# Patient Record
Sex: Female | Born: 1965
Health system: Southern US, Community
[De-identification: ages and names within clinical notes are randomized; demographics above are authoritative.]

## PROBLEM LIST (undated history)

## (undated) DIAGNOSIS — E785 Hyperlipidemia, unspecified: Secondary | ICD-10-CM

## (undated) DIAGNOSIS — T7840XA Allergy, unspecified, initial encounter: Secondary | ICD-10-CM

## (undated) DIAGNOSIS — M1990 Unspecified osteoarthritis, unspecified site: Secondary | ICD-10-CM

## (undated) HISTORY — DX: Unspecified osteoarthritis, unspecified site: M19.90

## (undated) HISTORY — DX: Allergy, unspecified, initial encounter: T78.40XA

## (undated) HISTORY — DX: Hyperlipidemia, unspecified: E78.5

## (undated) HISTORY — PX: TOTAL ABDOMINAL HYSTERECTOMY W/ BILATERAL SALPINGOOPHORECTOMY: SHX83

---

## 2015-03-16 ENCOUNTER — Ambulatory Visit (INDEPENDENT_AMBULATORY_CARE_PROVIDER_SITE_OTHER): Payer: BLUE CROSS/BLUE SHIELD | Admitting: Family Medicine

## 2015-03-16 ENCOUNTER — Encounter: Payer: Self-pay | Admitting: Family Medicine

## 2015-03-16 VITALS — BP 125/90 | HR 80 | Temp 98.4°F | Ht 67.0 in | Wt 189.0 lb

## 2015-03-16 DIAGNOSIS — H6090 Unspecified otitis externa, unspecified ear: Secondary | ICD-10-CM | POA: Insufficient documentation

## 2015-03-16 DIAGNOSIS — H6091 Unspecified otitis externa, right ear: Secondary | ICD-10-CM

## 2015-03-16 DIAGNOSIS — Z23 Encounter for immunization: Secondary | ICD-10-CM | POA: Diagnosis not present

## 2015-03-16 DIAGNOSIS — Z720 Tobacco use: Secondary | ICD-10-CM

## 2015-03-16 DIAGNOSIS — F17200 Nicotine dependence, unspecified, uncomplicated: Secondary | ICD-10-CM | POA: Insufficient documentation

## 2015-03-16 MED ORDER — CIPROFLOXACIN-DEXAMETHASONE 0.3-0.1 % OT SUSP: 4.0000 [drp] | Freq: Two times a day (BID) | OTIC | Status: AC

## 2015-03-16 NOTE — Progress Notes (Signed)
Erica Walters is a 49 y.o. female who presents to [facility]  today for right ear drainage. Symptoms have been present for a few days. She has mild pain. Symptoms are consistent with previous episodes of draining ear. She notes that she has a ruptured tympanic membrane chronically in the right ear and occasionally gets infected and causes drainage. She has not had any treatment. No fevers chills nausea vomiting or diarrhea.  She is due for a Tdap vaccine today and a flu vaccine today. She declines flu vaccine but excepts Tdap. She works in a pharmacy.   History reviewed. No pertinent past medical history. History reviewed. No pertinent past surgical history. Social History  Substance Use Topics  . Smoking status: Current Every Day Smoker  . Smokeless tobacco: Not on file  . Alcohol Use: 0.0 oz/week    0 Standard drinks or equivalent per week   family history is not on file.  ROS as above Medications: Current Outpatient Prescriptions  Medication Sig Dispense Refill  . ciprofloxacin-dexamethasone (CIPRODEX) otic suspension Place 4 drops into the right ear 2 (two) times daily. 1 mL 1  . Ibuprofen (ADVIL) 200 MG CAPS Take by mouth.     No current facility-administered medications for this visit.   No Known Allergies   Exam:  BP 125/90 mmHg  Pulse 80  Temp(Src) 98.4 F (36.9 C) (Oral)  Ht 5\' 7"  (1.702 m)  Wt 189 lb (85.73 kg)  BMI 29.59 kg/m2 Gen: Well NAD HEENT: EOMI,  MMM right ear yellowish drainage. Tympanic membrane partially visible. Left ear tympanic membranes and ear canals normal. Nontender mastoids bilaterally. Lungs: Normal work of breathing. CTABL Heart: RRR no MRG Abd: NABS, Soft. Nondistended, Nontender Exts: Brisk capillary refill, warm and well perfused.   No results found for this or any previous visit (from the past 24 hour(s)). No results found.   Please see individual assessment and plan sections.  Tdap vaccine  given prior to discharge

## 2015-03-16 NOTE — Patient Instructions (Signed)
Thank you for coming in today.' Use the ear drops as directed.  Call if not better.  Return in the near future for a wellness visit where we will also do a Pap smear.  Work on smoking cessation.  Welcome to the practice.    Otitis Externa Otitis externa is a bacterial or fungal infection of the outer ear canal. This is the area from the eardrum to the outside of the ear. Otitis externa is sometimes called "swimmer's ear." CAUSES  Possible causes of infection include:  Swimming in dirty water.  Moisture remaining in the ear after swimming or bathing.  Mild injury (trauma) to the ear.  Objects stuck in the ear (foreign body).  Cuts or scrapes (abrasions) on the outside of the ear. SIGNS AND SYMPTOMS  The first symptom of infection is often itching in the ear canal. Later signs and symptoms may include swelling and redness of the ear canal, ear pain, and yellowish-white fluid (pus) coming from the ear. The ear pain may be worse when pulling on the earlobe. DIAGNOSIS  Your health care provider will perform a physical exam. A sample of fluid may be taken from the ear and examined for bacteria or fungi. TREATMENT  Antibiotic ear drops are often given for 10 to 14 days. Treatment may also include pain medicine or corticosteroids to reduce itching and swelling. HOME CARE INSTRUCTIONS   Apply antibiotic ear drops to the ear canal as prescribed by your health care provider.  Take medicines only as directed by your health care provider.  If you have diabetes, follow any additional treatment instructions from your health care provider.  Keep all follow-up visits as directed by your health care provider. PREVENTION   Keep your ear dry. Use the corner of a towel to absorb water out of the ear canal after swimming or bathing.  Avoid scratching or putting objects inside your ear. This can damage the ear canal or remove the protective wax that lines the canal. This makes it easier for  bacteria and fungi to grow.  Avoid swimming in lakes, polluted water, or poorly chlorinated pools.  You may use ear drops made of rubbing alcohol and vinegar after swimming. Combine equal parts of white vinegar and alcohol in a bottle. Put 3 or 4 drops into each ear after swimming. SEEK MEDICAL CARE IF:   You have a fever.  Your ear is still red, swollen, painful, or draining pus after 3 days.  Your redness, swelling, or pain gets worse.  You have a severe headache.  You have redness, swelling, pain, or tenderness in the area behind your ear. MAKE SURE YOU:   Understand these instructions.  Will watch your condition.  Will get help right away if you are not doing well or get worse. Document Released: 06/11/2005 Document Revised: 10/26/2013 Document Reviewed: 06/28/2011 [facility] Patient Information 2015 [place], [place]. This information is not intended to replace advice given to you by your health care provider. Make sure you discuss any questions you have with your health care provider.

## 2015-03-16 NOTE — Assessment & Plan Note (Signed)
Treat with Ciprodex. Return if not better.

## 2015-03-16 NOTE — Assessment & Plan Note (Addendum)
Recommend smoking cessation.

## 2015-07-19 ENCOUNTER — Other Ambulatory Visit: Payer: Self-pay | Admitting: Family Medicine

## 2015-07-19 ENCOUNTER — Ambulatory Visit: Payer: BLUE CROSS/BLUE SHIELD | Admitting: Family Medicine

## 2015-07-19 ENCOUNTER — Ambulatory Visit (INDEPENDENT_AMBULATORY_CARE_PROVIDER_SITE_OTHER): Payer: BLUE CROSS/BLUE SHIELD | Admitting: Family Medicine

## 2015-07-19 VITALS — BP 127/80 | HR 92 | Wt 195.0 lb

## 2015-07-19 DIAGNOSIS — N949 Unspecified condition associated with female genital organs and menstrual cycle: Secondary | ICD-10-CM | POA: Diagnosis not present

## 2015-07-19 DIAGNOSIS — N9489 Other specified conditions associated with female genital organs and menstrual cycle: Secondary | ICD-10-CM

## 2015-07-19 DIAGNOSIS — R102 Pelvic and perineal pain: Secondary | ICD-10-CM

## 2015-07-19 DIAGNOSIS — R300 Dysuria: Secondary | ICD-10-CM

## 2015-07-19 LAB — POCT URINALYSIS DIPSTICK
BILIRUBIN UA: NEGATIVE
GLUCOSE UA: NEGATIVE
KETONES UA: NEGATIVE
LEUKOCYTES UA: NEGATIVE
NITRITE UA: NEGATIVE
Protein, UA: NEGATIVE
RBC UA: NEGATIVE
SPEC GRAV UA: 1.025
Urobilinogen, UA: 0.2
pH, UA: 6.5

## 2015-07-19 LAB — WET PREP FOR TRICH, YEAST, CLUE
Clue Cells Wet Prep HPF POC: NONE SEEN
Trich, Wet Prep: NONE SEEN
YEAST WET PREP: NONE SEEN

## 2015-07-19 MED ORDER — POLYETHYLENE GLYCOL 3350 17 GM/SCOOP PO POWD: 17.0000 g | Freq: Every day | ORAL | Status: DC

## 2015-07-19 NOTE — Patient Instructions (Addendum)
Thank you for coming in today. If your belly pain worsens, or you have high fever, bad vomiting, blood in your stool or black tarry stool go to the Emergency Room.  Take miralax for constipation.  Get labs today.  You should be contacted by the radiology department about the ultrasound.   Return if not better.    Abdominal Pain, Adult Many things can cause abdominal pain. Usually, abdominal pain is not caused by a disease and will improve without treatment. It can often be observed and treated at home. Your health care provider will do a physical exam and possibly order blood tests and X-rays to help determine the seriousness of your pain. However, in many cases, more time must pass before a clear cause of the pain can be found. Before that point, your health care provider may not know if you need more testing or further treatment. HOME CARE INSTRUCTIONS Monitor your abdominal pain for any changes. The following actions may help to alleviate any discomfort you are experiencing:  Only take over-the-counter or prescription medicines as directed by your health care provider.  Do not take laxatives unless directed to do so by your health care provider.  Try a clear liquid diet (broth, tea, or water) as directed by your health care provider. Slowly move to a bland diet as tolerated. SEEK MEDICAL CARE IF:  You have unexplained abdominal pain.  You have abdominal pain associated with nausea or diarrhea.  You have pain when you urinate or have a bowel movement.  You experience abdominal pain that wakes you in the night.  You have abdominal pain that is worsened or improved by eating food.  You have abdominal pain that is worsened with eating fatty foods.  You have a fever. SEEK IMMEDIATE MEDICAL CARE IF:  Your pain does not go away within 2 hours.  You keep throwing up (vomiting).  Your pain is felt only in portions of the abdomen, such as the right side or the left lower portion of  the abdomen.  You pass bloody or black tarry stools. MAKE SURE YOU:  Understand these instructions.  Will watch your condition.  Will get help right away if you are not doing well or get worse.   This information is not intended to replace advice given to you by your health care provider. Make sure you discuss any questions you have with your health care provider.   Document Released: 03/21/2005 Document Revised: 03/02/2015 Document Reviewed: 02/18/2013 Elsevier Interactive Patient Education [employer].

## 2015-07-19 NOTE — Assessment & Plan Note (Signed)
Pelvis discomfort and pain. Etiology is unclear. The uterus is large on bimanual exam. Plan for wet prep, GC chlamydia testing. Additionally obtain CBC, CMP, HIV and lipase and RPR. Additionally will obtain a pelvic ultrasound. Additionally we'll treat empirically for constipation with MiraLAX. Return in a week or so if not improved.

## 2015-07-19 NOTE — Progress Notes (Signed)
       Erica Walters is a 50 y.o. female who presents to [facility] today for abdominal pain. Patient notes a 2 day history of lower left pelvic pain and pressure. Symptoms occur worse after urinating. Patient denies urinary frequency urgency or pain with urination. She denies any vomiting or diarrhea. She's tried taking Gas-X which has not helped. She feels well otherwise. No fevers chills chest pain palpitations or shortness of breath. Symptoms are not consistent with previous episodes of urinary tract infections.  Patient has not had a bowel movement in 2 days which is not typical.    No past medical history on file. No past surgical history on file. Social History  Substance Use Topics  . Smoking status: Current Every Day Smoker  . Smokeless tobacco: Not on file  . Alcohol Use: 0.0 oz/week    0 Standard drinks or equivalent per week   family history is not on file.  ROS as above Medications: Current Outpatient Prescriptions  Medication Sig Dispense Refill  . polyethylene glycol powder (GLYCOLAX/MIRALAX) powder Take 17 g by mouth daily. 850 g 1   No current facility-administered medications for this visit.   No Known Allergies   Exam:  BP 127/80 mmHg  Pulse 92  Wt 195 lb (88.451 kg) Gen: Well NAD nontoxic appearing HEENT: EOMI,  MMM Lungs: Normal work of breathing. CTABL Heart: RRR no MRG Abd: NABS, Soft. Mildly distended, tender to palpation left lower quadrant with no rebound or guarding. No masses palpated. Exts: Brisk capillary refill, warm and well perfused.  GYN: Normal external genitalia. Vaginal canal with thin white discharge. Normal appearing cervix.  Bimanual exam with  large  uterus. Uterus size is estimated to be similar to a 10 or 12 week pregnancy. Uterus is not tender with palpation.  There is no adnexal masses palpated on exam however the exam is limited by  body habitus. The patient however is tender with palpation in the adnexal region bilaterally.   Results for orders placed or performed in visit on 07/19/15 (from the past 24 hour(s))  POCT Urinalysis Dipstick     Status: None   Collection Time: 07/19/15  2:13 PM  Result Value Ref Range   Color, UA yrllow    Clarity, UA clear    Glucose, UA neg    Bilirubin, UA neg    Ketones, UA neg    Spec Grav, UA 1.025    Blood, UA neg    pH, UA 6.5    Protein, UA neg    Urobilinogen, UA 0.2    Nitrite, UA neg    Leukocytes, UA Negative Negative   No results found.   Please see individual assessment and plan sections.

## 2015-07-20 ENCOUNTER — Ambulatory Visit (INDEPENDENT_AMBULATORY_CARE_PROVIDER_SITE_OTHER): Payer: BLUE CROSS/BLUE SHIELD

## 2015-07-20 ENCOUNTER — Ambulatory Visit (HOSPITAL_BASED_OUTPATIENT_CLINIC_OR_DEPARTMENT_OTHER)
Admission: RE | Admit: 2015-07-20 | Discharge: 2015-07-20 | Disposition: A | Discharge status: Home / Self Care | Payer: BLUE CROSS/BLUE SHIELD | Source: Ambulatory Visit | Attending: Family Medicine | Admitting: Family Medicine

## 2015-07-20 ENCOUNTER — Ambulatory Visit: Payer: BLUE CROSS/BLUE SHIELD

## 2015-07-20 ENCOUNTER — Encounter (HOSPITAL_BASED_OUTPATIENT_CLINIC_OR_DEPARTMENT_OTHER): Payer: Self-pay

## 2015-07-20 DIAGNOSIS — N9489 Other specified conditions associated with female genital organs and menstrual cycle: Secondary | ICD-10-CM | POA: Diagnosis not present

## 2015-07-20 DIAGNOSIS — R1032 Left lower quadrant pain: Secondary | ICD-10-CM | POA: Diagnosis not present

## 2015-07-20 DIAGNOSIS — R102 Pelvic and perineal pain: Secondary | ICD-10-CM

## 2015-07-20 DIAGNOSIS — N949 Unspecified condition associated with female genital organs and menstrual cycle: Secondary | ICD-10-CM | POA: Diagnosis not present

## 2015-07-20 DIAGNOSIS — R1900 Intra-abdominal and pelvic swelling, mass and lump, unspecified site: Secondary | ICD-10-CM | POA: Diagnosis not present

## 2015-07-20 LAB — COMPREHENSIVE METABOLIC PANEL
ALK PHOS: 83 U/L (ref 33–115)
ALT: 26 U/L (ref 6–29)
AST: 19 U/L (ref 10–35)
Albumin: 4.1 g/dL (ref 3.6–5.1)
BILIRUBIN TOTAL: 0.4 mg/dL (ref 0.2–1.2)
BUN: 10 mg/dL (ref 7–25)
CO2: 29 mmol/L (ref 20–31)
CREATININE: 0.83 mg/dL (ref 0.50–1.10)
Calcium: 8.9 mg/dL (ref 8.6–10.2)
Chloride: 107 mmol/L (ref 98–110)
Glucose, Bld: 99 mg/dL (ref 65–99)
POTASSIUM: 4.3 mmol/L (ref 3.5–5.3)
Sodium: 145 mmol/L (ref 135–146)
TOTAL PROTEIN: 6.1 g/dL (ref 6.1–8.1)

## 2015-07-20 LAB — CBC
HEMATOCRIT: 39.6 % (ref 36.0–46.0)
Hemoglobin: 13.1 g/dL (ref 12.0–15.0)
MCH: 32 pg (ref 26.0–34.0)
MCHC: 33.1 g/dL (ref 30.0–36.0)
MCV: 96.6 fL (ref 78.0–100.0)
MPV: 9.3 fL (ref 8.6–12.4)
PLATELETS: 285 10*3/uL (ref 150–400)
RBC: 4.1 MIL/uL (ref 3.87–5.11)
RDW: 13.5 % (ref 11.5–15.5)
WBC: 9.2 10*3/uL (ref 4.0–10.5)

## 2015-07-20 LAB — HIV ANTIBODY (ROUTINE TESTING W REFLEX): HIV: NONREACTIVE

## 2015-07-20 LAB — LIPASE: LIPASE: 30 U/L (ref 7–60)

## 2015-07-20 LAB — GC/CHLAMYDIA PROBE AMP
CT PROBE, AMP APTIMA: NOT DETECTED
GC Probe RNA: NOT DETECTED

## 2015-07-20 LAB — RPR

## 2015-07-20 MED ORDER — IOHEXOL 300 MG/ML  SOLN
100.0000 mL | Freq: Once | INTRAMUSCULAR | Status: AC | PRN
  Administered 2015-07-20: 100 mL via ORAL

## 2015-07-20 NOTE — Progress Notes (Signed)
Pelvis US done on 07/20/15 showing right adnexal mass concerning for ovarian cancer. CT scan of the abdomen and pelvis ordered and should be done today. We will call pt with results when back.    No results found for this or any previous visit (from the past 24 hour(s)). US Transvaginal Non-ob  07/20/2015  CLINICAL DATA:  Left lower quadrant pain EXAM: TRANSABDOMINAL AND TRANSVAGINAL ULTRASOUND OF PELVIS TECHNIQUE: Both transabdominal and transvaginal ultrasound examinations of the pelvis were performed. Transabdominal technique was performed for global imaging of the pelvis including uterus, ovaries, adnexal regions, and pelvic cul-de-sac. It was necessary to proceed with endovaginal exam following the transabdominal exam to visualize the adnexa. COMPARISON:  None FINDINGS: Uterus Measurements: 7.0 x 3.3 x 4.7 cm. No fibroids or other mass visualized. Endometrium Thickness: 3 mm.  No focal abnormality visualized. Right ovary Measurements: . Not visualized. Left ovary Measurements: . Not visualized. Other findings There is a cystic lesion in the right adnexa measuring 11.8 x 13.5 x 9.7 cm. It is predominantly anechoic but there are some solid elements along the wall IMPRESSION: Complex solid and cystic lesion in the right adnexa measures up to 13.5 cm. Ovarian carcinoma is not excluded. CT abdomen and pelvis with contrast is recommended to further characterize. Surgical consultation is recommended. Electronically Signed   By: Marybelle Killings M.D.   On: 07/20/2015 15:29   US Pelvis Complete  07/20/2015  CLINICAL DATA:  Left lower quadrant pain EXAM: TRANSABDOMINAL AND TRANSVAGINAL ULTRASOUND OF PELVIS TECHNIQUE: Both transabdominal and transvaginal ultrasound examinations of the pelvis were performed. Transabdominal technique was performed for global imaging of the pelvis including uterus, ovaries, adnexal regions, and pelvic cul-de-sac. It was necessary to proceed with endovaginal exam following the  transabdominal exam to visualize the adnexa. COMPARISON:  None FINDINGS: Uterus Measurements: 7.0 x 3.3 x 4.7 cm. No fibroids or other mass visualized. Endometrium Thickness: 3 mm.  No focal abnormality visualized. Right ovary Measurements: . Not visualized. Left ovary Measurements: . Not visualized. Other findings There is a cystic lesion in the right adnexa measuring 11.8 x 13.5 x 9.7 cm. It is predominantly anechoic but there are some solid elements along the wall IMPRESSION: Complex solid and cystic lesion in the right adnexa measures up to 13.5 cm. Ovarian carcinoma is not excluded. CT abdomen and pelvis with contrast is recommended to further characterize. Surgical consultation is recommended. Electronically Signed   By: Marybelle Killings M.D.   On: 07/20/2015 15:29

## 2015-07-20 NOTE — Addendum Note (Signed)
Addended by: Gregor Hams on: 07/20/2015 03:47 PM   Modules accepted: Orders, SmartSet

## 2015-07-20 NOTE — Addendum Note (Signed)
Addended by: Gregor Hams on: 07/20/2015 04:11 PM   Modules accepted: Orders, SmartSet

## 2015-07-20 NOTE — Progress Notes (Signed)
Quick Note:  All labs were negative. ______

## 2015-07-21 ENCOUNTER — Telehealth: Payer: Self-pay | Admitting: Family Medicine

## 2015-07-21 LAB — URINE CULTURE

## 2015-07-21 NOTE — Telephone Encounter (Signed)
I called the mobile and home numbers to give results regarding recent CT scan results. Awaiting call back.

## 2015-07-22 ENCOUNTER — Telehealth: Payer: Self-pay

## 2015-07-22 NOTE — Telephone Encounter (Signed)
Patient called and wants to know if there are things she should not do because of the ovarian cyst, such as lifting pulling etc. Please advise.

## 2015-07-22 NOTE — Telephone Encounter (Signed)
I would advise against heavy pushing or pulling or lifting. Most normal activities OK.

## 2015-07-22 NOTE — Telephone Encounter (Signed)
Patient advised of recommendations.  

## 2015-08-11 DIAGNOSIS — D271 Benign neoplasm of left ovary: Secondary | ICD-10-CM | POA: Insufficient documentation

## 2017-09-26 ENCOUNTER — Ambulatory Visit (INDEPENDENT_AMBULATORY_CARE_PROVIDER_SITE_OTHER): Payer: 59 | Admitting: Physician Assistant

## 2017-09-26 ENCOUNTER — Other Ambulatory Visit: Payer: Self-pay

## 2017-09-26 ENCOUNTER — Encounter: Payer: Self-pay | Admitting: Physician Assistant

## 2017-09-26 VITALS — BP 118/78 | HR 93 | Temp 98.1°F | Resp 16 | Ht 66.5 in | Wt 188.6 lb

## 2017-09-26 DIAGNOSIS — Z0000 Encounter for general adult medical examination without abnormal findings: Secondary | ICD-10-CM | POA: Diagnosis not present

## 2017-09-26 DIAGNOSIS — F17200 Nicotine dependence, unspecified, uncomplicated: Secondary | ICD-10-CM | POA: Diagnosis not present

## 2017-09-26 NOTE — Progress Notes (Signed)
Patient ID: Erica Walters MRN: [id number], DOB: 08/18/1965, 52 y.o. Date of Encounter: 09/26/2017,   Chief Complaint: New Patient, Establish Care / Physical (CPE)  HPI: 52 y.o. y/o female here to [facility] / Physical (CPE)  She reports that she and her husband moved from [place], [place] to [place] 9 years ago---moved to this area 3 years ago.  Never established PCP here -- husband finally made her come to get established.   She has no specific concerns to address today. She is agreeable to do CPE/ Update Preventive Care.   She reports her only known PMH is:   ---She smokes. 1/2 ppd--since ~ age 81. Is not ready to quit--"will start thinking about it"  --Had surgery 2 years ago--complete hysterectomy--"secondary to large cyst"  --One time went to [facility] secondary to neck pain--they did xray--told her she has osteoarthritis in neck  No other known medicla history.  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  Says her kids "are grown". Has 4 grandkids. Son in [place]. Dtr in [place].     Review of Systems: Consitutional: No fever, chills, fatigue, night sweats, lymphadenopathy. No significant/unexplained weight changes. Eyes: No visual changes, eye redness, or discharge. ENT/Mouth: No ear pain, sore throat, nasal drainage, or sinus pain. Cardiovascular: No chest pressure,heaviness, tightness or squeezing, even with exertion. No increased shortness of breath or dyspnea on exertion.No palpitations, edema, orthopnea, PND. Respiratory: No cough, hemoptysis, SOB, or wheezing. Gastrointestinal: No anorexia, dysphagia, reflux, pain, nausea, vomiting, hematemesis, diarrhea, constipation, BRBPR, or melena. Breast: No mass, nodules, bulging, or retraction. No skin changes or inflammation. No nipple discharge. No lymphadenopathy. Genitourinary: No dysuria, hematuria, incontinence, vaginal discharge, pruritis, burning, abnormal bleeding, or  pain. Musculoskeletal: No decreased ROM, No joint pain or swelling. No significant pain in neck, back, or extremities. Skin: No rash, pruritis, or concerning lesions. Neurological: No headache, dizziness, syncope, seizures, tremors, memory loss, coordination problems, or paresthesias. Psychological: No anxiety, depression, hallucinations, SI/HI. Endocrine: No polydipsia, polyphagia, polyuria, or known diabetes.No increased fatigue. No palpitations/rapid heart rate. No significant/unexplained weight change. All other systems were reviewed and are otherwise negative.  No past medical history on file.   Past Surgical History:  Procedure Laterality Date  . TOTAL ABDOMINAL HYSTERECTOMY W/ BILATERAL SALPINGOOPHORECTOMY      Home Meds:  Outpatient Medications Prior to Visit  Medication Sig Dispense Refill  . polyethylene glycol powder (GLYCOLAX/MIRALAX) powder Take 17 g by mouth daily. 850 g 1   No facility-administered medications prior to visit.     Allergies: No Known Allergies  Social History   Socioeconomic History  . Marital status: Married    Spouse name: Not on file  . Number of children: Not on file  . Years of education: Not on file  . Highest education level: Not on file  Occupational History  . Not on file  Social Needs  . Financial resource strain: Not on file  . Food insecurity:    Worry: Not on file    Inability: Not on file  . Transportation needs:    Medical: Not on file    Non-medical: Not on file  Tobacco Use  . Smoking status: Current Every Day Smoker  . Smokeless tobacco: Never Used  Substance and Sexual Activity  . Alcohol use: Yes    Alcohol/week: 0.0 oz  . Drug use: Yes  . Sexual activity: Not Currently  Lifestyle  . Physical activity:  Days per week: Not on file    Minutes per session: Not on file  . Stress: Not on file  Relationships  . Social connections:    Talks on phone: Not on file    Gets together: Not on file    Attends religious  service: Not on file    Active member of club or organization: Not on file    Attends meetings of clubs or organizations: Not on file    Relationship status: Not on file  . Intimate partner violence:    Fear of current or ex partner: Not on file    Emotionally abused: Not on file    Physically abused: Not on file    Forced sexual activity: Not on file  Other Topics Concern  . Not on file  Social History Narrative  . Not on file    History reviewed. No pertinent family history.  Physical Exam: Blood pressure 118/78, pulse 93, temperature 98.1 F (36.7 C), temperature source Oral, resp. rate 16, height 5' 6.5" (1.689 m), weight 85.5 kg (188 lb 9.6 oz), SpO2 97 %., Body mass index is 29.98 kg/m. General: Well developed, well nourished WF. Appears in no acute distress. HEENT: Normocephalic, atraumatic. Conjunctiva pink, sclera non-icteric. Pupils 2 mm constricting to 1 mm, round, regular, and equally reactive to light and accomodation. EOMI. Internal auditory canal clear. TMs with good cone of light and without pathology. Nasal mucosa pink. Nares are without discharge. No sinus tenderness. Oral mucosa pink. Neck: Supple. Trachea midline. No thyromegaly. Full ROM. No lymphadenopathy.No Carotid Bruits. Lungs: Clear to auscultation bilaterally without wheezes, rales, or rhonchi. Breathing is of normal effort and unlabored. Cardiovascular: RRR with S1 S2. No murmurs, rubs, or gallops. Distal pulses 2+ symmetrically. No carotid or abdominal bruits. Abdomen: Soft, non-tender, non-distended with normoactive bowel sounds. No hepatosplenomegaly or masses. No rebound/guarding. No CVA tenderness. No hernias.  Musculoskeletal: Full range of motion and 5/5 strength throughout.  Skin: Warm and moist without erythema, ecchymosis, wounds, or rash. Neuro: A+Ox3. CN II-XII grossly intact. Moves all extremities spontaneously. Full sensation throughout. Normal gait.  Psych:  Responds to questions appropriately  with a normal affect.   Assessment/Plan:  52 y.o. y/o female here for CPE   1. Encounter for medical examination to establish care  2. Encounter for preventive health examination A. Screening Labs: She is not fasting today but can/will return fasting for labs tomorrow morning. - CBC with Differential/Platelet; Future - COMPLETE METABOLIC PANEL WITH GFR; Future - Lipid panel; Future - TSH; Future  B. Pap: -- No  Pap indicated---she has had complete hysterectomy--not secondary to cancer.  C. Screening Mammogram: She has had no mammogram since moving here. Agreeable for me to schedule - MM DIGITAL SCREENING BILATERAL; Future  D. DEXA/BMD:  --will start these closer to age 14---her hysterectomy wasn't until ~ age 44   E. Colorectal Cancer Screening: She has never had colonoscopy or any type of screening--agreeable to go for colonoscopy - Ambulatory referral to Gastroenterology    F. Immunizations:  Influenza:-----------------N/A Tetanus:-----------------She received Tdap 03/16/2015 Pneumococcal:---------She is a smoker. Needs Pneumovax 23--she defers/refuses at this time Shingrix:-------------------She defers/ refuses at this time   3. Smoker Discussed need for cessation. She is not mentally ready to quit--she will f/u when she is ready to quit.     [address] [place], [place], [facility] 09/26/2017 3:31 PM

## 2017-09-27 ENCOUNTER — Other Ambulatory Visit: Payer: 59

## 2017-09-27 ENCOUNTER — Encounter: Payer: Self-pay | Admitting: Gastroenterology

## 2017-09-27 DIAGNOSIS — Z0000 Encounter for general adult medical examination without abnormal findings: Secondary | ICD-10-CM

## 2017-09-27 LAB — CBC WITH DIFFERENTIAL/PLATELET
BASOS PCT: 0.7 %
Basophils Absolute: 60 cells/uL (ref 0–200)
Eosinophils Absolute: 138 cells/uL (ref 15–500)
Eosinophils Relative: 1.6 %
HCT: 43.4 % (ref 35.0–45.0)
Hemoglobin: 14.6 g/dL (ref 11.7–15.5)
Lymphs Abs: 2821 cells/uL (ref 850–3900)
MCH: 33 pg (ref 27.0–33.0)
MCHC: 33.6 g/dL (ref 32.0–36.0)
MCV: 98 fL (ref 80.0–100.0)
MONOS PCT: 8.6 %
MPV: 9.2 fL (ref 7.5–12.5)
Neutro Abs: 4842 cells/uL (ref 1500–7800)
Neutrophils Relative %: 56.3 %
PLATELETS: 306 10*3/uL (ref 140–400)
RBC: 4.43 10*6/uL (ref 3.80–5.10)
RDW: 12.6 % (ref 11.0–15.0)
TOTAL LYMPHOCYTE: 32.8 %
WBC mixed population: 740 cells/uL (ref 200–950)
WBC: 8.6 10*3/uL (ref 3.8–10.8)

## 2017-09-27 LAB — COMPLETE METABOLIC PANEL WITH GFR
AG RATIO: 1.6 (calc) (ref 1.0–2.5)
ALBUMIN MSPROF: 4.2 g/dL (ref 3.6–5.1)
ALKALINE PHOSPHATASE (APISO): 99 U/L (ref 33–130)
ALT: 28 U/L (ref 6–29)
AST: 20 U/L (ref 10–35)
BUN: 13 mg/dL (ref 7–25)
CO2: 29 mmol/L (ref 20–32)
CREATININE: 0.79 mg/dL (ref 0.50–1.05)
Calcium: 9.7 mg/dL (ref 8.6–10.4)
Chloride: 108 mmol/L (ref 98–110)
GFR, EST NON AFRICAN AMERICAN: 87 mL/min/{1.73_m2} (ref >=60)
GFR, Est African American: 100 mL/min/{1.73_m2} (ref >=60)
GLOBULIN: 2.7 g/dL (ref 1.9–3.7)
Glucose, Bld: 96 mg/dL (ref 65–99)
POTASSIUM: 5.6 mmol/L — AB (ref 3.5–5.3)
SODIUM: 145 mmol/L (ref 135–146)
Total Bilirubin: 0.4 mg/dL (ref 0.2–1.2)
Total Protein: 6.9 g/dL (ref 6.1–8.1)

## 2017-09-27 LAB — LIPID PANEL
CHOL/HDL RATIO: 5.3 (calc) — AB (ref <5.0)
CHOLESTEROL: 271 mg/dL — AB (ref <200)
HDL: 51 mg/dL (ref >50)
LDL CHOLESTEROL (CALC): 186 mg/dL — AB
Non-HDL Cholesterol (Calc): 220 mg/dL (calc) — ABNORMAL HIGH (ref <130)
Triglycerides: 174 mg/dL — ABNORMAL HIGH (ref <150)

## 2017-09-27 LAB — TSH: TSH: 1.67 m[IU]/L

## 2017-10-08 ENCOUNTER — Ambulatory Visit
Admission: RE | Admit: 2017-10-08 | Discharge: 2017-10-08 | Disposition: A | Discharge status: Home / Self Care | Payer: 59 | Source: Ambulatory Visit | Attending: Physician Assistant | Admitting: Physician Assistant

## 2017-10-08 ENCOUNTER — Other Ambulatory Visit: Payer: Self-pay

## 2017-10-08 DIAGNOSIS — E782 Mixed hyperlipidemia: Secondary | ICD-10-CM

## 2017-10-08 DIAGNOSIS — Z0000 Encounter for general adult medical examination without abnormal findings: Secondary | ICD-10-CM

## 2017-10-08 DIAGNOSIS — Z1231 Encounter for screening mammogram for malignant neoplasm of breast: Secondary | ICD-10-CM | POA: Diagnosis not present

## 2017-11-20 ENCOUNTER — Ambulatory Visit (AMBULATORY_SURGERY_CENTER): Payer: Self-pay

## 2017-11-20 ENCOUNTER — Other Ambulatory Visit: Payer: Self-pay

## 2017-11-20 VITALS — Ht 66.5 in | Wt 198.8 lb

## 2017-11-20 DIAGNOSIS — Z1211 Encounter for screening for malignant neoplasm of colon: Secondary | ICD-10-CM

## 2017-11-20 MED ORDER — PEG-KCL-NACL-NASULF-NA ASC-C 140 G PO SOLR: 1.0000 | Freq: Once | ORAL | 0 refills | Status: AC

## 2017-11-20 NOTE — Progress Notes (Signed)
Denies allergies to eggs or soy products. Denies complication of anesthesia or sedation. Denies use of weight loss medication. Denies use of O2.   Emmi instructions declined.  

## 2017-11-29 ENCOUNTER — Encounter: Payer: Self-pay | Admitting: Gastroenterology

## 2017-12-04 ENCOUNTER — Other Ambulatory Visit: Payer: Self-pay

## 2017-12-04 ENCOUNTER — Encounter: Payer: Self-pay | Admitting: Gastroenterology

## 2017-12-04 ENCOUNTER — Ambulatory Visit (AMBULATORY_SURGERY_CENTER): Payer: 59 | Admitting: Gastroenterology

## 2017-12-04 VITALS — BP 147/90 | HR 93 | Temp 98.2°F | Resp 18 | Ht 66.5 in | Wt 188.0 lb

## 2017-12-04 DIAGNOSIS — D125 Benign neoplasm of sigmoid colon: Secondary | ICD-10-CM

## 2017-12-04 DIAGNOSIS — D123 Benign neoplasm of transverse colon: Secondary | ICD-10-CM

## 2017-12-04 DIAGNOSIS — D128 Benign neoplasm of rectum: Secondary | ICD-10-CM | POA: Diagnosis not present

## 2017-12-04 DIAGNOSIS — Z1211 Encounter for screening for malignant neoplasm of colon: Secondary | ICD-10-CM | POA: Diagnosis present

## 2017-12-04 DIAGNOSIS — D122 Benign neoplasm of ascending colon: Secondary | ICD-10-CM | POA: Diagnosis not present

## 2017-12-04 DIAGNOSIS — D124 Benign neoplasm of descending colon: Secondary | ICD-10-CM | POA: Diagnosis not present

## 2017-12-04 DIAGNOSIS — D129 Benign neoplasm of anus and anal canal: Secondary | ICD-10-CM

## 2017-12-04 HISTORY — PX: COLONOSCOPY: SHX174

## 2017-12-04 MED ORDER — SODIUM CHLORIDE 0.9 % IV SOLN: 500.0000 mL | Freq: Once | INTRAVENOUS | Status: DC

## 2017-12-04 NOTE — Op Note (Addendum)
[facility] Patient Name: Erica Walters Procedure Date: 12/04/2017 8:45 AM MRN: [id number] Endoscopist: Mallie Mussel L. Loletha Carrow , MD Age: 52 Referring MD:  Date of Birth: 1965/08/03 Gender: Female Account #: [id number] Procedure:                Colonoscopy Indications:              Screening for colorectal malignant neoplasm, This                            is the patient's first colonoscopy Medicines:                Monitored Anesthesia Care Procedure:                Pre-Anesthesia Assessment:                           - Prior to the procedure, a History and Physical                            was performed, and patient medications and                            allergies were reviewed. The patient's tolerance of                            previous anesthesia was also reviewed. The risks                            and benefits of the procedure and the sedation                            options and risks were discussed with the patient.                            All questions were answered, and informed consent                            was obtained. Prior Anticoagulants: The patient has                            taken no previous anticoagulant or antiplatelet                            agents. ASA Grade Assessment: II - A patient with                            mild systemic disease. After reviewing the risks                            and benefits, the patient was deemed in                            satisfactory condition to undergo the procedure.  After obtaining informed consent, the colonoscope                            was passed under direct vision. Throughout the                            procedure, the patient's blood pressure, pulse, and                            oxygen saturations were monitored continuously. The                            Colonoscope was introduced through the anus and                            advanced to the the cecum,  identified by                            appendiceal orifice and ileocecal valve. The                            colonoscopy was performed with difficulty due to                            the patient's agitation and coughing. The patient                            tolerated the procedure. The quality of the bowel                            preparation was good. The quality of the bowel                            preparation was evaluated using the BBPS [facility]                            Bowel Preparation Scale) with scores of: Right                            Colon = 2, Transverse Colon = 2 and Left Colon = 2.                            The total BBPS score equals 6. The bowel                            preparation used was Plenvu. Scope In: 8:56:40 AM Scope Out: 9:26:17 AM Scope Withdrawal Time: 0 hours 26 minutes 55 seconds  Total Procedure Duration: 0 hours 29 minutes 37 seconds  Findings:                 The perianal and digital rectal examinations were                            normal.  Three sessile polyps were found in the transverse                            colon and ascending colon. The polyps were 6 to 10                            mm in size. These polyps were removed with a cold                            snare. Resection and retrieval were complete.                           Two sessile polyps were found in the sigmoid colon                            and descending colon. The polyps were 4 to 6 mm in                            size.                           Four sessile polyps were found in the rectum. The                            polyps were 4 to 12 mm in size. These polyps were                            removed with a hot snare (2) and cold snare (1).                            Resection and retrieval were complete.                           The exam was otherwise without abnormality on                            direct and retroflexion  views. Complications:            No immediate complications. Estimated Blood Loss:     Estimated blood loss was minimal. Impression:               - Three 6 to 10 mm polyps in the transverse colon                            and in the ascending colon, removed with a cold                            snare. Resected and retrieved.                           - Two 4 to 6 mm polyps in the sigmoid colon and in  the descending colon.                           - Four 4 to 12 mm polyps in the rectum, removed                            with a hot snare. Resected and retrieved.                           - The examination was otherwise normal on direct                            and retroflexion views. Recommendation:           - Patient has a contact number available for                            emergencies. The signs and symptoms of potential                            delayed complications were discussed with the                            patient. Return to normal activities tomorrow.                            Written discharge instructions were provided to the                            patient.                           - Resume previous diet.                           - Continue present medications.                           - Await pathology results.                           - Repeat colonoscopy is recommended for                            surveillance. The colonoscopy date will be                            determined after pathology results from today's                            exam become available for review. Elyon Zoll L. Loletha Carrow, MD 12/04/2017 9:36:01 AM This report has been signed electronically.

## 2017-12-04 NOTE — Progress Notes (Signed)
Report given to PACU, vss 

## 2017-12-04 NOTE — Progress Notes (Signed)
Called to room to assist during endoscopic procedure.  Patient ID and intended procedure confirmed with present staff. Received instructions for my participation in the procedure from the performing physician.  

## 2017-12-04 NOTE — Patient Instructions (Signed)
YOU HAD AN ENDOSCOPIC PROCEDURE TODAY AT THE [facility]:   Refer to the procedure report that was given to you for any specific questions about what was found during the examination.  If the procedure report does not answer your questions, please call your gastroenterologist to clarify.  If you requested that your care partner not be given the details of your procedure findings, then the procedure report has been included in a sealed envelope for you to review at your convenience later.  YOU SHOULD EXPECT: Some feelings of bloating in the abdomen. Passage of more gas than usual.  Walking can help get rid of the air that was put into your GI tract during the procedure and reduce the bloating. If you had a lower endoscopy (such as a colonoscopy or flexible sigmoidoscopy) you may notice spotting of blood in your stool or on the toilet paper. If you underwent a bowel prep for your procedure, you may not have a normal bowel movement for a few days.  Please Note:  You might notice some irritation and congestion in your nose or some drainage.  This is from the oxygen used during your procedure.  There is no need for concern and it should clear up in a day or so.  SYMPTOMS TO REPORT IMMEDIATELY:   Following lower endoscopy (colonoscopy or flexible sigmoidoscopy):  Excessive amounts of blood in the stool  Significant tenderness or worsening of abdominal pains  Swelling of the abdomen that is new, acute  Fever of 100F or higher  Please see handouts given to you on Polyps.  For urgent or emergent issues, a gastroenterologist can be reached at any hour by calling [phone].   DIET:  We do recommend a small meal at first, but then you may proceed to your regular diet.  Drink plenty of fluids but you should avoid alcoholic beverages for 24 hours.  ACTIVITY:  You should plan to take it easy for the rest of today and you should NOT DRIVE or use heavy machinery until tomorrow (because of the  sedation medicines used during the test).    FOLLOW UP: Our staff will call the number listed on your records the next business day following your procedure to check on you and address any questions or concerns that you may have regarding the information given to you following your procedure. If we do not reach you, we will leave a message.  However, if you are feeling well and you are not experiencing any problems, there is no need to return our call.  We will assume that you have returned to your regular daily activities without incident.  If any biopsies were taken you will be contacted by phone or by letter within the next 1-3 weeks.  Please call us at [phone] if you have not heard about the biopsies in 3 weeks.    SIGNATURES/CONFIDENTIALITY: You and/or your care partner have signed paperwork which will be entered into your electronic medical record.  These signatures attest to the fact that that the information above on your After Visit Summary has been reviewed and is understood.  Full responsibility of the confidentiality of this discharge information lies with you and/or your care-partner.  Thank you for letting us take care of your healthcare needs today. 

## 2017-12-04 NOTE — Progress Notes (Signed)
Pt's states no medical or surgical changes since previsit or office visit. 

## 2017-12-05 ENCOUNTER — Telehealth: Payer: Self-pay | Admitting: *Deleted

## 2017-12-05 NOTE — Telephone Encounter (Signed)
  Follow up Call-  Call back number 12/04/2017  Post procedure Call Back phone  # [phone]  Permission to leave phone message Yes     Patient questions:  Do you have a fever, pain , or abdominal swelling? No. Pain Score  0 *  Have you tolerated food without any problems? Yes.    Have you been able to return to your normal activities? Yes.    Do you have any questions about your discharge instructions: Diet   No. Medications  No. Follow up visit  No.  Do you have questions or concerns about your Care? No.  Actions: * If pain score is 4 or above: No action needed, pain <4.

## 2017-12-11 ENCOUNTER — Encounter: Payer: Self-pay | Admitting: Gastroenterology

## 2018-04-09 ENCOUNTER — Other Ambulatory Visit: Payer: 59

## 2018-04-09 DIAGNOSIS — E782 Mixed hyperlipidemia: Secondary | ICD-10-CM | POA: Diagnosis not present

## 2018-04-09 LAB — LIPID PANEL
CHOLESTEROL: 240 mg/dL — AB (ref <200)
HDL: 42 mg/dL — AB (ref >50)
LDL Cholesterol (Calc): 157 mg/dL (calc) — ABNORMAL HIGH
NON-HDL CHOLESTEROL (CALC): 198 mg/dL — AB (ref <130)
TRIGLYCERIDES: 251 mg/dL — AB (ref <150)
Total CHOL/HDL Ratio: 5.7 (calc) — ABNORMAL HIGH (ref <5.0)

## 2018-04-09 LAB — HEPATIC FUNCTION PANEL
AG RATIO: 1.7 (calc) (ref 1.0–2.5)
ALKALINE PHOSPHATASE (APISO): 84 U/L (ref 33–130)
ALT: 33 U/L — AB (ref 6–29)
AST: 24 U/L (ref 10–35)
Albumin: 4.1 g/dL (ref 3.6–5.1)
Bilirubin, Direct: 0.1 mg/dL (ref 0.0–0.2)
Globulin: 2.4 g/dL (calc) (ref 1.9–3.7)
Indirect Bilirubin: 0.2 mg/dL (calc) (ref 0.2–1.2)
TOTAL PROTEIN: 6.5 g/dL (ref 6.1–8.1)
Total Bilirubin: 0.3 mg/dL (ref 0.2–1.2)

## 2018-04-17 ENCOUNTER — Other Ambulatory Visit: Payer: Self-pay

## 2018-04-17 DIAGNOSIS — E782 Mixed hyperlipidemia: Secondary | ICD-10-CM

## 2018-04-17 MED ORDER — ATORVASTATIN CALCIUM 20 MG PO TABS: 20.0000 mg | ORAL_TABLET | Freq: Every day | ORAL | 1 refills | Status: DC

## 2018-06-25 HISTORY — PX: COLONOSCOPY: SHX174

## 2018-07-18 ENCOUNTER — Other Ambulatory Visit: Payer: 59

## 2018-07-18 DIAGNOSIS — E782 Mixed hyperlipidemia: Secondary | ICD-10-CM

## 2018-07-19 LAB — LIPID PANEL
Cholesterol: 173 mg/dL (ref <200)
HDL: 28 mg/dL — ABNORMAL LOW (ref >50)
LDL Cholesterol (Calc): 115 mg/dL (calc) — ABNORMAL HIGH
NON-HDL CHOLESTEROL (CALC): 145 mg/dL — AB (ref <130)
Total CHOL/HDL Ratio: 6.2 (calc) — ABNORMAL HIGH (ref <5.0)
Triglycerides: 186 mg/dL — ABNORMAL HIGH (ref <150)

## 2018-07-19 LAB — HEPATIC FUNCTION PANEL
AG RATIO: 1.7 (calc) (ref 1.0–2.5)
ALKALINE PHOSPHATASE (APISO): 80 U/L (ref 33–130)
ALT: 27 U/L (ref 6–29)
AST: 23 U/L (ref 10–35)
Albumin: 4 g/dL (ref 3.6–5.1)
BILIRUBIN DIRECT: 0.1 mg/dL (ref 0.0–0.2)
BILIRUBIN INDIRECT: 0.2 mg/dL (ref 0.2–1.2)
BILIRUBIN TOTAL: 0.3 mg/dL (ref 0.2–1.2)
Globulin: 2.4 g/dL (calc) (ref 1.9–3.7)
Total Protein: 6.4 g/dL (ref 6.1–8.1)

## 2018-07-25 ENCOUNTER — Encounter: Payer: Self-pay | Admitting: Family Medicine

## 2018-07-25 ENCOUNTER — Ambulatory Visit
Admission: RE | Admit: 2018-07-25 | Discharge: 2018-07-25 | Disposition: A | Discharge status: Home / Self Care | Payer: 59 | Source: Ambulatory Visit | Attending: Family Medicine | Admitting: Family Medicine

## 2018-07-25 ENCOUNTER — Ambulatory Visit: Payer: 59 | Admitting: Family Medicine

## 2018-07-25 VITALS — BP 110/70 | HR 88 | Temp 98.1°F | Resp 14 | Ht 66.5 in | Wt 185.1 lb

## 2018-07-25 DIAGNOSIS — M5442 Lumbago with sciatica, left side: Secondary | ICD-10-CM | POA: Diagnosis not present

## 2018-07-25 DIAGNOSIS — M5412 Radiculopathy, cervical region: Secondary | ICD-10-CM | POA: Diagnosis not present

## 2018-07-25 DIAGNOSIS — M549 Dorsalgia, unspecified: Secondary | ICD-10-CM

## 2018-07-25 DIAGNOSIS — M5030 Other cervical disc degeneration, unspecified cervical region: Secondary | ICD-10-CM | POA: Diagnosis not present

## 2018-07-25 DIAGNOSIS — M542 Cervicalgia: Secondary | ICD-10-CM | POA: Diagnosis not present

## 2018-07-25 MED ORDER — PREDNISONE 20 MG PO TABS: 40.0000 mg | ORAL_TABLET | Freq: Every day | ORAL | 0 refills | Status: DC

## 2018-07-25 MED ORDER — PREGABALIN 75 MG PO CAPS: 75.0000 mg | ORAL_CAPSULE | Freq: Two times a day (BID) | ORAL | 0 refills | Status: DC
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
## 2018-07-25 NOTE — Patient Instructions (Addendum)
Go to get Xrays  Start lyrica and follow up with me in 2-4 weeks  Hold ibuprofen for the next couple days while taking prednisone for sciatica   Cervical Radiculopathy  Cervical radiculopathy means that a nerve in the neck is pinched or bruised. This can cause pain or loss of feeling (numbness) that runs from your neck to your arm and fingers. Follow these instructions at home: Managing pain  Take over-the-counter and prescription medicines only as told by your doctor.  If directed, put ice on the injured or painful area. ? Put ice in a plastic bag. ? Place a towel between your skin and the bag. ? Leave the ice on for 20 minutes, 2-3 times per day.  If ice does not help, you can try using heat. Take a warm shower or warm bath, or use a heat pack as told by your doctor.  You may try a gentle neck and shoulder massage. Activity  Rest as needed. Follow instructions from your doctor about any activities to avoid.  Do exercises as told by your doctor or physical therapist. General instructions  If you were given a soft collar, wear it as told by your doctor.  Use a flat pillow when you sleep.  Keep all follow-up visits as told by your doctor. This is important. Contact a doctor if:  Your condition does not improve with treatment. Get help right away if:  Your pain gets worse and is not controlled with medicine.  You lose feeling or feel weak in your hand, arm, face, or leg.  You have a fever.  You have a stiff neck.  You cannot control when you poop or pee (have incontinence).  You have trouble with walking, balance, or talking. This information is not intended to replace advice given to you by your health care provider. Make sure you discuss any questions you have with your health care provider. Document Released: 05/31/2011 Document Revised: 11/17/2015 Document Reviewed: 08/05/2014 Elsevier Interactive Patient Education  2019 Elsevier  Inc.    Sciatica  Sciatica is pain, numbness, weakness, or tingling along your sciatic nerve. The sciatic nerve starts in the lower back and goes down the back of each leg. Sciatica happens when this nerve is pinched or has pressure put on it. Sciatica usually goes away on its own or with treatment. Sometimes, sciatica may keep coming back (recur). Follow these instructions at home: Medicines  Take over-the-counter and prescription medicines only as told by your doctor.  Do not drive or use heavy machinery while taking prescription pain medicine. Managing pain  If directed, put ice on the affected area. ? Put ice in a plastic bag. ? Place a towel between your skin and the bag. ? Leave the ice on for 20 minutes, 2-3 times a day.  After icing, apply heat to the affected area before you exercise or as often as told by your doctor. Use the heat source that your doctor tells you to use, such as a moist heat pack or a heating pad. ? Place a towel between your skin and the heat source. ? Leave the heat on for 20-30 minutes. ? Remove the heat if your skin turns bright red. This is especially important if you are unable to feel pain, heat, or cold. You may have a greater risk of getting burned. Activity  Return to your normal activities as told by your doctor. Ask your doctor what activities are safe for you. ? Avoid activities that make your sciatica  worse.  Take short rests during the day. Rest in a lying or standing position. This is usually better than sitting to rest. ? When you rest for a long time, do some physical activity or stretching between periods of rest. ? Avoid sitting for a long time without moving. Get up and move around at least one time each hour.  Exercise and stretch regularly, as told by your doctor.  Do not lift anything that is heavier than 10 lb (4.5 kg) while you have symptoms of sciatica. ? Avoid lifting heavy things even when you do not have symptoms. ? Avoid  lifting heavy things over and over.  When you lift objects, always lift in a way that is safe for your body. To do this, you should: ? Bend your knees. ? Keep the object close to your body. ? Avoid twisting. General instructions  Use good posture. ? Avoid leaning forward when you are sitting. ? Avoid hunching over when you are standing.  Stay at a healthy weight.  Wear comfortable shoes that support your feet. Avoid wearing high heels.  Avoid sleeping on a mattress that is too soft or too hard. You might have less pain if you sleep on a mattress that is firm enough to support your back.  Keep all follow-up visits as told by your doctor. This is important. Contact a doctor if:  You have pain that: ? Wakes you up when you are sleeping. ? Gets worse when you lie down. ? Is worse than the pain you have had in the past. ? Lasts longer than 4 weeks.  You lose weight for without trying. Get help right away if:  You cannot control when you pee (urinate) or poop (have a bowel movement).  You have weakness in any of these areas and it gets worse. ? Lower back. ? Lower belly (pelvis). ? Butt (buttocks). ? Legs.  You have redness or swelling of your back.  You have a burning feeling when you pee. This information is not intended to replace advice given to you by your health care provider. Make sure you discuss any questions you have with your health care provider. Document Released: 03/20/2008 Document Revised: 11/17/2015 Document Reviewed: 02/18/2015 Elsevier Interactive Patient Education  2019 [employer].

## 2018-07-28 ENCOUNTER — Telehealth: Payer: Self-pay | Admitting: *Deleted

## 2018-07-28 NOTE — Telephone Encounter (Signed)
Received call from pharmacy.   Reports that [facility], but patient requested it sent to [facility].   [facility]. Patient has not picked up prescription at this time. Advised to cancel and put prescription back.   Lyrica called to [facility] per protocol.

## 2018-07-29 ENCOUNTER — Telehealth: Payer: Self-pay | Admitting: Family Medicine

## 2018-07-29 DIAGNOSIS — M5030 Other cervical disc degeneration, unspecified cervical region: Secondary | ICD-10-CM | POA: Insufficient documentation

## 2018-07-29 DIAGNOSIS — M5412 Radiculopathy, cervical region: Secondary | ICD-10-CM | POA: Insufficient documentation

## 2018-07-29 MED ORDER — PREDNISONE 20 MG PO TABS: 40.0000 mg | ORAL_TABLET | Freq: Every day | ORAL | 0 refills | Status: AC

## 2018-07-29 NOTE — Telephone Encounter (Signed)
Can you please resend in patient's Lyrica to [facility] on [address]. [facility] [place] [facility] [address] on Friday.

## 2018-08-01 ENCOUNTER — Telehealth: Payer: Self-pay | Admitting: Family Medicine

## 2018-08-01 NOTE — Telephone Encounter (Signed)
PATIENT CALLING TO SAY THAT THE [facility] THAT WE REFERRED HER TOO HAS NOT HEARD FROM [name]  WOULD LIKE A CALL BACK AT [phone]

## 2018-08-01 NOTE — Telephone Encounter (Signed)
Erica Walters states she will call patient as Spine and Scoliosis specialist has referral but needs the referral from [facility].

## 2018-08-07 DIAGNOSIS — S46091A Other injury of muscle(s) and tendon(s) of the rotator cuff of right shoulder, initial encounter: Secondary | ICD-10-CM | POA: Diagnosis not present

## 2018-08-07 DIAGNOSIS — M47812 Spondylosis without myelopathy or radiculopathy, cervical region: Secondary | ICD-10-CM | POA: Diagnosis not present

## 2018-08-07 DIAGNOSIS — Z6832 Body mass index (BMI) 32.0-32.9, adult: Secondary | ICD-10-CM | POA: Diagnosis not present

## 2018-08-08 ENCOUNTER — Encounter: Payer: Self-pay | Admitting: Physical Therapy

## 2018-08-08 ENCOUNTER — Ambulatory Visit: Payer: 59 | Attending: Family Medicine | Admitting: Physical Therapy

## 2018-08-08 DIAGNOSIS — M542 Cervicalgia: Secondary | ICD-10-CM | POA: Diagnosis not present

## 2018-08-08 DIAGNOSIS — M5442 Lumbago with sciatica, left side: Secondary | ICD-10-CM | POA: Diagnosis not present

## 2018-08-08 DIAGNOSIS — M25512 Pain in left shoulder: Secondary | ICD-10-CM | POA: Insufficient documentation

## 2018-08-08 DIAGNOSIS — G8929 Other chronic pain: Secondary | ICD-10-CM

## 2018-08-08 DIAGNOSIS — M6281 Muscle weakness (generalized): Secondary | ICD-10-CM

## 2018-08-08 NOTE — Therapy (Signed)
[place], [place], [address] Phone: [phone]   Fax:  [phone]  Physical Therapy Evaluation  Patient Details  Name: Erica Walters MRN: [id number] Date of Birth: 11-02-1965 Referring Provider (PT): Delsa Grana, PA-C   Encounter Date: 08/08/2018  PT End of Session - 08/08/18 1119    Visit Number  1    Number of Visits  12    Authorization Type  UHC    PT Start Time  1055    PT Stop Time  1145    PT Time Calculation (min)  50 min    Activity Tolerance  Patient tolerated treatment well    Behavior During Therapy  [facility] for tasks assessed/performed       Past Medical History:  Diagnosis Date  . Allergy   . Arthritis     Past Surgical History:  Procedure Laterality Date  . TOTAL ABDOMINAL HYSTERECTOMY W/ BILATERAL SALPINGOOPHORECTOMY      There were no vitals filed for this visit.   Subjective Assessment - 08/08/18 1055    Subjective  Pt arriving to therapy for PT evaluation of her low back pain which has been worsening. Pt reporting pain radiating down left leg and hip and also reporting pain in upper thoracic and cervical spine radiating down L shoulder.      Pertinent History  hysterectomy    Limitations  Standing;Walking;House hold activities    How long can you sit comfortably?  30 minutes     How long can you stand comfortably?  1 hour    How long can you walk comfortably?  a couple of hours    Currently in Pain?  Yes    Pain Score  4     Pain Location  Back    Pain Orientation  Lower    Pain Descriptors / Indicators  Aching;Sore;Discomfort    Pain Type  Chronic pain    Pain Radiating Towards  L hip    Pain Onset  More than a month ago    Pain Frequency  Intermittent    Aggravating Factors   standing, working on her feet all day    Pain Relieving Factors  resting, changing positions    Effect of Pain on Daily Activities  difficulty working on her feet all day         [facility] PT  Assessment - 08/08/18 0001      Assessment   Medical Diagnosis  LBP with L sciatica, cevialgia with radiation into L shoulder    Referring Provider (PT)  Delsa Grana, PA-C    Hand Dominance  Right    Prior Therapy  no      Precautions   Precautions  None      Restrictions   Weight Bearing Restrictions  No      Balance Screen   Has the patient fallen in the past 6 months  No    Is the patient reluctant to leave their home because of a fear of falling?   No      Home Film/video editor residence    Home Equipment  None      Prior Function   Level of Independence  Independent    Vocation  Full time employment    Vocation Requirements  Pt works at [employer] and is on her feet for 7-8 hours each day      Cognition   Overall Cognitive Status  Within Functional Limits  for tasks assessed      Observation/Other Assessments   Focus on Therapeutic Outcomes (FOTO)   53% limitation      Posture/Postural Control   Posture/Postural Control  Postural limitations    Postural Limitations  Rounded Shoulders;Forward head;Increased thoracic kyphosis      ROM / Strength   AROM / PROM / Strength  AROM;Strength      AROM   AROM Assessment Site  Lumbar    Lumbar Flexion  50   pain in lumbar paraspinals   Lumbar Extension  10    Lumbar - Right Side Bend  20     Lumbar - Left Side Bend  20    Lumbar - Right Rotation  WFL, but pain noted L lumbar region    Lumbar - Left Rotation  [facility]      Strength   Overall Strength  Deficits    Overall Strength Comments  bilateral hips grossly 4/5, QL: 3/5      Palpation   Palpation comment  tenderness in lumbar paraspinals L > R, tightness noted in L levator and medial scapular border      Special Tests    Special Tests  Lumbar    Lumbar Tests  Straight Leg Raise      Straight Leg Raise   Findings  Negative    Side   Left    Comment  also negative on R      Ambulation/Gait   Gait Comments  WNL                 Objective measurements completed on examination: See above findings.      [facility] PT Treatment/Exercise - 08/08/18 0001      Exercises   Exercises  Lumbar      Lumbar Exercises: Stretches   Active Hamstring Stretch  Right;Left;2 reps;20 seconds    Single Knee to Chest Stretch  Right;Left;2 reps;20 seconds             PT Education - 08/08/18 1118    Education Details  HEP: single knee to chest, Hamstring stretches    Person(s) Educated  Patient    Methods  Explanation;Demonstration;Handout    Comprehension  Verbalized understanding;Returned demonstration       PT Short Term Goals - 08/08/18 1334      PT SHORT TERM GOAL #1   Title  Pt will be independent in her HEP.     Time  3    Period  Weeks    Status  New    Target Date  08/29/18      PT SHORT TERM GOAL #2   Title  Pt will be able to demonstrate correct lifting posture and mechanics for greater >/= 10 pounds.     Time  3    Period  Weeks    Status  New    Target Date  08/29/18        PT Long Term Goals - 08/08/18 1330      PT LONG TERM GOAL #1   Title  Pt will be able to improve her FOTO from 53% limitation to </= 41 % limitiation.     Baseline  53% on 08/08/2018    Time  6    Period  Weeks    Status  New    Target Date  09/19/18      PT LONG TERM GOAL #2   Title  Pt will improve her hamstring flexibility to >/= 90  degrees to improve functional mobility,    Time  6    Period  Weeks    Status  New    Target Date  09/19/18      PT LONG TERM GOAL #3   Title  Pt will able to report pain </= 2/10 following a days work.     Baseline  currently pain can vary from 4-8/10.      PT LONG TERM GOAL #4   Title  Pt will be able to improve lumbar flexion to >/= 70 degrees with no pain reported    Time  6    Period  Weeks    Status  New    Target Date  09/19/18             Plan - 08/08/18 1346    Clinical Impression Statement  Pt arriving to therpay reoprting 4/10 low  back pain and cervical pain which extends into her left shoulder at rest. Pt with limitation in her lumbar AROM and tenderness noted with palpation. Pt was instructed in hamstring stretches, and SLTC exercises to begin her HEP. Postural edu was also performed. Pt would benefit from skilled PT to further assess her cervical spine pain as well as LBP with sciatica.     Clinical Presentation  Evolving    Clinical Presentation due to:  L sciatica reported, L cervical pain extending into L shoulder    Clinical Decision Making  Low    Rehab Potential  Good    PT Frequency  2x / week    PT Duration  6 weeks    PT Treatment/Interventions  ADLs/Self Care Home Management;Moist Heat;Cryotherapy;Traction;Therapeutic activities;Therapeutic exercise;Stair training;Functional mobility training;Neuromuscular re-education;Patient/family education;Manual techniques;Passive range of motion;Dry needling;Taping    PT Next Visit Plan  Lumbar and cervical stretching, core strengthening, HS stretching, further assess cervical spine, Issue pt a printed HEP (I was unable to print out at evaluation)    PT Home Exercise Plan  SKTC, Hamstring stretches    Consulted and Agree with Plan of Care  Patient       Patient will benefit from skilled therapeutic intervention in order to improve the following deficits and impairments:  Pain, Postural dysfunction, Impaired flexibility, Decreased activity tolerance, Decreased strength, Decreased range of motion  Visit Diagnosis: Acute bilateral low back pain with left-sided sciatica  Muscle weakness (generalized)  Cervicalgia  Chronic left shoulder pain     Problem List Patient Active Problem List   Diagnosis Date Noted  . Cervical radiculopathy 07/29/2018  . DDD (degenerative disc disease), cervical 07/29/2018  . Adnexal mass 07/20/2015  . Pelvic pain in female 07/19/2015  . Otitis externa 03/16/2015  . Smoker 03/16/2015    Oretha Caprice, PT 08/08/2018, 1:52  PM  [facility] [address] [place], [place], [address] Phone: [phone]   Fax:  [phone]  Name: Erica Walters MRN: [id number] Date of Birth: 04/11/1966

## 2018-08-15 ENCOUNTER — Ambulatory Visit: Payer: 59 | Admitting: Family Medicine

## 2018-08-19 ENCOUNTER — Ambulatory Visit: Payer: 59 | Admitting: Physical Therapy

## 2018-08-19 ENCOUNTER — Ambulatory Visit: Payer: 59 | Admitting: Family Medicine

## 2018-08-19 ENCOUNTER — Encounter: Payer: Self-pay | Admitting: Physical Therapy

## 2018-08-19 ENCOUNTER — Ambulatory Visit
Admission: RE | Admit: 2018-08-19 | Discharge: 2018-08-19 | Disposition: A | Discharge status: Home / Self Care | Payer: 59 | Source: Ambulatory Visit | Attending: Family Medicine | Admitting: Family Medicine

## 2018-08-19 ENCOUNTER — Encounter: Payer: Self-pay | Admitting: Family Medicine

## 2018-08-19 VITALS — BP 122/84 | HR 96 | Temp 97.8°F | Resp 16 | Ht 66.5 in | Wt 190.5 lb

## 2018-08-19 DIAGNOSIS — M722 Plantar fascial fibromatosis: Secondary | ICD-10-CM | POA: Diagnosis not present

## 2018-08-19 DIAGNOSIS — G8929 Other chronic pain: Secondary | ICD-10-CM | POA: Diagnosis not present

## 2018-08-19 DIAGNOSIS — M6281 Muscle weakness (generalized): Secondary | ICD-10-CM

## 2018-08-19 DIAGNOSIS — M19011 Primary osteoarthritis, right shoulder: Secondary | ICD-10-CM | POA: Diagnosis not present

## 2018-08-19 DIAGNOSIS — M5442 Lumbago with sciatica, left side: Secondary | ICD-10-CM

## 2018-08-19 DIAGNOSIS — M25511 Pain in right shoulder: Secondary | ICD-10-CM | POA: Diagnosis not present

## 2018-08-19 NOTE — Therapy (Signed)
[facility] [place], [place], [address] Phone: [phone]   Fax:  [phone]  Physical Therapy Treatment  Patient Details  Name: Erica Walters MRN: [id number] Date of Birth: July 18, 1965 Referring Provider (PT): Delsa Grana, PA-C   Encounter Date: 08/19/2018  PT End of Session - 08/19/18 1511    Visit Number  2    Number of Visits  12    Authorization Type  UHC    PT Start Time  0300    PT Stop Time  0355    PT Time Calculation (min)  55 min       Past Medical History:  Diagnosis Date  . Allergy   . Arthritis     Past Surgical History:  Procedure Laterality Date  . TOTAL ABDOMINAL HYSTERECTOMY W/ BILATERAL SALPINGOOPHORECTOMY      There were no vitals filed for this visit.  Subjective Assessment - 08/19/18 1506    Subjective  Pt reports feet are more painful 8/10 than low back depsite being off feet more this week. Her husband just had back surgery so she has not had as much time to do her HEP.     Currently in Pain?  Yes    Pain Score  3     Pain Location  Back    Pain Orientation  Lower;Mid    Pain Descriptors / Indicators  Sharp   pinch   Pain Radiating Towards  not radiating     Aggravating Factors   standing, working     Pain Relieving Factors  not working                       [employer] [facility] Adult PT Treatment/Exercise - 08/19/18 0001      Lumbar Exercises: Stretches   Active Hamstring Stretch  Right;Left;2 reps;20 seconds    Single Knee to Chest Stretch  Right;Left;2 reps;20 seconds    Piriformis Stretch  2 reps;30 seconds    Figure 4 Stretch  2 reps;30 seconds      Lumbar Exercises: Supine   Pelvic Tilt  15 reps    Clam  20 reps    Clam Limitations  green band     Bridge  10 reps    Bridge with clamshell  10 reps   green     Modalities   Modalities  Moist Heat      Moist Heat Therapy   Number Minutes Moist Heat  15 Minutes    Moist Heat Location  Lumbar Spine                PT Short Term Goals - 08/08/18 1334      PT SHORT TERM GOAL #1   Title  Pt will be independent in her HEP.     Time  3    Period  Weeks    Status  New    Target Date  08/29/18      PT SHORT TERM GOAL #2   Title  Pt will be able to demonstrate correct lifting posture and mechanics for greater >/= 10 pounds.     Time  3    Period  Weeks    Status  New    Target Date  08/29/18        PT Long Term Goals - 08/08/18 1330      PT LONG TERM GOAL #1   Title  Pt will be able to improve her FOTO from 53% limitation  to </= 41 % limitiation.     Baseline  53% on 08/08/2018    Time  6    Period  Weeks    Status  New    Target Date  09/19/18      PT LONG TERM GOAL #2   Title  Pt will improve her hamstring flexibility to >/= 90 degrees to improve functional mobility,    Time  6    Period  Weeks    Status  New    Target Date  09/19/18      PT LONG TERM GOAL #3   Title  Pt will able to report pain </= 2/10 following a days work.     Baseline  currently pain can vary from 4-8/10.      PT LONG TERM GOAL #4   Title  Pt will be able to improve lumbar flexion to >/= 70 degrees with no pain reported    Time  6    Period  Weeks    Status  New    Target Date  09/19/18            Plan - 08/19/18 1552    Clinical Impression Statement  Pt reports she was able to do stretches a couple of times however her husband recently had lumbar surgery and requires increased time to care for. We progressed with Hip stretches and stabilization and issued paper HEP today. Will plan to add in right shoulder and cervical spine whe she sees PT next week.     PT Next Visit Plan  Lumbar stretching, core strengthening, HS stretching, add neck and right shoulder at [facility] next week     PT Home Exercise Plan  SKTC, Hamstring stretches, piriformis, figure 4, bridge with clam (green)     Consulted and Agree with Plan of Care  Patient       Patient will benefit from skilled therapeutic  intervention in order to improve the following deficits and impairments:  Pain, Postural dysfunction, Impaired flexibility, Decreased activity tolerance, Decreased strength, Decreased range of motion  Visit Diagnosis: Acute bilateral low back pain with left-sided sciatica  Muscle weakness (generalized)     Problem List Patient Active Problem List   Diagnosis Date Noted  . Cervical radiculopathy 07/29/2018  . DDD (degenerative disc disease), cervical 07/29/2018  . Adnexal mass 07/20/2015  . Pelvic pain in female 07/19/2015  . Otitis externa 03/16/2015  . Smoker 03/16/2015    Dorene Ar, PTA 08/19/2018, 3:56 PM  [facility] [address] [place], [place], [address] Phone: [phone]   Fax:  [phone]  Name: Erica Walters MRN: [id number] Date of Birth: [birth year]

## 2018-08-19 NOTE — Progress Notes (Signed)
Patient ID: Erica Walters, female    DOB: 10/22/1965, 53 y.o.   MRN: [id number]  PCP: Delsa Grana, PA-C  Chief Complaint  Patient presents with  . Back Pain    Patient in today for a 3 week follow up on back pain. Spine specialist removed her off of Lyrica. Has placed her on muscle relaxer, and ibuprofen.   . Foot Pain    Patient in with c/o bilateral foot pain.     Subjective:   Erica Walters is a 53 y.o. female, presents to clinic with CC of feet hurting more, she is seeing the specialist for her neck and back.  She stopped lyrica, prednisone didn't help On ibuprpfen high dose Right foot very painful, worse in the AM and when on her feet for long periods of time, pain for 3 months, rated 7/10 Some limping  Right shoulder hurting for 15 years - no recent imaging, no recent injury, hurts mostly when she uses to do strenuous work over head.    Patient Active Problem List   Diagnosis Date Noted  . Cervical radiculopathy 07/29/2018  . DDD (degenerative disc disease), cervical 07/29/2018  . Adnexal mass 07/20/2015  . Pelvic pain in female 07/19/2015  . Otitis externa 03/16/2015  . Smoker 03/16/2015     Prior to Admission medications   Medication Sig Start Date End Date Taking? Authorizing Provider  cyclobenzaprine (FLEXERIL) 5 MG tablet TK 1 TO 2 TS PO Q 8 TO 12 H PRN 08/07/18  Yes [provider]  naproxen sodium (ALEVE) 220 MG tablet Take 220 mg by mouth.   Yes [provider]  OVER THE COUNTER MEDICATION [place]. Two capsules daily.   Yes [provider]  atorvastatin (LIPITOR) 20 MG tablet Take 1 tablet (20 mg total) by mouth daily. Patient not taking: Reported on 07/25/2018 04/17/18   Susy Frizzle, MD  pregabalin (LYRICA) 75 MG capsule Take 1 capsule (75 mg total) by mouth 2 (two) times daily. Patient not taking: Reported on 08/19/2018 07/25/18   Delsa Grana, PA-C     No Known Allergies   Family History  Problem Relation Age  of Onset  . Colon cancer Paternal Grandfather   . Esophageal cancer Neg Hx   . Liver cancer Neg Hx   . Pancreatic cancer Neg Hx   . Rectal cancer Neg Hx   . Stomach cancer Neg Hx      Social History   Socioeconomic History  . Marital status: Married    Spouse name: Not on file  . Number of children: Not on file  . Years of education: Not on file  . Highest education level: Not on file  Occupational History  . Not on file  Social Needs  . Financial resource strain: Not on file  . Food insecurity:    Worry: Not on file    Inability: Not on file  . Transportation needs:    Medical: Not on file    Non-medical: Not on file  Tobacco Use  . Smoking status: Current Every Day Smoker  . Smokeless tobacco: Never Used  Substance and Sexual Activity  . Alcohol use: Yes    Alcohol/week: 0.0 standard drinks    Comment: daily wine   . Drug use: Yes  . Sexual activity: Not Currently  Lifestyle  . Physical activity:    Days per week: Not on file    Minutes per session: Not on file  . Stress: Not on file  Relationships  .  Social connections:    Talks on phone: Not on file    Gets together: Not on file    Attends religious service: Not on file    Active member of club or organization: Not on file    Attends meetings of clubs or organizations: Not on file    Relationship status: Not on file  . Intimate partner violence:    Fear of current or ex partner: Not on file    Emotionally abused: Not on file    Physically abused: Not on file    Forced sexual activity: Not on file  Other Topics Concern  . Not on file  Social History Narrative  . Not on file     Review of Systems  Constitutional: Negative.   HENT: Negative.   Eyes: Negative.   Respiratory: Negative.   Cardiovascular: Negative.   Gastrointestinal: Negative.   Endocrine: Negative.   Genitourinary: Negative.   Musculoskeletal: Negative.   Skin: Negative.   Allergic/Immunologic: Negative.   Neurological:  Negative.   Hematological: Negative.   Psychiatric/Behavioral: Negative.   All other systems reviewed and are negative.      Objective:    Vitals:   08/19/18 0857  BP: 122/84  Pulse: 96  Resp: 16  Temp: 97.8 F (36.6 C)  TempSrc: Oral  SpO2: 98%  Weight: 190 lb 8 oz (86.4 kg)  Height: 5' 6.5" (1.689 m)      Physical Exam Vitals signs and nursing note reviewed.  Constitutional:      Appearance: She is well-developed.  HENT:     Head: Normocephalic and atraumatic.     Nose: Nose normal.  Eyes:     General:        Right eye: No discharge.        Left eye: No discharge.     Conjunctiva/sclera: Conjunctivae normal.  Neck:     Trachea: No tracheal deviation.  Cardiovascular:     Rate and Rhythm: Normal rate and regular rhythm.     Pulses:          Dorsalis pedis pulses are 2+ on the right side.       Posterior tibial pulses are 2+ on the right side.  Pulmonary:     Effort: Pulmonary effort is normal. No respiratory distress.     Breath sounds: No stridor.  Musculoskeletal: Normal range of motion.     Right shoulder: She exhibits normal range of motion, no tenderness, no bony tenderness, no swelling, no effusion, normal pulse and normal strength.     Right foot: Normal range of motion. No deformity, bunion or foot drop.  Feet:     Right foot:     Skin integrity: Skin integrity normal. No ulcer, blister, skin breakdown, erythema, warmth, callus, dry skin or fissure.     Comments: Right foot ttp to proximal plantar arch, no ttp to heel or pad of foot, no deformity, nodules, edema, erythema, warmth Skin:    General: Skin is warm and dry.     Findings: No rash.  Neurological:     Mental Status: She is alert.     Motor: No abnormal muscle tone.     Coordination: Coordination normal.  Psychiatric:        Behavior: Behavior normal.           Assessment & Plan:      ICD-10-CM   1. Chronic right shoulder pain M25.511 DG Shoulder Right   G89.29 Ambulatory  referral to Physical Therapy  pain with flexion > 90 degrees and pain with internal rotation, no ttp, no deformity, start PT, f/up 1 month (nsaids as per spine specialist)  2. Plantar fasciitis, right M72.2 Ambulatory referral to Podiatry   referral to podiatry, weight loss can help, specialist can determine need for orthotics/boot at night, etc.    Right shoulder pain, chronic, some may be related to neck stiffness, pt unsure about imaging, ordered plain films, PT will likely improve ROM, decrease pain and help with both the neck and shoulder, f/up in 4-6 weeks, if not improving can send to ortho.  Right foot pain likely unrelated to low back pain, suspect plantar fasciitis, referral to podiatry for eval.   Delsa Grana, PA-C 08/19/18 9:11 AM

## 2018-08-19 NOTE — Patient Instructions (Signed)
Let me know about specialist for your shoulder, if its better or you would like a specialist to evaluate  I would suggest going to podiatry for your right foot pain.

## 2018-08-20 ENCOUNTER — Encounter: Payer: Self-pay | Admitting: Physical Therapy

## 2018-08-20 ENCOUNTER — Ambulatory Visit: Payer: 59 | Admitting: Physical Therapy

## 2018-08-20 DIAGNOSIS — M6281 Muscle weakness (generalized): Secondary | ICD-10-CM

## 2018-08-20 DIAGNOSIS — M5442 Lumbago with sciatica, left side: Secondary | ICD-10-CM | POA: Diagnosis not present

## 2018-08-20 NOTE — Therapy (Signed)
[place], [place], [address] Phone: [phone]   Fax:  [phone]  Physical Therapy Treatment  Patient Details  Name: Erica Walters MRN: [id number] Date of Birth: [birth year] Referring Provider (PT): Delsa Grana, PA-C   Encounter Date: 08/20/2018  PT End of Session - 08/20/18 1114    Visit Number  3    Number of Visits  12    Authorization Type  [employer]    PT Start Time  1100    PT Stop Time  1200    PT Time Calculation (min)  60 min       Past Medical History:  Diagnosis Date  . Allergy   . Arthritis     Past Surgical History:  Procedure Laterality Date  . TOTAL ABDOMINAL HYSTERECTOMY W/ BILATERAL SALPINGOOPHORECTOMY      There were no vitals filed for this visit.  Subjective Assessment - 08/20/18 1039    Subjective  Pt reports stretching at home after yesterdays treatment. She felt the muscles were tightening up rather than stretching.                        [facility] Adult PT Treatment/Exercise - 08/20/18 0001      Self-Care   Self-Care  Other Self-Care Comments    Other Self-Care Comments   use of tennis ball for self TPR       Lumbar Exercises: Stretches   Passive Hamstring Stretch  1 rep;30 seconds    Quadruped Mid Back Stretch Limitations  childs pose with laterals       Lumbar Exercises: Machines for Strengthening   Leg Press  2 plates bilateral       Lumbar Exercises: Supine   Bridge  10 reps      Lumbar Exercises: Prone   Straight Leg Raise  10 reps    Straight Leg Raises Limitations  R/L x 10 then with bent knee x 10 R/L all with cues for abdominal draw in      Lumbar Exercises: Quadruped   Madcat/Old Horse  10 reps      Moist Heat Therapy   Number Minutes Moist Heat  15 Minutes    Moist Heat Location  Lumbar Spine   prone     Manual Therapy   Manual therapy comments  trigger pooint release with tennis ball left glute medius x 3               PT Education - 08/20/18 1056    Education Details  HEP    Person(s) Educated  Patient    Methods  Explanation;Handout    Comprehension  Verbalized understanding       PT Short Term Goals - 08/08/18 1334      PT SHORT TERM GOAL #1   Title  Pt will be independent in her HEP.     Time  3    Period  Weeks    Status  New    Target Date  08/29/18      PT SHORT TERM GOAL #2   Title  Pt will be able to demonstrate correct lifting posture and mechanics for greater >/= 10 pounds.     Time  3    Period  Weeks    Status  New    Target Date  08/29/18        PT Long Term Goals - 08/08/18 1330      PT LONG TERM GOAL #  1   Title  Pt will be able to improve her FOTO from 53% limitation to </= 41 % limitiation.     Baseline  53% on 08/08/2018    Time  6    Period  Weeks    Status  New    Target Date  09/19/18      PT LONG TERM GOAL #2   Title  Pt will improve her hamstring flexibility to >/= 90 degrees to improve functional mobility,    Time  6    Period  Weeks    Status  New    Target Date  09/19/18      PT LONG TERM GOAL #3   Title  Pt will able to report pain </= 2/10 following a days work.     Baseline  currently pain can vary from 4-8/10.      PT LONG TERM GOAL #4   Title  Pt will be able to improve lumbar flexion to >/= 70 degrees with no pain reported    Time  6    Period  Weeks    Status  New    Target Date  09/19/18            Plan - 08/20/18 1057    Clinical Impression Statement  Pt arrives reporting no pain. Her right shoulder is more painful after having an xray yesterday. She reports results of xray showed shoulder arthritis. Pt demonstrates WNL left hip ER/IR, flexion however has spasm let glut medius. Used tennis ball  for trigger point release and instructed pt for home. Less tenderness after TPR. Progressed with prone HEP for hip extension strength/core strength. Pt given handout for HEP. Ended with HMP.     PT Next Visit Plan  add neck  and right shoulder at ERO next week; needs modaliites added to POC;  Lumbar stretching, core strengthening, HS stretching    PT Home Exercise Plan  SKTC, Hamstring stretches, piriformis, figure 4, bridge with clam (green) , prone hip extension with knee straight, knee bent, cat/camel     Consulted and Agree with Plan of Care  Patient       Patient will benefit from skilled therapeutic intervention in order to improve the following deficits and impairments:  Pain, Postural dysfunction, Impaired flexibility, Decreased activity tolerance, Decreased strength, Decreased range of motion  Visit Diagnosis: Acute bilateral low back pain with left-sided sciatica  Muscle weakness (generalized)     Problem List Patient Active Problem List   Diagnosis Date Noted  . Cervical radiculopathy 07/29/2018  . DDD (degenerative disc disease), cervical 07/29/2018  . Adnexal mass 07/20/2015  . Pelvic pain in female 07/19/2015  . Otitis externa 03/16/2015  . Smoker 03/16/2015    Erica Walters, PTA 08/20/2018, 11:35 AM  [facility] [address] [place], [place], [address] Phone: [phone]   Fax:  [phone]  Name: Erica Walters MRN: [id number] Date of Birth: Dec 05, 1965

## 2018-08-22 ENCOUNTER — Ambulatory Visit (INDEPENDENT_AMBULATORY_CARE_PROVIDER_SITE_OTHER): Payer: 59

## 2018-08-22 ENCOUNTER — Encounter: Payer: Self-pay | Admitting: Podiatry

## 2018-08-22 ENCOUNTER — Ambulatory Visit: Payer: 59

## 2018-08-22 ENCOUNTER — Ambulatory Visit (INDEPENDENT_AMBULATORY_CARE_PROVIDER_SITE_OTHER): Payer: 59 | Admitting: Podiatry

## 2018-08-22 VITALS — BP 118/85 | HR 86 | Resp 16

## 2018-08-22 DIAGNOSIS — M722 Plantar fascial fibromatosis: Secondary | ICD-10-CM

## 2018-08-22 DIAGNOSIS — M79671 Pain in right foot: Secondary | ICD-10-CM

## 2018-08-22 MED ORDER — DICLOFENAC SODIUM 75 MG PO TBEC: 75.0000 mg | DELAYED_RELEASE_TABLET | Freq: Two times a day (BID) | ORAL | 2 refills | Status: DC

## 2018-08-22 MED ORDER — TRIAMCINOLONE ACETONIDE 10 MG/ML IJ SUSP
10.0000 mg | Freq: Once | INTRAMUSCULAR | Status: AC
  Administered 2018-08-22: 10 mg

## 2018-08-22 NOTE — Patient Instructions (Signed)

## 2018-08-22 NOTE — Progress Notes (Signed)
   Subjective:    Patient ID: Erica Walters, female    DOB: 08-04-1965, 53 y.o.   MRN: [id number]  HPI    Review of Systems  All other systems reviewed and are negative.      Objective:   Physical Exam        Assessment & Plan:

## 2018-08-23 NOTE — Progress Notes (Signed)
Subjective:   Patient ID: Erica Walters, female   DOB: 53 y.o.   MRN: [id number]   HPI Patient presents with significant pain in the heel region right over left stating its been present for around 3 months and that she works on hard floors in retail environment.  States is worse when getting up in the morning after sitting and she does not smoke and likes to be active   Review of Systems  All other systems reviewed and are negative.       Objective:  Physical Exam Vitals signs and nursing note reviewed.  Constitutional:      Appearance: She is well-developed.  Pulmonary:     Effort: Pulmonary effort is normal.  Musculoskeletal: Normal range of motion.  Skin:    General: Skin is warm.  Neurological:     Mental Status: She is alert.     Neurovascular status intact muscle strength was noted to be adequate range of motion within normal limits with exquisite discomfort of the plantar heel right over left at the insertional point of the tendon into the calcaneus.  Patient is found to have good digital perfusion and is well oriented x3     Assessment:  Acute plantar fasciitis right over left with inflammation fluid in the medial band     Plan:  H&P both conditions reviewed sterile preps applied and injected each fascia at the insertion 3 mg Kenalog 5 mg Xylocaine and applied fascial brace right gave instructions for stretching exercises physical therapy supportive shoes and placed on diclofenac 75 mg twice daily and reappoint to recheck  X-rays indicate there is spur formation with no indications of stress fracture arthritis

## 2018-08-25 ENCOUNTER — Encounter: Payer: Self-pay | Admitting: Family Medicine

## 2018-08-26 ENCOUNTER — Encounter: Payer: Self-pay | Admitting: Physical Therapy

## 2018-08-26 ENCOUNTER — Ambulatory Visit: Payer: 59 | Attending: Family Medicine | Admitting: Physical Therapy

## 2018-08-26 DIAGNOSIS — M25512 Pain in left shoulder: Secondary | ICD-10-CM | POA: Insufficient documentation

## 2018-08-26 DIAGNOSIS — M25511 Pain in right shoulder: Secondary | ICD-10-CM | POA: Insufficient documentation

## 2018-08-26 DIAGNOSIS — M542 Cervicalgia: Secondary | ICD-10-CM | POA: Insufficient documentation

## 2018-08-26 DIAGNOSIS — M5442 Lumbago with sciatica, left side: Secondary | ICD-10-CM | POA: Diagnosis not present

## 2018-08-26 DIAGNOSIS — G8929 Other chronic pain: Secondary | ICD-10-CM | POA: Insufficient documentation

## 2018-08-26 DIAGNOSIS — M6281 Muscle weakness (generalized): Secondary | ICD-10-CM | POA: Diagnosis not present

## 2018-08-26 NOTE — Therapy (Signed)
[place], [place], [address] Phone: [phone]   Fax:  [phone]  Physical Therapy Treatment  Patient Details  Name: Erica Walters MRN: [id number] Date of Birth: [birth year] Referring Provider (PT): Delsa Grana, PA-C   Encounter Date: 08/26/2018  PT End of Session - 08/26/18 1558    Visit Number  4    Number of Visits  12    Authorization Type  [employer]    PT Start Time  0348    PT Stop Time  0433    PT Time Calculation (min)  45 min       Past Medical History:  Diagnosis Date  . Allergy   . Arthritis     Past Surgical History:  Procedure Laterality Date  . TOTAL ABDOMINAL HYSTERECTOMY W/ BILATERAL SALPINGOOPHORECTOMY      There were no vitals filed for this visit.  Subjective Assessment - 08/26/18 1550    Subjective  I'm fine right now. Having a good day today.     Currently in Pain?  No/denies         [facility] PT Assessment - 08/26/18 0001      Strength   Overall Strength Comments  Left Hip IR 4-/5                   [facility] Adult PT Treatment/Exercise - 08/26/18 0001      Lumbar Exercises: Stretches   Passive Hamstring Stretch  1 rep;30 seconds    Piriformis Stretch  2 reps;30 seconds    Figure 4 Stretch  2 reps;30 seconds      Lumbar Exercises: Supine   Pelvic Tilt  --    Clam  20 reps    Clam Limitations  green band     Bridge  20 reps    Bridge with clamshell  10 reps   green   Single Leg Bridge  10 reps      Lumbar Exercises: Sidelying   Clam  20 reps    Clam Limitations  and reverse clam       Lumbar Exercises: Prone   Straight Leg Raise  10 reps   2 sets each   Straight Leg Raises Limitations  R/L x 10 then with bent knee x 10 R/L all with cues for abdominal draw in      Lumbar Exercises: Quadruped   Madcat/Old Horse  10 reps      Manual Therapy   Manual therapy comments  tennis ball for STW to left upper glut lower lumbar              PT  Education - 08/26/18 1637    Education Details  HEP    Person(s) Educated  Patient    Methods  Explanation;Handout    Comprehension  Verbalized understanding       PT Short Term Goals - 08/08/18 1334      PT SHORT TERM GOAL #1   Title  Pt will be independent in her HEP.     Time  3    Period  Weeks    Status  New    Target Date  08/29/18      PT SHORT TERM GOAL #2   Title  Pt will be able to demonstrate correct lifting posture and mechanics for greater >/= 10 pounds.     Time  3    Period  Weeks    Status  New    Target Date  08/29/18        PT Long Term Goals - 08/08/18 1330      PT LONG TERM GOAL #1   Title  Pt will be able to improve her FOTO from 53% limitation to </= 41 % limitiation.     Baseline  53% on 08/08/2018    Time  6    Period  Weeks    Status  New    Target Date  09/19/18      PT LONG TERM GOAL #2   Title  Pt will improve her hamstring flexibility to >/= 90 degrees to improve functional mobility,    Time  6    Period  Weeks    Status  New    Target Date  09/19/18      PT LONG TERM GOAL #3   Title  Pt will able to report pain </= 2/10 following a days work.     Baseline  currently pain can vary from 4-8/10.      PT LONG TERM GOAL #4   Title  Pt will be able to improve lumbar flexion to >/= 70 degrees with no pain reported    Time  6    Period  Weeks    Status  New    Target Date  09/19/18            Plan - 08/26/18 1611    Clinical Impression Statement  Pt arrives reporting some pain after Prone HEP. Added pillow under hips and reinforced technique, not to over extend spine. She reports exercise better with pillow under hips. Left SI area tender to palpation as well as lower lumbar. Tennis ball used to soften tissue. Updated HEP with clam and reverse clam to target hip rotator strength. Will have ERO next visit to add neck and shoulder.     PT Next Visit Plan  add neck and right shoulder at ERO next week; needs modaliites added to POC;   Lumbar stretching, core strengthening, HS stretching    PT Home Exercise Plan  SKTC, Hamstring stretches, piriformis, figure 4, bridge with clam (green) , prone hip extension with knee straight, knee bent, cat/camel , clam , reverse clam    Consulted and Agree with Plan of Care  Patient       Patient will benefit from skilled therapeutic intervention in order to improve the following deficits and impairments:  Pain, Postural dysfunction, Impaired flexibility, Decreased activity tolerance, Decreased strength, Decreased range of motion  Visit Diagnosis: Acute bilateral low back pain with left-sided sciatica  Muscle weakness (generalized)     Problem List Patient Active Problem List   Diagnosis Date Noted  . Cervical radiculopathy 07/29/2018  . DDD (degenerative disc disease), cervical 07/29/2018  . Adnexal mass 07/20/2015  . Pelvic pain in female 07/19/2015  . Otitis externa 03/16/2015  . Smoker 03/16/2015    Dorene Ar , PTA 08/26/2018, 4:45 PM  [facility] [address] [place], [place], [address] Phone: [phone]   Fax:  [phone]  Name: Erica Walters MRN: [id number] Date of Birth: 1966-06-19

## 2018-08-28 ENCOUNTER — Ambulatory Visit: Payer: Self-pay | Admitting: Physical Therapy

## 2018-09-02 ENCOUNTER — Ambulatory Visit: Payer: 59 | Admitting: Physical Therapy

## 2018-09-02 ENCOUNTER — Encounter: Payer: Self-pay | Admitting: Physical Therapy

## 2018-09-02 DIAGNOSIS — M5442 Lumbago with sciatica, left side: Secondary | ICD-10-CM

## 2018-09-02 DIAGNOSIS — G8929 Other chronic pain: Secondary | ICD-10-CM

## 2018-09-02 DIAGNOSIS — M542 Cervicalgia: Secondary | ICD-10-CM

## 2018-09-02 DIAGNOSIS — M25511 Pain in right shoulder: Secondary | ICD-10-CM

## 2018-09-02 DIAGNOSIS — M6281 Muscle weakness (generalized): Secondary | ICD-10-CM

## 2018-09-03 ENCOUNTER — Encounter: Payer: Self-pay | Admitting: Physical Therapy

## 2018-09-03 ENCOUNTER — Ambulatory Visit: Payer: 59 | Admitting: Physical Therapy

## 2018-09-03 ENCOUNTER — Other Ambulatory Visit: Payer: Self-pay

## 2018-09-03 DIAGNOSIS — M5442 Lumbago with sciatica, left side: Secondary | ICD-10-CM | POA: Diagnosis not present

## 2018-09-03 DIAGNOSIS — M6281 Muscle weakness (generalized): Secondary | ICD-10-CM

## 2018-09-03 DIAGNOSIS — M542 Cervicalgia: Secondary | ICD-10-CM

## 2018-09-03 DIAGNOSIS — M25511 Pain in right shoulder: Secondary | ICD-10-CM

## 2018-09-03 DIAGNOSIS — M25512 Pain in left shoulder: Secondary | ICD-10-CM

## 2018-09-03 DIAGNOSIS — G8929 Other chronic pain: Secondary | ICD-10-CM

## 2018-09-03 NOTE — Therapy (Signed)
[place], [place], [address] Phone: [phone]   Fax:  [phone]  Physical Therapy Treatment/Re-eval  Patient Details  Name: Erica Walters MRN: [id number] Date of Birth: 1965-08-30 Referring Provider (PT): Delsa Grana, PA-C   Encounter Date: 09/02/2018  PT End of Session - 09/02/18 9470    Visit Number  5    Date for PT Re-Evaluation  09/30/18    Authorization Type  [employer]     PT Start Time  9628    PT Stop Time  1629    PT Time Calculation (min)  44 min    Activity Tolerance  Patient tolerated treatment well       Past Medical History:  Diagnosis Date  . Allergy   . Arthritis     Past Surgical History:  Procedure Laterality Date  . TOTAL ABDOMINAL HYSTERECTOMY W/ BILATERAL SALPINGOOPHORECTOMY      There were no vitals filed for this visit.  Subjective Assessment - 09/02/18 1553    Subjective  Patient has been having neck and shoulder pain for years. She went to a spine doctor who reported arthritis in her neck. Since she has been on voltaren for her foot she has had much less neck and shoulder pain. She has shoulder pain with certain positions. Her neck and shoulder hurt as she does more work throughout the day.     Pertinent History  hysterectomy    Limitations  Standing;Walking;House hold activities    How long can you sit comfortably?  30 minutes     How long can you stand comfortably?  1 hour    How long can you walk comfortably?  a couple of hours    Currently in Pain?  Yes    Pain Score  3     Pain Location  Shoulder    Pain Orientation  Lower    Pain Descriptors / Indicators  Sharp    Pain Type  Chronic pain    Pain Onset  More than a month ago    Pain Frequency  Intermittent    Aggravating Factors   reaching up     Pain Relieving Factors  not working     Effect of Pain on Daily Activities  difficulty          [facility] PT Assessment - 09/03/18 0001      Sensation   Light  Touch  Appears Intact    Additional Comments  No shooting pain down the arm       Coordination   Gross Motor Movements are Fluid and Coordinated  Yes    Fine Motor Movements are Fluid and Coordinated  Yes      AROM   AROM Assessment Site  Cervical    Right Shoulder Flexion  150 Degrees   anterior pain    Right Shoulder External Rotation  --   behind the head with anterior pain    Cervical Flexion  40    Cervical Extension  20    Cervical - Right Rotation  70    Cervical - Left Rotation  70      PROM   Overall PROM Comments  pain with end range right shoulder ER and flexion       Strength   Strength Assessment Site  Shoulder    Right/Left Shoulder  Right    Right Shoulder Flexion  4/5    Right Shoulder Internal Rotation  4+/5    Right Shoulder External  Rotation  4/5      Palpation   Palpation comment  spasming of bilatteral upper traps and into cervical spine; tendenress to palpation of the bicpes groove      Special Tests   Other special tests  Hawkins (+); Speeds (+); empty can (+)                    [facility] Adult PT Treatment/Exercise - 09/03/18 0001      Shoulder Exercises: Standing   External Rotation Limitations  yellow x10 with cuing     Internal Rotation Limitations  x10 yellow with cuing     Extension Limitations  x10 yellow     Row Limitations  x10 yellow              PT Education - 09/02/18 1559    Education Details  updated HEP for neck and back     Person(s) Educated  Patient    Methods  Explanation;Tactile cues;Verbal cues;Demonstration    Comprehension  Verbalized understanding;Returned demonstration;Verbal cues required;Tactile cues required       PT Short Term Goals - 08/08/18 1334      PT SHORT TERM GOAL #1   Title  Pt will be independent in her HEP.     Time  3    Period  Weeks    Status  New    Target Date  08/29/18      PT SHORT TERM GOAL #2   Title  Pt will be able to demonstrate correct lifting posture and mechanics  for greater >/= 10 pounds.     Time  3    Period  Weeks    Status  New    Target Date  08/29/18        PT Long Term Goals - 08/08/18 1330      PT LONG TERM GOAL #1   Title  Pt will be able to improve her FOTO from 53% limitation to </= 41 % limitiation.     Baseline  53% on 08/08/2018    Time  6    Period  Weeks    Status  New    Target Date  09/19/18      PT LONG TERM GOAL #2   Title  Pt will improve her hamstring flexibility to >/= 90 degrees to improve functional mobility,    Time  6    Period  Weeks    Status  New    Target Date  09/19/18      PT LONG TERM GOAL #3   Title  Pt will able to report pain </= 2/10 following a days work.     Baseline  currently pain can vary from 4-8/10.      PT LONG TERM GOAL #4   Title  Pt will be able to improve lumbar flexion to >/= 70 degrees with no pain reported    Time  6    Period  Weeks    Status  New    Target Date  09/19/18            Plan - 09/02/18 1642    Clinical Impression Statement  Patient presents with right shoulder pain with active movement and cervical pain. Her pain has improved with voltaren pills given for her foot. She is having very little cervical symptoms today. She is having foot pain. Her shoulder range is limited at end range and painfu;l. Sings and sympotoms are consitent with shoulder biceps and  RTC tedndinitis. She was given light scpaular strengthening and RTC strengthening     Stability/Clinical Decision Making  Evolving/Moderate complexity    Clinical Decision Making  Low    Rehab Potential  Good    PT Frequency  2x / week    PT Duration  4 weeks    PT Treatment/Interventions  ADLs/Self Care Home Management;Moist Heat;Cryotherapy;Traction;Therapeutic activities;Therapeutic exercise;Stair training;Functional mobility training;Neuromuscular re-education;Patient/family education;Manual techniques;Passive range of motion;Dry needling;Taping    PT Next Visit Plan  add neck and right shoulder at [facility]  next week; needs modaliites added to POC;  Lumbar stretching, core strengthening, HS stretching    PT Home Exercise Plan  SKTC, Hamstring stretches, piriformis, figure 4, bridge with clam (green) , prone hip extension with knee straight, knee bent, cat/camel , clam , reverse clam; scap retraction; shoulder extension shoulder IR and ER    Consulted and Agree with Plan of Care  Patient       Patient will benefit from skilled therapeutic intervention in order to improve the following deficits and impairments:  Pain, Postural dysfunction, Impaired flexibility, Decreased activity tolerance, Decreased strength, Decreased range of motion  Visit Diagnosis: Acute bilateral low back pain with left-sided sciatica  Muscle weakness (generalized)  Cervicalgia  Chronic right shoulder pain     Problem List Patient Active Problem List   Diagnosis Date Noted  . Cervical radiculopathy 07/29/2018  . DDD (degenerative disc disease), cervical 07/29/2018  . Adnexal mass 07/20/2015  . Pelvic pain in female 07/19/2015  . Otitis externa 03/16/2015  . Smoker 03/16/2015    Carney Living 09/03/2018, 8:23 AM  [facility] [address] [place], [place], [address] Phone: [phone]   Fax:  [phone]  Name: Erica Walters MRN: [id number] Date of Birth: Jan 07, 1966

## 2018-09-04 ENCOUNTER — Ambulatory Visit (INDEPENDENT_AMBULATORY_CARE_PROVIDER_SITE_OTHER): Payer: 59 | Admitting: Podiatry

## 2018-09-04 ENCOUNTER — Encounter: Payer: Self-pay | Admitting: Podiatry

## 2018-09-04 ENCOUNTER — Encounter: Payer: Self-pay | Admitting: Physical Therapy

## 2018-09-04 DIAGNOSIS — M722 Plantar fascial fibromatosis: Secondary | ICD-10-CM | POA: Diagnosis not present

## 2018-09-04 MED ORDER — TRIAMCINOLONE ACETONIDE 10 MG/ML IJ SUSP
10.0000 mg | Freq: Once | INTRAMUSCULAR | Status: AC
  Administered 2018-09-04: 10 mg

## 2018-09-04 NOTE — Therapy (Signed)
[place], [place], [address] Phone: [phone]   Fax:  [phone]  Physical Therapy Treatment  Patient Details  Name: Erica Walters MRN: [id number] Date of Birth: 1965-11-01 Referring Provider (PT): Delsa Grana, PA-C   Encounter Date: 09/03/2018  PT End of Session - 09/04/18 1200    Visit Number  6    Number of Visits  12    Date for PT Re-Evaluation  09/30/18    Authorization Type  UHC     PT Start Time  3154    PT Stop Time  1626    PT Time Calculation (min)  41 min    Activity Tolerance  Patient tolerated treatment well    Behavior During Therapy  [facility] for tasks assessed/performed       Past Medical History:  Diagnosis Date  . Allergy   . Arthritis     Past Surgical History:  Procedure Laterality Date  . TOTAL ABDOMINAL HYSTERECTOMY W/ BILATERAL SALPINGOOPHORECTOMY      There were no vitals filed for this visit.  Subjective Assessment - 09/04/18 1157    Subjective  Patient reports her shoulder is doing well. She had no increased pain after yesterday. She has not had time to do her exercises. Her biggest problem continues to be her feet.     Limitations  Standing;Walking;House hold activities    How long can you sit comfortably?  30 minutes     How long can you stand comfortably?  1 hour    How long can you walk comfortably?  a couple of hours    Currently in Pain?  Yes    Pain Score  6     Pain Location  Foot    Pain Orientation  Right    Pain Descriptors / Indicators  Aching    Pain Type  Chronic pain    Pain Onset  More than a month ago    Pain Frequency  Constant    Aggravating Factors   standing, walking     Pain Relieving Factors  not working     Effect of Pain on Daily Activities  difficulty                        [facility] Adult PT Treatment/Exercise - 09/04/18 0001      Exercises   Exercises  Shoulder      Lumbar Exercises: Seated   Other Seated Lumbar  Exercises  ball squeeze with abdominal brace2x10       Shoulder Exercises: Sidelying   External Rotation Limitations  x10       Shoulder Exercises: Standing   External Rotation Limitations  yellow 2x10 with cuing     Internal Rotation Limitations  2x10 yellow with cuing     Extension Limitations  2x10 yellow     Row Limitations  2x10 yellow     Other Standing Exercises  shoulder flexion with UE ranger       Manual Therapy   Manual Therapy  Passive ROM;Joint mobilization;Soft tissue mobilization    Joint Mobilization  gentle PA and AP mobilization     Soft tissue mobilization  to upper trap and posterior shoulder     Passive ROM  into flexion and external rotation              PT Education - 09/04/18 1159    Education Details  importance of supportive feet     Person(s)  Educated  Patient    Methods  Explanation;Demonstration;Tactile cues;Verbal cues    Comprehension  Verbalized understanding;Returned demonstration;Verbal cues required;Tactile cues required;Need further instruction       PT Short Term Goals - 09/04/18 1209      PT SHORT TERM GOAL #1   Title  Pt will be independent in her HEP.     Time  3    Period  Weeks    Status  On-going      PT SHORT TERM GOAL #2   Title  Pt will be able to demonstrate correct lifting posture and mechanics for greater >/= 10 pounds.     Time  3    Period  Weeks    Status  On-going        PT Long Term Goals - 09/03/18 3785      PT LONG TERM GOAL #1   Title  Pt will be able to improve her FOTO from 53% limitation to </= 41 % limitiation.     Baseline  53% on 08/08/2018    Time  6    Period  Weeks    Status  On-going      PT LONG TERM GOAL #2   Title  Pt will improve her hamstring flexibility to >/= 90 degrees to improve functional mobility,    Time  6    Period  Weeks    Status  On-going      PT LONG TERM GOAL #3   Title  Pt will able to report pain </= 2/10 following a days work.     Baseline  currently pain can  vary from 4-8/10.    Time  6    Period  Weeks    Status  New      PT LONG TERM GOAL #4   Title  Pt will be able to improve lumbar flexion to >/= 70 degrees with no pain reported    Time  6    Period  Weeks    Status  New      PT LONG TERM GOAL #5   Title  Patient will reach behind her head and back without pain in order to perfrom ADL's     Time  4    Period  Weeks    Status  New    Target Date  10/01/18      Additional Long Term Goals   Additional Long Term Goals  Yes      PT LONG TERM GOAL #6   Title  Patient will put 2lb item on shelf overhead without pain in order to perfrom ADL's     Time  4    Period  Weeks    Status  New    Target Date  10/01/18            Plan - 09/04/18 1202    Clinical Impression Statement  Patient tolerated treatment well. Therapy added UE ranger and sidelying ER. She had no increase in pain. She was given wand flexion for home to maintain pain free motion. She was encouraged to start to get herself into a consitent exercise program.     Stability/Clinical Decision Making  Evolving/Moderate complexity    Rehab Potential  Good    PT Frequency  2x / week    PT Duration  4 weeks    PT Treatment/Interventions  ADLs/Self Care Home Management;Moist Heat;Cryotherapy;Traction;Therapeutic activities;Therapeutic exercise;Stair training;Functional mobility training;Neuromuscular re-education;Patient/family education;Manual techniques;Passive range of motion;Dry needling;Taping  PT Next Visit Plan  add neck and right shoulder at [facility] next week; needs modaliites added to POC;  Lumbar stretching, core strengthening, HS stretching    PT Home Exercise Plan  SKTC, Hamstring stretches, piriformis, figure 4, bridge with clam (green) , prone hip extension with knee straight, knee bent, cat/camel , clam , reverse clam; scap retraction; shoulder extension shoulder IR and ER    Consulted and Agree with Plan of Care  Patient       Patient will benefit from skilled  therapeutic intervention in order to improve the following deficits and impairments:  Pain, Postural dysfunction, Impaired flexibility, Decreased activity tolerance, Decreased strength, Decreased range of motion  Visit Diagnosis: Acute bilateral low back pain with left-sided sciatica  Muscle weakness (generalized)  Cervicalgia  Chronic right shoulder pain  Chronic left shoulder pain     Problem List Patient Active Problem List   Diagnosis Date Noted  . Cervical radiculopathy 07/29/2018  . DDD (degenerative disc disease), cervical 07/29/2018  . Adnexal mass 07/20/2015  . Pelvic pain in female 07/19/2015  . Otitis externa 03/16/2015  . Smoker 03/16/2015    Carney Living PT DPT  09/04/2018, 12:10 PM  [facility] [address] [place], [place], [address] Phone: [phone]   Fax:  [phone]  Name: Erica Walters MRN: [id number] Date of Birth: January 24, 1966

## 2018-09-08 ENCOUNTER — Encounter: Payer: Self-pay | Admitting: Physical Therapy

## 2018-09-08 ENCOUNTER — Ambulatory Visit: Payer: 59 | Admitting: Physical Therapy

## 2018-09-08 ENCOUNTER — Other Ambulatory Visit: Payer: Self-pay

## 2018-09-08 DIAGNOSIS — M25512 Pain in left shoulder: Secondary | ICD-10-CM

## 2018-09-08 DIAGNOSIS — M5442 Lumbago with sciatica, left side: Secondary | ICD-10-CM | POA: Diagnosis not present

## 2018-09-08 DIAGNOSIS — M25511 Pain in right shoulder: Secondary | ICD-10-CM

## 2018-09-08 DIAGNOSIS — G8929 Other chronic pain: Secondary | ICD-10-CM

## 2018-09-08 DIAGNOSIS — M542 Cervicalgia: Secondary | ICD-10-CM

## 2018-09-08 DIAGNOSIS — M6281 Muscle weakness (generalized): Secondary | ICD-10-CM

## 2018-09-08 NOTE — Progress Notes (Signed)
Subjective:   Patient ID: Erica Walters, female   DOB: 53 y.o.   MRN: [id number]   HPI Patient continues to experience intense discomfort plantar aspect right heel at the insertional point of the tendon into the calcaneus with inflammation and fluid around the medial band   ROS      Objective:  Physical Exam  Acute painful plantar fasciitis right with inflammation fluid of the medial band     Assessment:  Exquisite discomfort when I pressed into the plantar fascial medial band     Plan:  H&P reviewed condition and at this time I did a sterile prep and injected the fascia 3 mg Kenalog 5 mg Xylocaine medial side and then due to the tenseness of discomfort I did apply air fracture walker to completely immobilize with instructions on usage.  Reappoint 3 weeks to recheck

## 2018-09-09 ENCOUNTER — Encounter: Payer: Self-pay | Admitting: Physical Therapy

## 2018-09-09 NOTE — Therapy (Signed)
[place], [place], [address] Phone: [phone]   Fax:  [phone]  Physical Therapy Treatment  Patient Details  Name: Erica Walters MRN: [id number] Date of Birth: 1966-02-15 Referring Provider (PT): Delsa Grana, PA-C   Encounter Date: 09/08/2018  PT End of Session - 09/09/18 0928    Visit Number  7    Number of Visits  12    Date for PT Re-Evaluation  09/30/18    Authorization Type  UHC     PT Start Time  1031    PT Stop Time  1625    PT Time Calculation (min)  40 min    Activity Tolerance  Patient tolerated treatment well    Behavior During Therapy  [facility] for tasks assessed/performed       Past Medical History:  Diagnosis Date  . Allergy   . Arthritis     Past Surgical History:  Procedure Laterality Date  . TOTAL ABDOMINAL HYSTERECTOMY W/ BILATERAL SALPINGOOPHORECTOMY      There were no vitals filed for this visit.  Subjective Assessment - 09/08/18 1610    Subjective  Patient reports her neck, shoulder and back are OK today. She had some pain after her last treatment. She felt a "catching"" in her shoulder.     Pertinent History  hysterectomy    Limitations  Standing;Walking;House hold activities    How long can you sit comfortably?  30 minutes     How long can you stand comfortably?  1 hour    How long can you walk comfortably?  a couple of hours    Currently in Pain?  No/denies    Pain Score  --                       [facility] Adult PT Treatment/Exercise - 09/09/18 0001      Exercises   Exercises  Shoulder      Lumbar Exercises: Seated   Other Seated Lumbar Exercises  ball squeeze with abdominal brace2x10       Shoulder Exercises: Sidelying   External Rotation Limitations  2x10       Shoulder Exercises: Standing   External Rotation Limitations  red 2x10 with cuing     Internal Rotation Limitations  2x10 red with cuing     Extension Limitations  2x10 red    Row  Limitations  2x10 red     Other Standing Exercises  shoulder flexion with UE ranger x20      Manual Therapy   Manual Therapy  Passive ROM;Joint mobilization;Soft tissue mobilization    Joint Mobilization  gentle PA and AP mobilization     Soft tissue mobilization  to upper trap and posterior shoulder     Passive ROM  into flexion and external rotation              PT Education - 09/09/18 0927    Education Details  improtance of ther-ex. cuing for technique     Person(s) Educated  Patient    Methods  Explanation;Demonstration;Tactile cues;Verbal cues    Comprehension  Verbalized understanding;Returned demonstration;Verbal cues required;Tactile cues required       PT Short Term Goals - 09/09/18 0931      PT SHORT TERM GOAL #1   Title  Pt will be independent in her HEP.     Time  3    Period  Weeks    Status  On-going  PT SHORT TERM GOAL #2   Title  Pt will be able to demonstrate correct lifting posture and mechanics for greater >/= 10 pounds.     Time  3    Period  Weeks    Status  On-going        PT Long Term Goals - 09/03/18 1829      PT LONG TERM GOAL #1   Title  Pt will be able to improve her FOTO from 53% limitation to </= 41 % limitiation.     Baseline  53% on 08/08/2018    Time  6    Period  Weeks    Status  On-going      PT LONG TERM GOAL #2   Title  Pt will improve her hamstring flexibility to >/= 90 degrees to improve functional mobility,    Time  6    Period  Weeks    Status  On-going      PT LONG TERM GOAL #3   Title  Pt will able to report pain </= 2/10 following a days work.     Baseline  currently pain can vary from 4-8/10.    Time  6    Period  Weeks    Status  New      PT LONG TERM GOAL #4   Title  Pt will be able to improve lumbar flexion to >/= 70 degrees with no pain reported    Time  6    Period  Weeks    Status  New      PT LONG TERM GOAL #5   Title  Patient will reach behind her head and back without pain in order to  perfrom ADL's     Time  4    Period  Weeks    Status  New    Target Date  10/01/18      Additional Long Term Goals   Additional Long Term Goals  Yes      PT LONG TERM GOAL #6   Title  Patient will put 2lb item on shelf overhead without pain in order to perfrom ADL's     Time  4    Period  Weeks    Status  New    Target Date  10/01/18            Plan - 09/09/18 0929    Clinical Impression Statement  Patients range was limited to 110 degrees at first wbut with mobilization she had full shoulder range. she was ecnoruaged to maintain her range at home. Therapy continues to progress her exercises. She requires cuing for technique.     Stability/Clinical Decision Making  Evolving/Moderate complexity    Clinical Decision Making  Low    Rehab Potential  Good    PT Frequency  2x / week    PT Duration  4 weeks    PT Treatment/Interventions  ADLs/Self Care Home Management;Moist Heat;Cryotherapy;Traction;Therapeutic activities;Therapeutic exercise;Stair training;Functional mobility training;Neuromuscular re-education;Patient/family education;Manual techniques;Passive range of motion;Dry needling;Taping    PT Next Visit Plan  add neck and right shoulder at ERO next week; needs modaliites added to POC;  Lumbar stretching, core strengthening, HS stretching    PT Home Exercise Plan  SKTC, Hamstring stretches, piriformis, figure 4, bridge with clam (green) , prone hip extension with knee straight, knee bent, cat/camel , clam , reverse clam; scap retraction; shoulder extension shoulder IR and ER    Consulted and Agree with Plan of Care  Patient  Patient will benefit from skilled therapeutic intervention in order to improve the following deficits and impairments:  Pain, Postural dysfunction, Impaired flexibility, Decreased activity tolerance, Decreased strength, Decreased range of motion  Visit Diagnosis: Acute bilateral low back pain with left-sided sciatica  Cervicalgia  Muscle  weakness (generalized)  Chronic right shoulder pain  Chronic left shoulder pain     Problem List Patient Active Problem List   Diagnosis Date Noted  . Cervical radiculopathy 07/29/2018  . DDD (degenerative disc disease), cervical 07/29/2018  . Adnexal mass 07/20/2015  . Pelvic pain in female 07/19/2015  . Otitis externa 03/16/2015  . Smoker 03/16/2015    Carney Living PT DPT  09/09/2018, 9:43 AM  [facility] [address] [place], [place], [address] Phone: [phone]   Fax:  [phone]  Name: Erica Walters MRN: [id number] Date of Birth: 1965/08/21

## 2018-09-10 ENCOUNTER — Ambulatory Visit: Payer: 59 | Admitting: Physical Therapy

## 2018-09-10 ENCOUNTER — Encounter: Payer: Self-pay | Admitting: Physical Therapy

## 2018-09-10 ENCOUNTER — Other Ambulatory Visit: Payer: Self-pay

## 2018-09-10 DIAGNOSIS — M6281 Muscle weakness (generalized): Secondary | ICD-10-CM

## 2018-09-10 DIAGNOSIS — M5442 Lumbago with sciatica, left side: Secondary | ICD-10-CM

## 2018-09-10 DIAGNOSIS — M25512 Pain in left shoulder: Secondary | ICD-10-CM

## 2018-09-10 DIAGNOSIS — G8929 Other chronic pain: Secondary | ICD-10-CM

## 2018-09-10 DIAGNOSIS — M542 Cervicalgia: Secondary | ICD-10-CM

## 2018-09-10 DIAGNOSIS — M25511 Pain in right shoulder: Secondary | ICD-10-CM

## 2018-09-11 NOTE — Therapy (Addendum)
[place], [place], [address] Phone: [phone]   Fax:  [phone]  Physical Therapy Treatment/Discharge   Patient Details  Name: Erica Walters MRN: [id number] Date of Birth: 17-Dec-1965 Referring Provider (PT): Delsa Grana, PA-C   Encounter Date: 09/10/2018  PT End of Session - 09/10/18 1549    Visit Number  8    Number of Visits  12    Date for PT Re-Evaluation  09/30/18    Authorization Type  [employer]     PT Start Time  3403    PT Stop Time  1630    PT Time Calculation (min)  41 min    Activity Tolerance  Patient tolerated treatment well    Behavior During Therapy  [facility] for tasks assessed/performed       Past Medical History:  Diagnosis Date  . Allergy   . Arthritis     Past Surgical History:  Procedure Laterality Date  . TOTAL ABDOMINAL HYSTERECTOMY W/ BILATERAL SALPINGOOPHORECTOMY      There were no vitals filed for this visit.  Subjective Assessment - 09/10/18 1550    Subjective  Pt denies pain today, but reports increased pain in R foot after working yesterday.     Pertinent History  hysterectomy    Limitations  Standing;Walking;House hold activities    How long can you sit comfortably?  30 minutes     How long can you stand comfortably?  1 hour    How long can you walk comfortably?  a couple of hours    Currently in Pain?  No/denies    Pain Score  0-No pain    Multiple Pain Sites  No                       [facility] Adult PT Treatment/Exercise - 09/11/18 0001      Shoulder Exercises: Supine   Horizontal ABduction  20 reps;Right;Strengthening   mid range horizontal ABD.    Horizontal ABduction Weight (lbs)  1    Flexion  20 reps;Right;Strengthening   mid range flexion    Shoulder Flexion Weight (lbs)  1      Shoulder Exercises: Standing   Protraction Limitations  on wall press into ball  x 10   HEP   External Rotation Limitations  2 x 10 with cuing     Internal  Rotation Limitations  2 x 10 green with cuing for technique.     Extension Limitations  2 x 10 green     Row Limitations  2 x 10 green     Other Standing Exercises  wall wash with towel x 10 small circles each direction   HEP     Shoulder Exercises: ROM/Strengthening   UBE (Upper Arm Bike)  4 min forward, level 2.0       Manual Therapy   Manual Therapy  Passive ROM;Joint mobilization   R shoulder   Manual therapy comments  R shoulder inferior posterior mobilization    Passive ROM  R shoulder PROM into flexion and external rotation              PT Education - 09/11/18 1352    Education Details  reviewed HEp and symptom management     Person(s) Educated  Patient    Methods  Explanation;Demonstration;Tactile cues;Verbal cues    Comprehension  Verbalized understanding;Returned demonstration;Verbal cues required;Need further instruction       PT Short Term Goals - 09/09/18 7096  PT SHORT TERM GOAL #1   Title  Pt will be independent in her HEP.     Time  3    Period  Weeks    Status  On-going      PT SHORT TERM GOAL #2   Title  Pt will be able to demonstrate correct lifting posture and mechanics for greater >/= 10 pounds.     Time  3    Period  Weeks    Status  On-going        PT Long Term Goals - 09/03/18 1478      PT LONG TERM GOAL #1   Title  Pt will be able to improve her FOTO from 53% limitation to </= 41 % limitiation.     Baseline  53% on 08/08/2018    Time  6    Period  Weeks    Status  On-going      PT LONG TERM GOAL #2   Title  Pt will improve her hamstring flexibility to >/= 90 degrees to improve functional mobility,    Time  6    Period  Weeks    Status  On-going      PT LONG TERM GOAL #3   Title  Pt will able to report pain </= 2/10 following a days work.     Baseline  currently pain can vary from 4-8/10.    Time  6    Period  Weeks    Status  New      PT LONG TERM GOAL #4   Title  Pt will be able to improve lumbar flexion to >/= 70  degrees with no pain reported    Time  6    Period  Weeks    Status  New      PT LONG TERM GOAL #5   Title  Patient will reach behind her head and back without pain in order to perfrom ADL's     Time  4    Period  Weeks    Status  New    Target Date  10/01/18      Additional Long Term Goals   Additional Long Term Goals  Yes      PT LONG TERM GOAL #6   Title  Patient will put 2lb item on shelf overhead without pain in order to perfrom ADL's     Time  4    Period  Weeks    Status  New    Target Date  10/01/18            Plan - 09/10/18 1551    Clinical Impression Statement  Following Manual therapy, R shoulder PROM flexion improved from 100 degress with pain at end range to full range of motion. Progressed theraband shoulder ther ex  from red to green indicating improved tolerance to ther ex.     Stability/Clinical Decision Making  Evolving/Moderate complexity    Clinical Decision Making  Low    Rehab Potential  Good    PT Frequency  2x / week    PT Duration  4 weeks    PT Treatment/Interventions  ADLs/Self Care Home Management;Moist Heat;Cryotherapy;Traction;Therapeutic activities;Therapeutic exercise;Stair training;Functional mobility training;Neuromuscular re-education;Patient/family education;Manual techniques;Passive range of motion;Dry needling;Taping    PT Next Visit Plan   Lumbar stretching, core strengthening, HS stretching    PT Home Exercise Plan  SKTC, Hamstring stretches, piriformis, figure 4, bridge with clam (green) , prone hip extension with knee straight, knee bent, cat/camel ,  clam , reverse clam; scap retraction; shoulder extension shoulder IR and ER, L shoulder protraction and small circles on wall.     Consulted and Agree with Plan of Care  Patient      PHYSICAL THERAPY DISCHARGE SUMMARY  Visits from Start of Care: 9  Current functional level related to goals / functional outcomes: Improved symptoms but had to stop 2nd to covid-19   Remaining  deficits: Continued pain but [employer] / Equipment: HEP  Plan: Patient agrees to discharge.  Patient goals were partially met. Patient is being discharged due to meeting the stated rehab goals.  ?????       Patient will benefit from skilled therapeutic intervention in order to improve the following deficits and impairments:  Pain, Postural dysfunction, Impaired flexibility, Decreased activity tolerance, Decreased strength, Decreased range of motion  Visit Diagnosis: Acute bilateral low back pain with left-sided sciatica  Cervicalgia  Muscle weakness (generalized)  Chronic left shoulder pain  Chronic right shoulder pain     Problem List Patient Active Problem List   Diagnosis Date Noted  . Cervical radiculopathy 07/29/2018  . DDD (degenerative disc disease), cervical 07/29/2018  . Adnexal mass 07/20/2015  . Pelvic pain in female 07/19/2015  . Otitis externa 03/16/2015  . Smoker 03/16/2015    Carney Living  PT DPT  09/11/2018, 1:58 PM  [facility] [address] [place], [place], [address] Phone: [phone]   Fax:  [phone]  Name: Jahnavi Muratore MRN: [id number] Date of Birth: 08-01-1965

## 2018-09-15 ENCOUNTER — Ambulatory Visit: Payer: 59 | Admitting: Physical Therapy

## 2018-09-17 ENCOUNTER — Ambulatory Visit: Payer: 59 | Admitting: Physical Therapy

## 2018-09-18 DIAGNOSIS — S46091D Other injury of muscle(s) and tendon(s) of the rotator cuff of right shoulder, subsequent encounter: Secondary | ICD-10-CM | POA: Diagnosis not present

## 2018-09-18 DIAGNOSIS — M47812 Spondylosis without myelopathy or radiculopathy, cervical region: Secondary | ICD-10-CM | POA: Diagnosis not present

## 2018-09-18 DIAGNOSIS — M722 Plantar fascial fibromatosis: Secondary | ICD-10-CM | POA: Diagnosis not present

## 2018-09-22 ENCOUNTER — Ambulatory Visit: Payer: 59 | Admitting: Physical Therapy

## 2018-09-24 ENCOUNTER — Ambulatory Visit: Payer: 59 | Admitting: Physical Therapy

## 2018-09-25 ENCOUNTER — Ambulatory Visit: Payer: 59 | Admitting: Podiatry

## 2018-09-29 ENCOUNTER — Encounter: Payer: Self-pay | Admitting: Podiatry

## 2018-09-29 ENCOUNTER — Other Ambulatory Visit: Payer: Self-pay

## 2018-09-29 ENCOUNTER — Ambulatory Visit (INDEPENDENT_AMBULATORY_CARE_PROVIDER_SITE_OTHER): Payer: 59 | Admitting: Podiatry

## 2018-09-29 VITALS — Temp 97.9°F

## 2018-09-29 DIAGNOSIS — M722 Plantar fascial fibromatosis: Secondary | ICD-10-CM

## 2018-09-29 MED ORDER — TRIAMCINOLONE ACETONIDE 10 MG/ML IJ SUSP
10.0000 mg | Freq: Once | INTRAMUSCULAR | Status: AC
  Administered 2018-09-29: 10 mg

## 2018-09-29 NOTE — Progress Notes (Signed)
Subjective:   Patient ID: Erica Walters, female   DOB: 53 y.o.   MRN: [id number]   HPI Patient continues to experience discomfort in the lateral side of the right heel stating the medial side of the heel seems to be improved quite a bit and has been wearing the boot but not all the time   ROS      Objective:  Physical Exam  Neurovascular status intact with plantar medial pain doing well in the heel with patient having plantar lateral pain at the insertion of the fascial band into the lateral calcaneus     Assessment:  Possibility for lateral fascial pain with compensation from the medial pain that she was experiencing     Plan:  H&P condition reviewed and did sterile prep and went ahead and injected the lateral fascial band 3 mg Kenalog 5 mg Xylocaine advised on physical therapy anti-inflammatories continued boot usage and reappoint in 3 weeks

## 2018-10-01 ENCOUNTER — Telehealth: Payer: Self-pay | Admitting: Physical Therapy

## 2018-10-01 NOTE — Telephone Encounter (Signed)
Erica Walters was contacted today regarding the temporary reduction of OP Rehab Services due to concerns for community transmission of Covid-19.     Therapist advised the patient to continue to perform their HEP and assured they had no unanswered questions at this time.  The patient was offered and declined the continuation in their POC by using methods such as an e-visit, virtual check in, or telehealth visit.    Outpatient Rehabilitation Services will follow up with this client when we are able to safely resume care at the [facility] in person.   Patient is aware we can be reached by telephone during limited business hours in the meantime.

## 2018-10-20 ENCOUNTER — Other Ambulatory Visit: Payer: Self-pay

## 2018-10-20 ENCOUNTER — Ambulatory Visit (INDEPENDENT_AMBULATORY_CARE_PROVIDER_SITE_OTHER): Payer: 59 | Admitting: Podiatry

## 2018-10-20 ENCOUNTER — Encounter: Payer: Self-pay | Admitting: Podiatry

## 2018-10-20 VITALS — Temp 97.3°F

## 2018-10-20 DIAGNOSIS — M722 Plantar fascial fibromatosis: Secondary | ICD-10-CM | POA: Diagnosis not present

## 2018-10-22 NOTE — Progress Notes (Signed)
Subjective:   Patient ID: Erica Walters, female   DOB: 53 y.o.   MRN: [id number]   HPI Patient states that the heel is feeling real well with minimal discomfort and states that for the first time she feels like we will progress is being made   ROS      Objective:  Physical Exam  Neurovascular status intact negative Homans sign plantar heel improving with discomfort still upon deep palpation but overall is continuing to accelerate     Assessment:  Very happy at this point with where the pain is with what appears to be a reduction of the inflammatory process     Plan:  Reviewed at great length the continued utilization of anti-inflammatory supportive shoe gear usage and stretching exercises.  I am going to discharge and will see back as needed

## 2018-10-29 ENCOUNTER — Telehealth: Payer: Self-pay | Admitting: Physical Therapy

## 2018-10-29 NOTE — Telephone Encounter (Signed)
Spoke with patient- she would like to hold off on PT for now. Signal cut and phone call not completed.  Adriana Quinby C. Karolina Zamor PT, DPT 10/29/18 2:15 PM

## 2018-11-26 ENCOUNTER — Telehealth: Payer: Self-pay | Admitting: Family Medicine

## 2018-11-26 NOTE — Telephone Encounter (Signed)
Patient called requesting an updated referral to [facility] for Dr. Maia Petties at Spine and Scoliosis center. Referral was submitted on [facility]. Referral ID: [id number]. Referral dates are 11/26/2018-05/25/2019. Referral is for 6 visits.

## 2018-12-01 ENCOUNTER — Telehealth: Payer: Self-pay

## 2018-12-01 NOTE — Telephone Encounter (Signed)
Called patient regarding recall colonoscopy.  Patient would like to wait until later as she has family coming in July.  Please send reminder for August.  Peter Congo

## 2018-12-16 ENCOUNTER — Telehealth: Payer: Self-pay | Admitting: Family Medicine

## 2018-12-16 DIAGNOSIS — M722 Plantar fascial fibromatosis: Secondary | ICD-10-CM

## 2018-12-16 NOTE — Telephone Encounter (Signed)
Patient would like a referral to [facility] with dr Paulla Dolly if possible, and needs to be done asap for insurance reasons  [phone]

## 2018-12-16 NOTE — Telephone Encounter (Signed)
Do you want me to put in referral?

## 2018-12-16 NOTE — Telephone Encounter (Signed)
Yes - thank you

## 2018-12-24 ENCOUNTER — Ambulatory Visit (INDEPENDENT_AMBULATORY_CARE_PROVIDER_SITE_OTHER): Payer: 59 | Admitting: Podiatry

## 2018-12-24 ENCOUNTER — Ambulatory Visit (INDEPENDENT_AMBULATORY_CARE_PROVIDER_SITE_OTHER): Payer: 59

## 2018-12-24 ENCOUNTER — Other Ambulatory Visit: Payer: Self-pay

## 2018-12-24 ENCOUNTER — Encounter: Payer: Self-pay | Admitting: Podiatry

## 2018-12-24 VITALS — Temp 97.7°F

## 2018-12-24 DIAGNOSIS — M109 Gout, unspecified: Secondary | ICD-10-CM

## 2018-12-24 DIAGNOSIS — R600 Localized edema: Secondary | ICD-10-CM

## 2018-12-24 DIAGNOSIS — M722 Plantar fascial fibromatosis: Secondary | ICD-10-CM

## 2018-12-24 DIAGNOSIS — M25572 Pain in left ankle and joints of left foot: Secondary | ICD-10-CM

## 2018-12-24 DIAGNOSIS — M25571 Pain in right ankle and joints of right foot: Secondary | ICD-10-CM

## 2018-12-24 MED ORDER — PREDNISONE 10 MG PO TABS: ORAL_TABLET | ORAL | 0 refills | Status: DC

## 2018-12-25 NOTE — Progress Notes (Signed)
Subjective:   Patient ID: Erica Walters, female   DOB: 53 y.o.   MRN: [id number]   HPI Patient presents stating that her heels have started to hurt and she is also getting some swelling in her feet if she is been on that long   ROS      Objective:  Physical Exam  Neurovascular status intact with patient found to have inflammation of the plantar fascial bilateral that is present with improvement upon deep palpation but quite sore still especially with activity.  Found to have edema in the ankle region bilateral and this is been somewhat chronic in nature     Assessment:  Acute plantar fasciitis bilateral with moderate edema symptomatology bilateral     Plan:  H&P conditions reviewed at today I injected the fascia 3 mg Kenalog 5 mg Xylocaine applied fascial compression bilateral with instructions on usage.  Patient will be seen back 4 weeks to recheck.  Also because of the swelling the patient is getting and just generalized pain I did go ahead today and I did send him for a full arthritic profile to try to see if I can find any indications as to why she gets the swelling and other issues she experiences

## 2018-12-31 LAB — RHEUMATOID FACTOR: Rheumatoid fact SerPl-aCnc: <14 IU/mL (ref <14)

## 2018-12-31 LAB — SEDIMENTATION RATE: Sed Rate: 14 mm/h (ref 0–30)

## 2018-12-31 LAB — BASIC METABOLIC PANEL
BUN: 18 mg/dL (ref 7–25)
CO2: 23 mmol/L (ref 20–32)
Calcium: 9.4 mg/dL (ref 8.6–10.4)
Chloride: 106 mmol/L (ref 98–110)
Creat: 0.64 mg/dL (ref 0.50–1.05)
Glucose, Bld: 106 mg/dL — ABNORMAL HIGH (ref 65–99)
Potassium: 4.6 mmol/L (ref 3.5–5.3)
Sodium: 140 mmol/L (ref 135–146)

## 2018-12-31 LAB — C-REACTIVE PROTEIN: CRP: 4.7 mg/L (ref <8.0)

## 2018-12-31 LAB — URIC ACID: Uric Acid, Serum: 3.7 mg/dL (ref 2.5–7.0)

## 2018-12-31 LAB — ANA: Anti Nuclear Antibody (ANA): NEGATIVE

## 2019-01-01 ENCOUNTER — Telehealth: Payer: Self-pay | Admitting: *Deleted

## 2019-01-01 NOTE — Telephone Encounter (Signed)
Pt called again following up on previous request. Pt would like to know if her lab results are in.

## 2019-01-01 NOTE — Telephone Encounter (Signed)
Pt called for blood work results.

## 2019-01-01 NOTE — Telephone Encounter (Signed)
I informed pt of Dr. Mellody Drown review of results and reminded of 01/07/2019 4:15pm appt.

## 2019-01-01 NOTE — Telephone Encounter (Signed)
Everything was normal on her bloodwork

## 2019-01-07 ENCOUNTER — Other Ambulatory Visit: Payer: Self-pay

## 2019-01-07 ENCOUNTER — Ambulatory Visit (INDEPENDENT_AMBULATORY_CARE_PROVIDER_SITE_OTHER): Payer: 59 | Admitting: Podiatry

## 2019-01-07 ENCOUNTER — Encounter: Payer: Self-pay | Admitting: Podiatry

## 2019-01-07 ENCOUNTER — Ambulatory Visit: Payer: 59 | Admitting: Podiatry

## 2019-01-07 VITALS — Temp 97.6°F

## 2019-01-07 DIAGNOSIS — M722 Plantar fascial fibromatosis: Secondary | ICD-10-CM | POA: Diagnosis not present

## 2019-01-20 ENCOUNTER — Encounter: Payer: Self-pay | Admitting: Gastroenterology

## 2019-01-22 ENCOUNTER — Other Ambulatory Visit: Payer: Self-pay

## 2019-01-23 ENCOUNTER — Ambulatory Visit (INDEPENDENT_AMBULATORY_CARE_PROVIDER_SITE_OTHER): Payer: 59 | Admitting: Family Medicine

## 2019-01-23 ENCOUNTER — Encounter: Payer: Self-pay | Admitting: Family Medicine

## 2019-01-23 VITALS — BP 120/88 | HR 91 | Temp 97.7°F | Resp 18 | Ht 66.5 in | Wt 185.4 lb

## 2019-01-23 DIAGNOSIS — E663 Overweight: Secondary | ICD-10-CM | POA: Diagnosis not present

## 2019-01-23 DIAGNOSIS — E782 Mixed hyperlipidemia: Secondary | ICD-10-CM | POA: Diagnosis not present

## 2019-01-23 DIAGNOSIS — D369 Benign neoplasm, unspecified site: Secondary | ICD-10-CM | POA: Diagnosis not present

## 2019-01-23 DIAGNOSIS — M722 Plantar fascial fibromatosis: Secondary | ICD-10-CM | POA: Diagnosis not present

## 2019-01-23 DIAGNOSIS — F17200 Nicotine dependence, unspecified, uncomplicated: Secondary | ICD-10-CM

## 2019-01-23 DIAGNOSIS — Z780 Asymptomatic menopausal state: Secondary | ICD-10-CM

## 2019-01-23 DIAGNOSIS — G8929 Other chronic pain: Secondary | ICD-10-CM

## 2019-01-23 DIAGNOSIS — M2550 Pain in unspecified joint: Secondary | ICD-10-CM

## 2019-01-23 DIAGNOSIS — Z1239 Encounter for other screening for malignant neoplasm of breast: Secondary | ICD-10-CM

## 2019-01-23 DIAGNOSIS — E559 Vitamin D deficiency, unspecified: Secondary | ICD-10-CM

## 2019-01-23 MED ORDER — DULOXETINE HCL 20 MG PO CPEP: 20.0000 mg | ORAL_CAPSULE | Freq: Every day | ORAL | 1 refills | Status: DC

## 2019-01-23 MED ORDER — HYDROCODONE-ACETAMINOPHEN 5-325 MG PO TABS: 1.0000 | ORAL_TABLET | Freq: Four times a day (QID) | ORAL | 0 refills | Status: AC | PRN

## 2019-01-23 NOTE — Progress Notes (Addendum)
Patient ID: Erica Walters, female    DOB: 1965-10-27, 53 y.o.   MRN: [id number]  PCP: Susy Frizzle, MD  Chief Complaint  Patient presents with  . Plantar Fasciitis    6 month f/u    Subjective:   Erica Walters is a 53 y.o. female, presents to clinic with CC of 6 month follow up.  She transferred care to me late last year after PCP left practice.  We have addressed multiple pain and MSK issues, which are now managed by specialists - spine and podiatry.  She previously had elevated cholesterol which she desired to stop meds and work on lifestyle changes.  She has been in constant pain with back and feet - though her back and shoulder pain has been improving, and she has been working a lot (works at [employer]) work stressful, longer hours, more days, and switching to nights.  The treatments done by podiatry have not improved her pain and shes actually experienced worse foot pain with new inflammation and swelling. Recent arthritic work up with labs was negative - checked uric acid, ana, CRP, sed rate, Rh factor and bmp.    When reviewing her care gaps and med hx today - she is over due for repeated colonoscopy, doin.g yearly screening due to high grade dysplasia on rectal polyp and multiple other adenomatous polyps removed.  Referral put in for her to go back to Dr. Loletha Carrow.  She reports family hx of colon cancer  When asking about pelvic exams/pap which chart states she is due for - she reports she went through "early menopause" around age 57, and three years ago had TAB-BSO.  She never took replacement hormones and she is not supplementing with Vit D or calcium and over that past several years has had a lot of joint and MSK pain and arthritis.    Cholesterol was previously elevated in early 2019 with past PCP, she denies having any past SE with the statin medicines.  She is not working on lifestyle changes right now, but since last visit has lost 6 lbs.  She says that's from working so  much, working nights and not eating normally, with some loss of appetite "with stomach in knots."  If cholesterol is still elevated she agrees to try meds again if needed.  She asks if she can have her thyroid levels checked because of random sweats and hot flashes, also concerned about her weight and swelling.  She reports family hx of thyroid problems.  She denies any hair or skin changes, Most swelling occurs with activity.  She denies hot/cold intolerance (although it is nearly 90 degrees and high humidity and she is wearing jeans a shirt and layered over it is a long woven sweater).  No lumps or swelling noticed in her neck.  No HA, palpitations.  For chronic back pain she tried lyrica in the past, did not like SE.  She continues to have severe flares of pain, has be given several oral steroid prescriptions in the past couple months, and steroid injections.  She is taking NSAIDs and muscle relaxers for pain.  Most days pain is still constant and moderate to severe.  Mammogram over due as of April No recent pelvic exams or CPE done Moods are a little worse than normal right now due to stressors with work, her schedule and her foot pain.  PHQ done today.   Patient Active Problem List   Diagnosis Date Noted  . Multiple adenomatous polyps  01/23/2019  . Plantar fasciitis, right 01/23/2019  . Hyperlipidemia, mixed 01/23/2019  . Cervical radiculopathy 07/29/2018  . DDD (degenerative disc disease), cervical 07/29/2018  . Benign neoplasm of left ovary 08/11/2015  . Smoker 03/16/2015    Current Meds  Medication Sig  . cyclobenzaprine (FLEXERIL) 5 MG tablet TK 1 TO 2 TS PO Q 8 TO 12 H PRN  . ibuprofen (ADVIL,MOTRIN) 200 MG tablet Take 200 mg by mouth every 6 (six) hours as needed.  Marland Kitchen OVER THE COUNTER MEDICATION Infla-650. Two capsules daily.  . predniSONE (DELTASONE) 10 MG tablet 12 day tapering dose   Current Facility-Administered Medications for the 01/23/19 encounter (Office Visit) with  Delsa Grana, PA-C  Medication  . 0.9 %  sodium chloride infusion     Review of Systems  Constitutional: Negative.   HENT: Negative.   Eyes: Negative.   Respiratory: Negative.   Cardiovascular: Negative.   Gastrointestinal: Negative.   Endocrine: Negative.   Genitourinary: Negative.   Musculoskeletal: Negative.   Skin: Negative.   Allergic/Immunologic: Negative.   Neurological: Negative.   Hematological: Negative.   Psychiatric/Behavioral: Negative.   All other systems reviewed and are negative.      Objective:    Vitals:   01/23/19 0906  BP: 120/88  Pulse: 91  Resp: 18  Temp: 97.7 F (36.5 C)  TempSrc: Oral  SpO2: 97%  Weight: 185 lb 6.4 oz (84.1 kg)  Height: 5' 6.5" (1.689 m)      Physical Exam Vitals signs and nursing note reviewed.  Constitutional:      General: She is not in acute distress.    Appearance: Normal appearance. She is well-developed. She is not ill-appearing, toxic-appearing or diaphoretic.  HENT:     Head: Normocephalic and atraumatic.     Right Ear: External ear normal.     Left Ear: External ear normal.     Mouth/Throat:     Mouth: Mucous membranes are dry.     Pharynx: Oropharynx is clear. Uvula midline.  Eyes:     General: Lids are normal. No scleral icterus.    Conjunctiva/sclera: Conjunctivae normal.     Pupils: Pupils are equal, round, and reactive to light.  Neck:     Musculoskeletal: Normal range of motion and neck supple.     Trachea: Phonation normal. No tracheal deviation.  Cardiovascular:     Rate and Rhythm: Normal rate and regular rhythm.     Pulses: Normal pulses.          Radial pulses are 2+ on the right side and 2+ on the left side.       Posterior tibial pulses are 2+ on the right side and 2+ on the left side.     Heart sounds: Normal heart sounds. No murmur. No friction rub. No gallop.   Pulmonary:     Effort: Pulmonary effort is normal. No respiratory distress.     Breath sounds: Normal breath sounds. No  stridor. No wheezing, rhonchi or rales.  Chest:     Chest wall: No tenderness.  Abdominal:     General: Bowel sounds are normal. There is no distension.     Palpations: Abdomen is soft.     Tenderness: There is no abdominal tenderness. There is no guarding or rebound.  Musculoskeletal:        General: No deformity.  Lymphadenopathy:     Cervical: No cervical adenopathy.  Skin:    General: Skin is warm and dry.     Capillary  Refill: Capillary refill takes less than 2 seconds.     Coloration: Skin is not pale.     Findings: No rash.  Neurological:     Mental Status: She is alert and oriented to person, place, and time.     Motor: No abnormal muscle tone.     Gait: Gait abnormal.  Psychiatric:        Attention and Perception: Attention normal.        Mood and Affect: Mood is depressed. Mood is not anxious. Affect is not labile, flat, angry or tearful.        Speech: Speech normal.        Behavior: Behavior normal. Behavior is cooperative.        Thought Content: Thought content normal. Thought content does not include homicidal or suicidal ideation. Thought content does not include homicidal or suicidal plan.        Cognition and Memory: Cognition normal.        Judgment: Judgment normal.     Depression screen [facility] 2/9 01/23/2019 09/26/2017  Decreased Interest 0 0  Down, Depressed, Hopeless 0 0  PHQ - 2 Score 0 0  Altered sleeping 0 -  Tired, decreased energy 2 -  Change in appetite 1 -  Feeling bad or failure about yourself  0 -  Trouble concentrating 0 -  Moving slowly or fidgety/restless 0 -  Suicidal thoughts 0 -  PHQ-9 Score 3 -  Difficult doing work/chores Not difficult at all -        Assessment & Plan:   Problem List Items Addressed This Visit      Musculoskeletal and Integument   Plantar fasciitis, right    Managed by podiatry - Dr. Paulla Dolly may need referral for insoles/orthotics - pt trying to verify with podiatry office (her insurance requires PCP referral for  most things - reviewed chart today and cannot find order or referral from Dr. Paulla Dolly, so pt will f/up)      Relevant Medications   DULoxetine (CYMBALTA) 20 MG capsule   HYDROcodone-acetaminophen (NORCO/VICODIN) 5-325 MG tablet     Other   Multiple adenomatous polyps    Follow up with Dr. Loletha Carrow - due for repeat colonoscopy Has family hx, discussed importance of monitoring      Relevant Orders   Ambulatory referral to Gastroenterology   Hyperlipidemia, mixed - Primary    Recheck lipid panel and CMP - elevated in past, pt refused statin earlier this year. She is not currently working on lifestyle changes, has severe pain causing limitations on exercise.   agrees to try statin and follow up in Dr. Dennard Schaumann if cholesterol is still elevated 3 month f/up scheduled today Cardiovascular risk factors include sedentary lifestyle, current smoker, early menopause and b/l oophorectomy      Relevant Orders   Lipid panel   COMPLETE METABOLIC PANEL WITH GFR    Other Visit Diagnoses    Overweight (BMI 25.0-29.9)       counseled on diet/calorie reduction, weight decreasing since last visit, 6 lbs, may be interested in meds to help since exercise limited by ortho conditions   Relevant Orders   TSH   Lipid panel   COMPLETE METABOLIC PANEL WITH GFR   Hemoglobin A1c   T4, free   T3, free   Polyarthralgia       Work up started by podiatry, neg arthritis panel, possibly some related to early menopause/not supplementing Ca/VitD? other causes?   Relevant Medications   DULoxetine (CYMBALTA)  20 MG capsule   HYDROcodone-acetaminophen (NORCO/VICODIN) 5-325 MG tablet   Other Relevant Orders   VITAMIN D 25 Hydroxy (Vit-D Deficiency, Fractures)   Screening for malignant neoplasm of breast       overdue - order entered   Relevant Orders   MM Digital Screening   Vitamin D deficiency       screening, avoids dairy, works nights, does not supplement    Relevant Orders   VITAMIN D 25 Hydroxy (Vit-D  Deficiency, Fractures)   Postmenopausal estrogen deficiency       supplement Vit D and calcium, checking levels today, screening bone density?     Relevant Orders   VITAMIN D 25 Hydroxy (Vit-D Deficiency, Fractures)   Current smoker       Other chronic pain       trial of cymbalta for pain, may also help moods, 3 month f/up   Relevant Medications   DULoxetine (CYMBALTA) 20 MG capsule   HYDROcodone-acetaminophen (NORCO/VICODIN) 5-325 MG tablet      Pt to follow up with Dr. Dennard Schaumann in 3 months for recheck of cymbalta, cholesterol, weight.    Delsa Grana, PA-C 01/23/19 4:03 PM

## 2019-01-23 NOTE — Assessment & Plan Note (Signed)
Recheck lipid panel and CMP - elevated in past, pt refused statin earlier this year. She is not currently working on lifestyle changes, has severe pain causing limitations on exercise.   agrees to try statin and follow up in Dr. Dennard Schaumann if cholesterol is still elevated 3 month f/up scheduled today Cardiovascular risk factors include sedentary lifestyle, current smoker, early menopause and b/l oophorectomy

## 2019-01-23 NOTE — Assessment & Plan Note (Signed)
Managed by podiatry - Dr. Paulla Dolly may need referral for insoles/orthotics - pt trying to verify with podiatry office (her insurance requires PCP referral for most things - reviewed chart today and cannot find order or referral from Dr. Paulla Dolly, so pt will f/up)

## 2019-01-23 NOTE — Assessment & Plan Note (Signed)
Follow up with Dr. Loletha Carrow - due for repeat colonoscopy Has family hx, discussed importance of monitoring

## 2019-01-23 NOTE — Patient Instructions (Addendum)
3 month f/up with Dr. Dennard Schaumann  Calcium 1200 mg daily  Vit D 1000 IU daily In multiple doses will help you absorb more  Will call you with lab results - I expect that we will need to start a cholesterol lowering medicine again, and 3 month follow up with Dr. Dennard Schaumann   Health Maintenance for Postmenopausal Women Menopause is a normal process in which your ability to get pregnant comes to an end. This process happens slowly over many months or years, usually between the ages of 59 and 73. Menopause is complete when you have missed your menstrual periods for 12 months. It is important to talk with your health care provider about some of the most common conditions that affect women after menopause (postmenopausal women). These include heart disease, cancer, and bone loss (osteoporosis). Adopting a healthy lifestyle and getting preventive care can help to promote your health and wellness. The actions you take can also lower your chances of developing some of these common conditions. What should I know about menopause? During menopause, you may get a number of symptoms, such as:  Hot flashes. These can be moderate or severe.  Night sweats.  Decrease in sex drive.  Mood swings.  Headaches.  Tiredness.  Irritability.  Memory problems.  Insomnia. Choosing to treat or not to treat these symptoms is a decision that you make with your health care provider. Do I need hormone replacement therapy?  Hormone replacement therapy is effective in treating symptoms that are caused by menopause, such as hot flashes and night sweats.  Hormone replacement carries certain risks, especially as you become older. If you are thinking about using estrogen or estrogen with progestin, discuss the benefits and risks with your health care provider. What is my risk for heart disease and stroke? The risk of heart disease, heart attack, and stroke increases as you age. One of the causes may be a change in the  body's hormones during menopause. This can affect how your body uses dietary fats, triglycerides, and cholesterol. Heart attack and stroke are medical emergencies. There are many things that you can do to help prevent heart disease and stroke. Watch your blood pressure  High blood pressure causes heart disease and increases the risk of stroke. This is more likely to develop in people who have high blood pressure readings, are of African descent, or are overweight.  Have your blood pressure checked: ? Every 3-5 years if you are 37-52 years of age. ? Every year if you are 63 years old or older. Eat a healthy diet   Eat a diet that includes plenty of vegetables, fruits, low-fat dairy products, and lean protein.  Do not eat a lot of foods that are high in solid fats, added sugars, or sodium. Get regular exercise Get regular exercise. This is one of the most important things you can do for your health. Most adults should:  Try to exercise for at least 150 minutes each week. The exercise should increase your heart rate and make you sweat (moderate-intensity exercise).  Try to do strengthening exercises at least twice each week. Do these in addition to the moderate-intensity exercise.  Spend less time sitting. Even light physical activity can be beneficial. Other tips  Work with your health care provider to achieve or maintain a healthy weight.  Do not use any products that contain nicotine or tobacco, such as cigarettes, e-cigarettes, and chewing tobacco. If you need help quitting, ask your health care provider.  Know your  numbers. Ask your health care provider to check your cholesterol and your blood sugar (glucose). Continue to have your blood tested as directed by your health care provider. Do I need screening for cancer? Depending on your health history and family history, you may need to have cancer screening at different stages of your life. This may include screening for:  Breast  cancer.  Cervical cancer.  Lung cancer.  Colorectal cancer. What is my risk for osteoporosis? After menopause, you may be at increased risk for osteoporosis. Osteoporosis is a condition in which bone destruction happens more quickly than new bone creation. To help prevent osteoporosis or the bone fractures that can happen because of osteoporosis, you may take the following actions:  If you are 48-43 years old, get at least 1,000 mg of calcium and at least 600 mg of vitamin D per day.  If you are older than age 58 but younger than age 74, get at least 1,200 mg of calcium and at least 600 mg of vitamin D per day.  If you are older than age 65, get at least 1,200 mg of calcium and at least 800 mg of vitamin D per day. Smoking and drinking excessive alcohol increase the risk of osteoporosis. Eat foods that are rich in calcium and vitamin D, and do weight-bearing exercises several times each week as directed by your health care provider. How does menopause affect my mental health? Depression may occur at any age, but it is more common as you become older. Common symptoms of depression include:  Low or sad mood.  Changes in sleep patterns.  Changes in appetite or eating patterns.  Feeling an overall lack of motivation or enjoyment of activities that you previously enjoyed.  Frequent crying spells. Talk with your health care provider if you think that you are experiencing depression. General instructions See your health care provider for regular wellness exams and vaccines. This may include:  Scheduling regular health, dental, and eye exams.  Getting and maintaining your vaccines. These include: ? Influenza vaccine. Get this vaccine each year before the flu season begins. ? Pneumonia vaccine. ? Shingles vaccine. ? Tetanus, diphtheria, and pertussis (Tdap) booster vaccine. Your health care provider may also recommend other immunizations. Tell your health care provider if you have ever  been abused or do not feel safe at home. Summary  Menopause is a normal process in which your ability to get pregnant comes to an end.  This condition causes hot flashes, night sweats, decreased interest in sex, mood swings, headaches, or lack of sleep.  Treatment for this condition may include hormone replacement therapy.  Take actions to keep yourself healthy, including exercising regularly, eating a healthy diet, watching your weight, and checking your blood pressure and blood sugar levels.  Get screened for cancer and depression. Make sure that you are up to date with all your vaccines. This information is not intended to replace advice given to you by your health care provider. Make sure you discuss any questions you have with your health care provider. Document Released: 08/03/2005 Document Revised: 06/04/2018 Document Reviewed: 06/04/2018 Elsevier Patient Education  2020 [employer].

## 2019-01-27 ENCOUNTER — Other Ambulatory Visit: Payer: Self-pay | Admitting: Family Medicine

## 2019-01-27 DIAGNOSIS — R7989 Other specified abnormal findings of blood chemistry: Secondary | ICD-10-CM

## 2019-01-27 MED ORDER — ROSUVASTATIN CALCIUM 10 MG PO TABS: 10.0000 mg | ORAL_TABLET | Freq: Every day | ORAL | 1 refills | Status: DC

## 2019-01-28 LAB — TEST AUTHORIZATION

## 2019-01-28 LAB — LIPID PANEL
Cholesterol: 247 mg/dL — ABNORMAL HIGH (ref <200)
HDL: 45 mg/dL — ABNORMAL LOW (ref >=50)
LDL Cholesterol (Calc): 171 mg/dL (calc) — ABNORMAL HIGH
Non-HDL Cholesterol (Calc): 202 mg/dL (calc) — ABNORMAL HIGH (ref <130)
Total CHOL/HDL Ratio: 5.5 (calc) — ABNORMAL HIGH (ref <5.0)
Triglycerides: 162 mg/dL — ABNORMAL HIGH (ref <150)

## 2019-01-28 LAB — T3, FREE: T3, Free: 3.8 pg/mL (ref 2.3–4.2)

## 2019-01-28 LAB — COMPLETE METABOLIC PANEL WITH GFR
AG Ratio: 1.9 (calc) (ref 1.0–2.5)
ALT: 62 U/L — ABNORMAL HIGH (ref 6–29)
AST: 44 U/L — ABNORMAL HIGH (ref 10–35)
Albumin: 4.5 g/dL (ref 3.6–5.1)
Alkaline phosphatase (APISO): 91 U/L (ref 37–153)
BUN: 11 mg/dL (ref 7–25)
CO2: 24 mmol/L (ref 20–32)
Calcium: 9.7 mg/dL (ref 8.6–10.4)
Chloride: 106 mmol/L (ref 98–110)
Creat: 0.81 mg/dL (ref 0.50–1.05)
GFR, Est African American: 97 mL/min/{1.73_m2} (ref >=60)
GFR, Est Non African American: 84 mL/min/{1.73_m2} (ref >=60)
Globulin: 2.4 g/dL (calc) (ref 1.9–3.7)
Glucose, Bld: 107 mg/dL — ABNORMAL HIGH (ref 65–99)
Potassium: 4.8 mmol/L (ref 3.5–5.3)
Sodium: 141 mmol/L (ref 135–146)
Total Bilirubin: 0.5 mg/dL (ref 0.2–1.2)
Total Protein: 6.9 g/dL (ref 6.1–8.1)

## 2019-01-28 LAB — TSH: TSH: 1.46 mIU/L

## 2019-01-28 LAB — HEPATITIS PANEL, ACUTE
Hep A IgM: NONREACTIVE
Hep B C IgM: NONREACTIVE
Hepatitis B Surface Ag: NONREACTIVE
Hepatitis C Ab: NONREACTIVE
SIGNAL TO CUT-OFF: 0.01 (ref <1.00)

## 2019-01-28 LAB — HEMOGLOBIN A1C
Hgb A1c MFr Bld: 5.7 % of total Hgb — ABNORMAL HIGH (ref <5.7)
Mean Plasma Glucose: 117 (calc)
eAG (mmol/L): 6.5 (calc)

## 2019-01-28 LAB — T4, FREE: Free T4: 1.5 ng/dL (ref 0.8–1.8)

## 2019-01-28 LAB — VITAMIN D 25 HYDROXY (VIT D DEFICIENCY, FRACTURES): Vit D, 25-Hydroxy: 31 ng/mL (ref 30–100)

## 2019-01-29 ENCOUNTER — Ambulatory Visit (INDEPENDENT_AMBULATORY_CARE_PROVIDER_SITE_OTHER): Payer: 59 | Admitting: Orthotics

## 2019-01-29 ENCOUNTER — Other Ambulatory Visit: Payer: Self-pay

## 2019-01-29 DIAGNOSIS — M25571 Pain in right ankle and joints of right foot: Secondary | ICD-10-CM

## 2019-01-29 DIAGNOSIS — M25572 Pain in left ankle and joints of left foot: Secondary | ICD-10-CM

## 2019-01-29 DIAGNOSIS — M722 Plantar fascial fibromatosis: Secondary | ICD-10-CM | POA: Diagnosis not present

## 2019-01-29 DIAGNOSIS — M109 Gout, unspecified: Secondary | ICD-10-CM

## 2019-01-29 NOTE — Progress Notes (Signed)
Patient came into today to be cast for Custom Foot Orthotics. Upon recommendation of Dr. Paulla Dolly Patient presents with b/l foot pain, along arch.  Goals are rear foot stability, long arch support, cushioning Plan vendor

## 2019-02-02 ENCOUNTER — Ambulatory Visit (INDEPENDENT_AMBULATORY_CARE_PROVIDER_SITE_OTHER): Payer: 59 | Admitting: Podiatry

## 2019-02-02 ENCOUNTER — Other Ambulatory Visit: Payer: Self-pay

## 2019-02-02 ENCOUNTER — Encounter: Payer: Self-pay | Admitting: Podiatry

## 2019-02-02 VITALS — Temp 97.2°F

## 2019-02-02 DIAGNOSIS — M722 Plantar fascial fibromatosis: Secondary | ICD-10-CM | POA: Diagnosis not present

## 2019-02-02 DIAGNOSIS — M779 Enthesopathy, unspecified: Secondary | ICD-10-CM | POA: Diagnosis not present

## 2019-02-02 MED ORDER — GABAPENTIN 300 MG PO CAPS: 300.0000 mg | ORAL_CAPSULE | Freq: Three times a day (TID) | ORAL | 3 refills | Status: DC

## 2019-02-03 NOTE — Progress Notes (Signed)
Subjective:   Patient ID: Erica Walters, female   DOB: 53 y.o.   MRN: [id number]   HPI Patient states her heel continues to bother her some but she is also getting quite a bit of pain on the lateral dorsal aspect of the ankle joint that may be from walking differently or other condition which may be present   ROS      Objective:  Physical Exam  Patient's heel remains moderately tender when pressed from a plantar direction at the insertion with patient found to have been quite a bit of inflammation of the subtalar joint right extending into more distal direction     Assessment:  Inflammatory capsulitis right of the sinus tarsi with lateral inflammation of the tendon group with moderate fascial symptomatology still present right 8     Plan:  NP discussed both conditions and I did go ahead today and I injected the sinus tarsi right 3 mg Kenalog 5 mg Xylocaine and applied continue bracing and patient will pick up orthotics in the next couple weeks and be seen back 3 weeks or earlier if needed

## 2019-02-03 NOTE — Patient Instructions (Signed)
Her

## 2019-02-13 ENCOUNTER — Other Ambulatory Visit: Payer: Self-pay

## 2019-02-13 ENCOUNTER — Ambulatory Visit (INDEPENDENT_AMBULATORY_CARE_PROVIDER_SITE_OTHER): Payer: 59 | Admitting: Family Medicine

## 2019-02-13 ENCOUNTER — Encounter: Payer: Self-pay | Admitting: Family Medicine

## 2019-02-13 VITALS — BP 126/74 | HR 97 | Temp 97.9°F | Resp 16 | Ht 66.5 in | Wt 185.0 lb

## 2019-02-13 DIAGNOSIS — E782 Mixed hyperlipidemia: Secondary | ICD-10-CM | POA: Diagnosis not present

## 2019-02-13 DIAGNOSIS — M2550 Pain in unspecified joint: Secondary | ICD-10-CM

## 2019-02-13 DIAGNOSIS — F17200 Nicotine dependence, unspecified, uncomplicated: Secondary | ICD-10-CM | POA: Diagnosis not present

## 2019-02-13 NOTE — Progress Notes (Signed)
Subjective:    Patient ID: Erica Walters, female    DOB: 1965/07/20, 53 y.o.   MRN: [id number]  HPI  Patient is here today to establish care with me.  She declines her flu shot.  She is complaining of pain all throughout her body.  At the present time her pain is primarily in both feet.  She is seen podiatry.  She has had cortisone injections for plantar fasciitis which provided her only 24 hours of relief.  The pain is located all over her foot.  It is in the balls of her feet.  It radiates to the heels.  It radiates on top of the feet.  It radiates down into the toes and up the shins.  The migratory pattern certainly suggest neuropathic pain.  She also complains of some numbness in her toes at times.  She is currently on gabapentin 300 mg p.o. nightly.  She has never tried taking it during the day.  The pain is present in her feet whether she is on her feet or off of her feet.  She quit Cymbalta that my partner had put her on due to joint pains that the Cymbalta caused.  She could not tolerate Lyrica.  She also has pain in her neck, her shoulders, and in her lower back.  I question if the patient may have fibromyalgia.  She also has recently started Crestor.  Please see cholesterol panel obtained in July.  She denies any new myalgias on Crestor.  She also continues to smoke  Past Medical History:  Diagnosis Date  . Allergy   . Arthritis   . Hyperlipidemia    Past Surgical History:  Procedure Laterality Date  . TOTAL ABDOMINAL HYSTERECTOMY W/ BILATERAL SALPINGOOPHORECTOMY     Current Outpatient Medications on File Prior to Visit  Medication Sig Dispense Refill  . cyclobenzaprine (FLEXERIL) 5 MG tablet TK 1 TO 2 TS PO Q 8 TO 12 H PRN    . gabapentin (NEURONTIN) 300 MG capsule Take 1 capsule (300 mg total) by mouth 3 (three) times daily. 90 capsule 3  . ibuprofen (ADVIL,MOTRIN) 200 MG tablet Take 200 mg by mouth every 6 (six) hours as needed.    Marland Kitchen OVER THE COUNTER MEDICATION Infla-650. Two  capsules daily.    . rosuvastatin (CRESTOR) 10 MG tablet Take 1 tablet (10 mg total) by mouth daily. 90 tablet 1  . DULoxetine (CYMBALTA) 20 MG capsule Take 1 capsule (20 mg total) by mouth at bedtime. (Patient not taking: Reported on 02/13/2019) 90 capsule 1   Current Facility-Administered Medications on File Prior to Visit  Medication Dose Route Frequency Provider Last Rate Last Dose  . 0.9 %  sodium chloride infusion  500 mL Intravenous Once Doran Stabler, MD       No Known Allergies Social History   Socioeconomic History  . Marital status: Married    Spouse name: Not on file  . Number of children: Not on file  . Years of education: Not on file  . Highest education level: Not on file  Occupational History  . Not on file  Social Needs  . Financial resource strain: Not on file  . Food insecurity    Worry: Not on file    Inability: Not on file  . Transportation needs    Medical: Not on file    Non-medical: Not on file  Tobacco Use  . Smoking status: Current Every Day Smoker    Packs/day: 0.50  Years: 26.00    Pack years: 13.00  . Smokeless tobacco: Never Used  Substance and Sexual Activity  . Alcohol use: Yes    Alcohol/week: 0.0 standard drinks    Comment: daily wine   . Drug use: Never  . Sexual activity: Not Currently  Lifestyle  . Physical activity    Days per week: Not on file    Minutes per session: Not on file  . Stress: Not on file  Relationships  . Social Herbalist on phone: Not on file    Gets together: Not on file    Attends religious service: Not on file    Active member of club or organization: Not on file    Attends meetings of clubs or organizations: Not on file    Relationship status: Not on file  . Intimate partner violence    Fear of current or ex partner: Not on file    Emotionally abused: Not on file    Physically abused: Not on file    Forced sexual activity: Not on file  Other Topics Concern  . Not on file  Social  History Narrative  . Not on file     Review of Systems  All other systems reviewed and are negative.      Objective:   Physical Exam Vitals signs reviewed.  Constitutional:      General: She is not in acute distress.    Appearance: Normal appearance. She is not ill-appearing or toxic-appearing.  Cardiovascular:     Rate and Rhythm: Normal rate and regular rhythm.     Pulses: Normal pulses.     Heart sounds: Normal heart sounds. No murmur. No friction rub. No gallop.   Pulmonary:     Effort: Pulmonary effort is normal.     Breath sounds: Normal breath sounds. No wheezing, rhonchi or rales.  Abdominal:     General: Abdomen is flat. Bowel sounds are normal. There is no distension.     Palpations: Abdomen is soft. There is no mass.     Tenderness: There is no abdominal tenderness. There is no guarding or rebound.     Hernia: No hernia is present.  Musculoskeletal:     Right lower leg: No edema.     Left lower leg: No edema.  Neurological:     Mental Status: She is alert.           Assessment & Plan:  The primary encounter diagnosis was Hyperlipidemia, mixed. Diagnoses of Current smoker and Polyarthralgia were also pertinent to this visit. Recommended smoking cessation.  Recheck fasting lipid panel in November.  Blood pressure today is exceptional.  Recommended flu shot but the patient declined.  I believe the patient has fibromyalgia.  I recommended trying to increase the frequency of the gabapentin as tolerated.  If she cannot tolerate that, I would try other medications for neuropathic pain such as nortriptyline.  Patient will email me and give me an update.

## 2019-02-19 ENCOUNTER — Other Ambulatory Visit: Payer: Self-pay

## 2019-02-19 ENCOUNTER — Ambulatory Visit: Payer: 59 | Admitting: Orthotics

## 2019-02-19 DIAGNOSIS — M722 Plantar fascial fibromatosis: Secondary | ICD-10-CM

## 2019-02-19 DIAGNOSIS — M779 Enthesopathy, unspecified: Secondary | ICD-10-CM

## 2019-02-19 NOTE — Progress Notes (Signed)
Patient came in today to pick up custom made foot orthotics.  The goals were accomplished and the patient reported no dissatisfaction with said orthotics.  Patient was advised of breakin period and how to report any issues. 

## 2019-03-16 ENCOUNTER — Encounter: Payer: Self-pay | Admitting: Podiatry

## 2019-03-16 ENCOUNTER — Telehealth: Payer: Self-pay | Admitting: Podiatry

## 2019-03-16 ENCOUNTER — Ambulatory Visit (INDEPENDENT_AMBULATORY_CARE_PROVIDER_SITE_OTHER): Payer: 59 | Admitting: Podiatry

## 2019-03-16 ENCOUNTER — Other Ambulatory Visit: Payer: Self-pay

## 2019-03-16 DIAGNOSIS — M109 Gout, unspecified: Secondary | ICD-10-CM | POA: Diagnosis not present

## 2019-03-16 DIAGNOSIS — M722 Plantar fascial fibromatosis: Secondary | ICD-10-CM

## 2019-03-16 MED ORDER — TRAMADOL HCL 50 MG PO TABS: 50.0000 mg | ORAL_TABLET | Freq: Three times a day (TID) | ORAL | 2 refills | Status: DC

## 2019-03-16 MED ORDER — CELECOXIB 200 MG PO CAPS: 200.0000 mg | ORAL_CAPSULE | Freq: Two times a day (BID) | ORAL | 3 refills | Status: DC

## 2019-03-16 NOTE — Telephone Encounter (Signed)
Pt was seen in office today and was supposed to have 2 prescriptions sent to her pharmacy but they only received one.  Pharmacy received Celebrex but did not receive the Tramadol.

## 2019-03-16 NOTE — Progress Notes (Signed)
Subjective:   Patient ID: Erica Walters, female   DOB: 53 y.o.   MRN: [id number]   HPI Patient presents stating that her feet are still really hurting her and she gets a lot of shooting pain and she is going to see her spinal surgeon to check and see if her back is related to the pain she still has   ROS      Objective:  Physical Exam  Neurovascular status intact with discomfort in both feet with no descriptive area with what appears to be pain that is different areas around but not 1 specific spot     Assessment:  Fasciitis-like symptomatology with inflammatory capsulitis with no specific area that I can identify of pain     Plan:  H&P reviewed condition and at this point we are going to start her on tramadol and Celebrex as she has been taking medicine with good beneficial to help control her pain until she can see her spinal doctor and have MRI to try to rule out any kind of nerve impingement compression which could be part of her pain.  She will be seen back if symptoms were to persist

## 2019-03-16 NOTE — Telephone Encounter (Signed)
I informed pt of Dr. Paulla Dolly tramadol orders called to [facility].

## 2019-03-16 NOTE — Telephone Encounter (Signed)
Left message with Tramadol orders.

## 2019-03-17 ENCOUNTER — Telehealth: Payer: Self-pay | Admitting: Family Medicine

## 2019-03-17 NOTE — Telephone Encounter (Signed)
Patient requesting a referral back to [facility] she has been sent their before.  CB# [phone]

## 2019-03-18 NOTE — Telephone Encounter (Signed)
Ok to place this referral?

## 2019-03-19 ENCOUNTER — Other Ambulatory Visit: Payer: Self-pay | Admitting: Family Medicine

## 2019-03-19 DIAGNOSIS — M5030 Other cervical disc degeneration, unspecified cervical region: Secondary | ICD-10-CM

## 2019-03-19 DIAGNOSIS — M5412 Radiculopathy, cervical region: Secondary | ICD-10-CM

## 2019-03-19 DIAGNOSIS — M549 Dorsalgia, unspecified: Secondary | ICD-10-CM

## 2019-03-19 DIAGNOSIS — M5442 Lumbago with sciatica, left side: Secondary | ICD-10-CM

## 2019-03-19 NOTE — Telephone Encounter (Signed)
ok 

## 2019-03-19 NOTE — Telephone Encounter (Signed)
Spoke with patient and informed her of referral placement. Patient verbalized understanding

## 2019-04-01 ENCOUNTER — Other Ambulatory Visit: Payer: Self-pay | Admitting: Rehabilitation

## 2019-04-01 DIAGNOSIS — M47816 Spondylosis without myelopathy or radiculopathy, lumbar region: Secondary | ICD-10-CM

## 2019-04-10 ENCOUNTER — Other Ambulatory Visit: Payer: Self-pay | Admitting: Podiatry

## 2019-04-10 DIAGNOSIS — M722 Plantar fascial fibromatosis: Secondary | ICD-10-CM

## 2019-04-11 ENCOUNTER — Ambulatory Visit
Admission: RE | Admit: 2019-04-11 | Discharge: 2019-04-11 | Disposition: A | Discharge status: Home / Self Care | Payer: 59 | Source: Ambulatory Visit | Attending: Rehabilitation | Admitting: Rehabilitation

## 2019-04-11 ENCOUNTER — Other Ambulatory Visit: Payer: Self-pay

## 2019-04-11 DIAGNOSIS — M47816 Spondylosis without myelopathy or radiculopathy, lumbar region: Secondary | ICD-10-CM

## 2019-04-16 ENCOUNTER — Other Ambulatory Visit: Payer: Self-pay

## 2019-04-16 ENCOUNTER — Ambulatory Visit (AMBULATORY_SURGERY_CENTER): Payer: Self-pay | Admitting: *Deleted

## 2019-04-16 VITALS — Temp 96.9°F | Ht 66.5 in | Wt 193.0 lb

## 2019-04-16 DIAGNOSIS — Z86010 Personal history of colonic polyps: Secondary | ICD-10-CM

## 2019-04-16 DIAGNOSIS — Z1159 Encounter for screening for other viral diseases: Secondary | ICD-10-CM

## 2019-04-16 NOTE — Progress Notes (Signed)
Patient is here in-person for PV. Patient denies any allergies to eggs or soy. Patient denies any problems with anesthesia/sedation. Patient denies any oxygen use at home. Patient denies taking any diet/weight loss medications or blood thinners. Patient is not being treated for MRSA or C-diff. Patient request different prep than last time (plenvu). She states she vomit and did not feel good after drinking the prep.  She request Miralax.  Pt is aware that care partner will wait in the car during procedure; if they feel like they will be too hot or cold to wait in the car; they may wait in the 4 th floor lobby. Patient is aware to bring only one care partner. We want them to wear a mask (we do not have any that we can provide them), practice social distancing, and we will check their temperatures when they get here.  I did remind the patient that their care partner needs to stay in the parking lot the entire time and have a cell phone available, we will call them when the pt is ready for discharge. Patient will wear mask into building.  covid test on 11/9=pt is aware.

## 2019-04-28 ENCOUNTER — Encounter: Payer: Self-pay | Admitting: Gastroenterology

## 2019-04-30 ENCOUNTER — Telehealth: Payer: Self-pay | Admitting: *Deleted

## 2019-04-30 MED ORDER — GABAPENTIN 300 MG PO CAPS: 300.0000 mg | ORAL_CAPSULE | Freq: Three times a day (TID) | ORAL | 0 refills | Status: DC

## 2019-04-30 NOTE — Telephone Encounter (Signed)
[facility] - [address] states [employer] will only cove the Gabapentin if ordered as a 90 day supply. Dr. Josephina Shih the gabapentin 300mg  #270 one capsule tid.

## 2019-05-05 ENCOUNTER — Ambulatory Visit (INDEPENDENT_AMBULATORY_CARE_PROVIDER_SITE_OTHER): Payer: 59

## 2019-05-05 ENCOUNTER — Other Ambulatory Visit: Payer: Self-pay | Admitting: Gastroenterology

## 2019-05-05 DIAGNOSIS — Z1159 Encounter for screening for other viral diseases: Secondary | ICD-10-CM

## 2019-05-06 LAB — SARS CORONAVIRUS 2 (TAT 6-24 HRS): SARS Coronavirus 2: NEGATIVE

## 2019-05-07 ENCOUNTER — Encounter: Payer: Self-pay | Admitting: Gastroenterology

## 2019-05-07 ENCOUNTER — Other Ambulatory Visit: Payer: Self-pay

## 2019-05-07 ENCOUNTER — Ambulatory Visit (AMBULATORY_SURGERY_CENTER): Payer: 59 | Admitting: Gastroenterology

## 2019-05-07 VITALS — BP 147/97 | HR 83 | Temp 97.9°F | Resp 12 | Ht 66.0 in | Wt 193.0 lb

## 2019-05-07 DIAGNOSIS — D122 Benign neoplasm of ascending colon: Secondary | ICD-10-CM

## 2019-05-07 DIAGNOSIS — Z86010 Personal history of colonic polyps: Secondary | ICD-10-CM

## 2019-05-07 DIAGNOSIS — D123 Benign neoplasm of transverse colon: Secondary | ICD-10-CM

## 2019-05-07 DIAGNOSIS — D120 Benign neoplasm of cecum: Secondary | ICD-10-CM

## 2019-05-07 MED ORDER — SODIUM CHLORIDE 0.9 % IV SOLN: 500.0000 mL | INTRAVENOUS | Status: DC

## 2019-05-07 NOTE — Progress Notes (Signed)
PT taken to PACU. Monitors in place. VSS. Report given to RN. 

## 2019-05-07 NOTE — Op Note (Signed)
[facility] Patient Name: Erica Walters Procedure Date: 05/07/2019 8:05 AM MRN: [id number] Endoscopist: Mallie Mussel L. Loletha Carrow , MD Age: 53 Referring MD:  Date of Birth: 01/13/1966 Gender: Female Account #: [id number] Procedure:                Colonoscopy Indications:              Surveillance: History of numerous (9) adenomas on                            last colonoscopy (< 3 yrs; 11/2017), some polyps >                            38mm Medicines:                Monitored Anesthesia Care Procedure:                Pre-Anesthesia Assessment:                           - Prior to the procedure, a History and Physical                            was performed, and patient medications and                            allergies were reviewed. The patient's tolerance of                            previous anesthesia was also reviewed. The risks                            and benefits of the procedure and the sedation                            options and risks were discussed with the patient.                            All questions were answered, and informed consent                            was obtained. Prior Anticoagulants: The patient has                            taken no previous anticoagulant or antiplatelet                            agents. ASA Grade Assessment: II - A patient with                            mild systemic disease. After reviewing the risks                            and benefits, the patient was deemed in  satisfactory condition to undergo the procedure.                           After obtaining informed consent, the colonoscope                            was passed under direct vision. Throughout the                            procedure, the patient's blood pressure, pulse, and                            oxygen saturations were monitored continuously. The                            Colonoscope was introduced through the anus and                         advanced to the the cecum, identified by                            appendiceal orifice and ileocecal valve. The                            colonoscopy was performed without difficulty. The                            patient tolerated the procedure well. The quality                            of the bowel preparation was good. The ileocecal                            valve, appendiceal orifice, and rectum were                            photographed. Scope In: 8:19:33 AM Scope Out: 8:45:59 AM Scope Withdrawal Time: 0 hours 20 minutes 16 seconds  Total Procedure Duration: 0 hours 26 minutes 26 seconds  Findings:                 The perianal and digital rectal examinations were                            normal.                           A 5 mm polyp was found in the cecum. The polyp was                            flat. The polyp was removed with a piecemeal                            technique using a cold biopsy forceps. Resection  and retrieval were complete.                           Three sessile polyps were found in the mid                            transverse colon and proximal ascending colon. The                            polyps were 3 to 8 mm in size. These polyps were                            removed with a cold snare. Resection and retrieval                            were complete.                           The exam was otherwise without abnormality on                            direct and retroflexion views. Complications:            No immediate complications. Estimated Blood Loss:     Estimated blood loss was minimal. Impression:               - One 5 mm polyp in the cecum, removed piecemeal                            using a cold biopsy forceps. Resected and retrieved.                           - Three 3 to 8 mm polyps in the mid transverse                            colon and in the proximal ascending colon, removed                             with a cold snare. Resected and retrieved.                           - The examination was otherwise normal on direct                            and retroflexion views. Recommendation:           - Patient has a contact number available for                            emergencies. The signs and symptoms of potential                            delayed complications were discussed with the  patient. Return to normal activities tomorrow.                            Written discharge instructions were provided to the                            patient.                           - Resume previous diet.                           - Continue present medications.                           - Await pathology results.                           - Repeat colonoscopy is recommended for                            surveillance. The colonoscopy date will be                            determined after pathology results from today's                            exam become available for review. Henry L. Loletha Carrow, MD 05/07/2019 8:51:12 AM This report has been signed electronically.

## 2019-05-07 NOTE — Patient Instructions (Signed)
Discharge instructions given. Handouts on polyps. Resume previous medications. YOU HAD AN ENDOSCOPIC PROCEDURE TODAY AT [facility]:   Refer to the procedure report that was given to you for any specific questions about what was found during the examination.  If the procedure report does not answer your questions, please call your gastroenterologist to clarify.  If you requested that your care partner not be given the details of your procedure findings, then the procedure report has been included in a sealed envelope for you to review at your convenience later.  YOU SHOULD EXPECT: Some feelings of bloating in the abdomen. Passage of more gas than usual.  Walking can help get rid of the air that was put into your GI tract during the procedure and reduce the bloating. If you had a lower endoscopy (such as a colonoscopy or flexible sigmoidoscopy) you may notice spotting of blood in your stool or on the toilet paper. If you underwent a bowel prep for your procedure, you may not have a normal bowel movement for a few days.  Please Note:  You might notice some irritation and congestion in your nose or some drainage.  This is from the oxygen used during your procedure.  There is no need for concern and it should clear up in a day or so.  SYMPTOMS TO REPORT IMMEDIATELY:   Following lower endoscopy (colonoscopy or flexible sigmoidoscopy):  Excessive amounts of blood in the stool  Significant tenderness or worsening of abdominal pains  Swelling of the abdomen that is new, acute  Fever of 100F or higher   For urgent or emergent issues, a gastroenterologist can be reached at any hour by calling [phone].   DIET:  We do recommend a small meal at first, but then you may proceed to your regular diet.  Drink plenty of fluids but you should avoid alcoholic beverages for 24 hours.  ACTIVITY:  You should plan to take it easy for the rest of today and you should NOT DRIVE or use heavy  machinery until tomorrow (because of the sedation medicines used during the test).    FOLLOW UP: Our staff will call the number listed on your records 48-72 hours following your procedure to check on you and address any questions or concerns that you may have regarding the information given to you following your procedure. If we do not reach you, we will leave a message.  We will attempt to reach you two times.  During this call, we will ask if you have developed any symptoms of COVID 19. If you develop any symptoms (ie: fever, flu-like symptoms, shortness of breath, cough etc.) before then, please call [phone].  If you test positive for Covid 19 in the 2 weeks post procedure, please call and report this information to [name].    If any biopsies were taken you will be contacted by phone or by letter within the next 1-3 weeks.  Please call us at [phone] if you have not heard about the biopsies in 3 weeks.    SIGNATURES/CONFIDENTIALITY: You and/or your care partner have signed paperwork which will be entered into your electronic medical record.  These signatures attest to the fact that that the information above on your After Visit Summary has been reviewed and is understood.  Full responsibility of the confidentiality of this discharge information lies with you and/or your care-partner.

## 2019-05-11 ENCOUNTER — Telehealth: Payer: Self-pay | Admitting: *Deleted

## 2019-05-11 NOTE — Telephone Encounter (Signed)
1. Have you developed a fever since your procedure? no  2.   Have you had an respiratory symptoms (SOB or cough) since your procedure? no  3.   Have you tested positive for COVID 19 since your procedure no  4.   Have you had any family members/close contacts diagnosed with the COVID 19 since your procedure?  no   If yes to any of these questions please route to Joylene John, RN and Alphonsa Gin, Therapist, sports.  Follow up Call-  Call back number 05/07/2019 12/04/2017  Post procedure Call Back phone  # [phone]  Permission to leave phone message Yes Yes  Some recent data might be hidden     Patient questions:  Do you have a fever, pain , or abdominal swelling? No. Pain Score  0 *  Have you tolerated food without any problems? Yes.    Have you been able to return to your normal activities? Yes.    Do you have any questions about your discharge instructions: Diet   No. Medications  No. Follow up visit  No.  Do you have questions or concerns about your Care? No.  Actions: * If pain score is 4 or above: No action needed, pain <4.

## 2019-05-19 ENCOUNTER — Encounter: Payer: Self-pay | Admitting: Gastroenterology

## 2019-06-01 ENCOUNTER — Other Ambulatory Visit: Payer: Self-pay | Admitting: Rehabilitation

## 2019-06-01 DIAGNOSIS — S92314S Nondisplaced fracture of first metatarsal bone, right foot, sequela: Secondary | ICD-10-CM

## 2019-06-08 ENCOUNTER — Other Ambulatory Visit: Payer: Self-pay

## 2019-06-08 ENCOUNTER — Ambulatory Visit
Admission: RE | Admit: 2019-06-08 | Discharge: 2019-06-08 | Disposition: A | Discharge status: Home / Self Care | Payer: 59 | Source: Ambulatory Visit | Attending: Rehabilitation | Admitting: Rehabilitation

## 2019-06-08 DIAGNOSIS — S92314S Nondisplaced fracture of first metatarsal bone, right foot, sequela: Secondary | ICD-10-CM

## 2019-07-08 ENCOUNTER — Telehealth: Payer: Self-pay | Admitting: Family Medicine

## 2019-07-08 NOTE — Telephone Encounter (Signed)
Received a phone call from patient stating that she had an appointment with Dr. Maia Petties on yesterday 07/07/2019 and she need a referral to be submitted with [facility].Referral submitted on [facility] website. Referral was authorized. Effective dates: 06/26/2019-06/24/2020

## 2019-07-17 ENCOUNTER — Other Ambulatory Visit: Payer: Self-pay | Admitting: Family Medicine

## 2019-07-17 DIAGNOSIS — M722 Plantar fascial fibromatosis: Secondary | ICD-10-CM

## 2019-07-17 DIAGNOSIS — G8929 Other chronic pain: Secondary | ICD-10-CM

## 2019-07-17 DIAGNOSIS — M2550 Pain in unspecified joint: Secondary | ICD-10-CM

## 2019-07-20 ENCOUNTER — Other Ambulatory Visit: Payer: Self-pay | Admitting: Family Medicine

## 2019-07-21 ENCOUNTER — Other Ambulatory Visit: Payer: Self-pay

## 2019-07-21 ENCOUNTER — Telehealth: Payer: Self-pay | Admitting: Family Medicine

## 2019-07-21 DIAGNOSIS — M5417 Radiculopathy, lumbosacral region: Secondary | ICD-10-CM

## 2019-07-21 NOTE — Telephone Encounter (Signed)
Patient's referral submitted on [employer]. Referral ID: [id number]. Valid 07/21/2019-01/17/2020 to see Dr. Narda Amber at [facility]

## 2019-07-21 NOTE — Telephone Encounter (Signed)
sure

## 2019-07-21 NOTE — Telephone Encounter (Signed)
Patient called in stating that her neurosurgeon Dr. Maia Petties was referring her to Dr. Posey Pronto at [facility] for her back and foot pain. Per patient's insurance with [facility] patient requires a referral from PCP office. She is already scheduled for this appointment with Dr. Posey Pronto. Is it ok to submit this referral via [facility] on your behalf?

## 2019-07-23 ENCOUNTER — Other Ambulatory Visit: Payer: Self-pay | Admitting: *Deleted

## 2019-07-23 MED ORDER — ROSUVASTATIN CALCIUM 10 MG PO TABS: ORAL_TABLET | ORAL | 1 refills | Status: DC

## 2019-07-27 ENCOUNTER — Other Ambulatory Visit: Payer: Self-pay | Admitting: Podiatry

## 2019-08-05 ENCOUNTER — Other Ambulatory Visit: Payer: Self-pay

## 2019-08-05 ENCOUNTER — Ambulatory Visit (INDEPENDENT_AMBULATORY_CARE_PROVIDER_SITE_OTHER): Payer: 59 | Admitting: Neurology

## 2019-08-05 DIAGNOSIS — M5417 Radiculopathy, lumbosacral region: Secondary | ICD-10-CM

## 2019-08-05 NOTE — Procedures (Signed)
[facility]  [address], [place]  [place], [place] [address] Tel: [phone] Fax:  [phone] Test Date:  08/05/2019  Patient: Erica Walters DOB: December 24, 1965 Physician: Narda Amber, DO  Sex: Female Height: 5\' 0"  Ref Phys: Zonia Kief, MD  ID#: [id number] Temp: 32.0C Technician:    Patient Complaints: This is a 54 year old female referred for evaluation of chronic low back pain and bilateral feet pain and paresthesias.  NCV & EMG Findings: Electrodiagnostic testing of the right lower extremity and additional studies of the left shows: 1. Bilateral sural and superficial peroneal sensory responses are within normal limits. 2. Bilateral peroneal and tibial motor responses are within normal limits. 3. Bilateral tibial H reflex studies are within normal limits. 4. There is no evidence of active or chronic motor axonal changes affecting any of the tested muscles.  Motor unit configuration and recruitment pattern is within normal limits.  Impression: This is a normal study of the lower extremities.  In particular, there is no evidence of a sensorimotor polyneuropathy or lumbosacral radiculopathy.     ___________________________ Narda Amber, DO    Nerve Conduction Studies Anti Sensory Summary Table   Site NR Peak (ms) Norm Peak (ms) P-T Amp (V) Norm P-T Amp  Left Sup Peroneal Anti Sensory (Ant Lat Mall)  32C  12 cm    2.3 <4.6 20.3 >4  Right Sup Peroneal Anti Sensory (Ant Lat Mall)  32C  12 cm    2.3 <4.6 21.3 >4  Left Sural Anti Sensory (Lat Mall)  32C  Calf    3.5 <4.6 22.0 >4  Right Sural Anti Sensory (Lat Mall)  32C  Calf    2.5 <4.6 21.0 >4   Motor Summary Table   Site NR Onset (ms) Norm Onset (ms) O-P Amp (mV) Norm O-P Amp Site1 Site2 Delta-0 (ms) Dist (cm) Vel (m/s) Norm Vel (m/s)  Left Peroneal Motor (Ext Dig Brev)  32C  Ankle    4.0 <6.0 4.7 >2.5 B Fib Ankle 7.8 34.0 44 >40  B Fib    11.8  4.3  Poplt B Fib 1.5 8.0 53 >40  Poplt    13.3   4.2         Right Peroneal Motor (Ext Dig Brev)  32C  Ankle    3.2 <6.0 6.5 >2.5 B Fib Ankle 7.5 33.0 44 >40  B Fib    10.7  5.7  Poplt B Fib 1.6 8.0 50 >40  Poplt    12.3  5.3         Left Tibial Motor (Abd Hall Brev)  32C  Ankle    5.5 <6.0 10.2 >4 Knee Ankle 7.9 38.0 48 >40  Knee    13.4  7.0         Right Tibial Motor (Abd Hall Brev)  32C  Ankle    5.5 <6.0 8.5 >4 Knee Ankle 9.9 42.0 42 >40  Knee    15.4  5.0          H Reflex Studies   NR H-Lat (ms) Lat Norm (ms) L-R H-Lat (ms)  Left Tibial (Gastroc)  32C     30.75 <35 0.95  Right Tibial (Gastroc)  32C     31.70 <35 0.95   EMG   Side Muscle Ins Act Fibs Psw Fasc Number Recrt Dur Dur. Amp Amp. Poly Poly. Comment  Right AntTibialis Nml Nml Nml Nml Nml Nml Nml Nml Nml Nml Nml Nml N/A  Right Gastroc Nml Nml  Nml Nml Nml Nml Nml Nml Nml Nml Nml Nml N/A  Right Flex Dig Long Nml Nml Nml Nml Nml Nml Nml Nml Nml Nml Nml Nml N/A  Right RectFemoris Nml Nml Nml Nml Nml Nml Nml Nml Nml Nml Nml Nml N/A  Right GluteusMed Nml Nml Nml Nml Nml Nml Nml Nml Nml Nml Nml Nml N/A  Right Lumbo Parasp Low Nml Nml Nml Nml NE - - - - - - - N/A  Left AntTibialis Nml Nml Nml Nml Nml Nml Nml Nml Nml Nml Nml Nml N/A  Left Gastroc Nml Nml Nml Nml Nml Nml Nml Nml Nml Nml Nml Nml N/A  Left Flex Dig Long Nml Nml Nml Nml Nml Nml Nml Nml Nml Nml Nml Nml N/A  Left RectFemoris Nml Nml Nml Nml Nml Nml Nml Nml Nml Nml Nml Nml N/A  Left GluteusMed Nml Nml Nml Nml Nml Nml Nml Nml Nml Nml Nml Nml N/A      Waveforms:

## 2019-08-18 ENCOUNTER — Telehealth: Payer: Self-pay

## 2019-08-18 NOTE — Telephone Encounter (Signed)
Patient needs to contact the ordering provider, Dr. Zonia Kief, so he can go over results with her. He may want her to f/u in office.

## 2019-08-18 NOTE — Telephone Encounter (Signed)
Pt called wanted results from her NCV she had done on 08/05/2019. Pt called neuro and they told her to call our clinic for the results. It looks like results were sent to Dr. Posey Pronto. Please advise.

## 2019-08-18 NOTE — Telephone Encounter (Signed)
Pt.notified

## 2019-09-08 ENCOUNTER — Encounter: Payer: Self-pay | Admitting: Family Medicine

## 2019-09-08 ENCOUNTER — Ambulatory Visit (INDEPENDENT_AMBULATORY_CARE_PROVIDER_SITE_OTHER): Payer: 59 | Admitting: Family Medicine

## 2019-09-08 ENCOUNTER — Other Ambulatory Visit: Payer: Self-pay

## 2019-09-08 VITALS — BP 136/94 | HR 94 | Temp 96.4°F | Resp 16 | Ht 66.5 in | Wt 192.0 lb

## 2019-09-08 DIAGNOSIS — G609 Hereditary and idiopathic neuropathy, unspecified: Secondary | ICD-10-CM | POA: Diagnosis not present

## 2019-09-08 LAB — VITAMIN B12: Vitamin B-12: 309 pg/mL (ref 200–1100)

## 2019-09-08 MED ORDER — GABAPENTIN 100 MG PO CAPS: 100.0000 mg | ORAL_CAPSULE | Freq: Three times a day (TID) | ORAL | 3 refills | Status: DC

## 2019-09-08 NOTE — Progress Notes (Signed)
Subjective:    Patient ID: Erica Walters, female    DOB: Oct 05, 1965, 54 y.o.   MRN: [id number]  HPI  Approximately 2 years ago, the patient started developing numbness in the balls of her feet.  It started in her right foot.  She describes it as a sensation of walking around.  Shortly thereafter she developed an aching deep aching pain in the plantar aspects of both feet although the right was greater than the left.  She began seeing her podiatrist who ordered an MRI of the right foot which was normal.  The pain began spreading to the left foot at which point she was referred to a neurosurgeon.  Patient had an MRI of the lumbar spine which revealed mild bulging disc at multiple levels but no significant nerve impingement.  She was then referred to a neurologist who performed nerve conduction studies of the lower extremities which revealed no evidence of axonal polyneuropathy.  Patient has been treated with Cymbalta that made her feel "terrible" and she had stopped the medication.  She has been treated with nortriptyline.  She has been treated with gabapentin 300 mg twice a day although she cannot increase the dose because it makes her feel "stupid".  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  She believes that she may have also tried Lyrica in the past which made her feel poorly similar to the Cymbalta.  She had lab work last summer that check a hemoglobin A1c and a TSH which were normal.  I do not see any evidence that anyone is checked her B12 level.  Past Medical History:  Diagnosis Date  . Allergy   . Arthritis   . Hyperlipidemia    Past Surgical History:  Procedure Laterality Date  . COLONOSCOPY  12/04/2017  . TOTAL ABDOMINAL HYSTERECTOMY W/ BILATERAL SALPINGOOPHORECTOMY     Current Outpatient Medications on File Prior to Visit  Medication Sig Dispense Refill  . Calcium Citrate-Vitamin D (CALCIUM CITRATE + D PO) Take by mouth.      . cyclobenzaprine (FLEXERIL) 5 MG tablet TK 1 TO 2 TS PO Q 8 TO 12 H PRN    . gabapentin (NEURONTIN) 300 MG capsule TAKE 1 CAPSULE(300 MG) BY MOUTH THREE TIMES DAILY 270 capsule 0  . nortriptyline (PAMELOR) 10 MG capsule Take 10-20 mg by mouth at bedtime.    . rosuvastatin (CRESTOR) 10 MG tablet TAKE 1 TABLET(10 MG) BY MOUTH DAILY 90 tablet 1   No current facility-administered medications on file prior to visit.   No Known Allergies Social History   Socioeconomic History  . Marital status: Married    Spouse name: Not on file  . Number of children: Not on file  . Years of education: Not on file  . Highest education level: Not on file  Occupational History  . Not on file  Tobacco Use  . Smoking status: Current Every Day Smoker    Packs/day: 0.50    Years: 26.00    Pack years: 13.00    Types: Cigarettes  . Smokeless tobacco: Never Used  Substance and Sexual Activity  . Alcohol use: Yes    Alcohol/week: 14.0 standard drinks    Types: 14 Glasses of wine per week    Comment: daily wine per pt  . Drug use: Never  . Sexual activity: Not Currently  Other Topics Concern  . Not on file  Social History Narrative  . Not on  file   Social Determinants of Health   Financial Resource Strain:   . Difficulty of Paying Living Expenses:   Food Insecurity:   . Worried About Charity fundraiser in the Last Year:   . Arboriculturist in the Last Year:   Transportation Needs:   . Film/video editor (Medical):   Marland Kitchen Lack of Transportation (Non-Medical):   Physical Activity:   . Days of Exercise per Week:   . Minutes of Exercise per Session:   Stress:   . Feeling of Stress :   Social Connections:   . Frequency of Communication with Friends and Family:   . Frequency of Social Gatherings with Friends and Family:   . Attends Religious Services:   . Active Member of Clubs or Organizations:   . Attends Archivist Meetings:   Marland Kitchen Marital Status:   Intimate Partner Violence:   .  Fear of Current or Ex-Partner:   . Emotionally Abused:   Marland Kitchen Physically Abused:   . Sexually Abused:      Review of Systems  All other systems reviewed and are negative.      Objective:   Physical Exam Constitutional:      General: She is not in acute distress.    Appearance: She is obese. She is not ill-appearing or toxic-appearing.  Cardiovascular:     Rate and Rhythm: Normal rate and regular rhythm.     Heart sounds: Normal heart sounds.  Pulmonary:     Effort: Pulmonary effort is normal.     Breath sounds: Normal breath sounds.  Musculoskeletal:     Right foot: Normal range of motion. No deformity, bunion, Charcot foot or foot drop.     Left foot: Normal range of motion. No deformity, bunion, Charcot foot or foot drop.  Feet:     Right foot:     Skin integrity: Skin integrity normal. No ulcer, blister or skin breakdown.     Left foot:     Skin integrity: Skin integrity normal. No ulcer, blister or skin breakdown.  Neurological:     Mental Status: She is alert.           Assessment & Plan:  Idiopathic peripheral neuropathy - Plan: Vitamin B12  I believe the patient has idiopathic peripheral neuropathy given the fact that her A1c and TSH are normal.  She denies any exposure to heavy metals.  The MRI of her lumbar spine showed no significant nerve impingement.  The MRI of the right foot was normal.  I do not feel that the patient has cervical stenosis as she has very little symptoms in the upper extremities and her nerve conduction test were normal.  I will check a vitamin B12 level to rule out B12 deficiency.  I have recommended trying adding 100 mg of gabapentin in the middle of day so that she will take 300 in the morning and 100 in the middle of the day, and 300 in the afternoon.  I have recommended that she slowly uptitrate on the gabapentin as tolerated to hopefully 300 mg 3 times a day to afford better pain control.  Consider neurology referral if worsening and no  improvement is seen

## 2019-10-23 ENCOUNTER — Other Ambulatory Visit: Payer: Self-pay | Admitting: Podiatry

## 2019-11-06 ENCOUNTER — Telehealth: Payer: Self-pay | Admitting: Family Medicine

## 2019-11-06 DIAGNOSIS — G609 Hereditary and idiopathic neuropathy, unspecified: Secondary | ICD-10-CM

## 2019-11-06 NOTE — Telephone Encounter (Signed)
CB# [phone] Pt would like for Dr.Pickard place a  referral to be seen by a  Neurologist

## 2019-11-06 NOTE — Telephone Encounter (Signed)
Patient has not seen a Neurologist before she has seen neurosurgery.

## 2019-11-06 NOTE — Telephone Encounter (Signed)
Can you follow up with this pt. Looks like her referral is good until July. Thanks

## 2019-11-09 NOTE — Telephone Encounter (Signed)
Pt states that her pain is not better and would like to see a Neurologist. The pervious neuro referral was for a test only she did not see a neurologists at that time. Per LOV pt can have referral. Referral placed.

## 2019-11-10 ENCOUNTER — Encounter: Payer: Self-pay | Admitting: Neurology

## 2019-11-10 NOTE — Telephone Encounter (Signed)
I am fine with neurology referral but I would increase gabapentin to 300 tid.

## 2019-11-11 ENCOUNTER — Other Ambulatory Visit: Payer: Self-pay | Admitting: Family Medicine

## 2019-11-11 MED ORDER — GABAPENTIN 300 MG PO CAPS: ORAL_CAPSULE | ORAL | 0 refills | Status: DC

## 2019-11-11 NOTE — Telephone Encounter (Signed)
Pt aware and med sent to pharm 

## 2019-12-01 ENCOUNTER — Other Ambulatory Visit: Payer: Self-pay | Admitting: Family Medicine

## 2020-01-13 ENCOUNTER — Other Ambulatory Visit: Payer: Self-pay | Admitting: Family Medicine

## 2020-02-13 ENCOUNTER — Other Ambulatory Visit: Payer: Self-pay | Admitting: Family Medicine

## 2020-02-17 NOTE — Progress Notes (Signed)
[facility] Clinic Note - Initial Visit   Date: 02/19/20  Tilley Faeth MRN: [id number] DOB: 23-Nov-1965   Dear Dr. Dennard Schaumann:  Thank you for your kind referral of Erica Walters for consultation of bilateral feet numbness. Although her history is well known to you, please allow [name] to reiterate it for the purpose of our medical record. The patient was accompanied to the clinic by self.   History of Present Illness: Erica Walters is a 54 y.o. female with hyperlipidemia presenting for evaluation of bilateral feet pain.  Starting in December 2019, she began having achy pain over the lateral foot which is worse with pressure on the right foot, such as tight shoes.  Sharp pain radiates over the top of her foot. In her left foot, she has sensation as if she is standing on something.  She has numbness at the tip of her little toe on the right.  NCS/EMG of the legs in February 2021 was normal. She works at [employer] and reports symptoms are worse after standing all day.   She also complaints of sharp pain in the low back and stiffness in the knees.  MRI lumbar spine shows disc bulge at L3-4, no nerve impingement.  She has been seeing a back specialists and completed physical therapy and back injections with no relief.   Out-side paper records, electronic medical record, and images have been reviewed where available and summarized as:  NCS/EMG of the legs 08/05/19:  This is a normal study of the lower extremities.  In particular, there is no evidence of a sensorimotor polyneuropathy or lumbosacral radiculopathy.    MRI lumbar spine 04/11/19: L3-4: Moderate disc bulge. Mild narrowing of both lateral recesses but without visible neural compression.  Mild, non-compressive disc bulges at the other lumbar levels. Mild facet osteoarthritis at L4-5 and L5-S1 that could contribute to low back pain or referred facet syndrome pain. No abnormality clearly explains the numbness in  the feet.   Lab Results  Component Value Date   HGBA1C 5.7 (H) 01/23/2019   Lab Results  Component Value Date   [id number] 408 09/08/2019   Lab Results  Component Value Date   TSH 1.46 01/23/2019   Lab Results  Component Value Date   ESRSEDRATE 14 12/25/2018    Past Medical History:  Diagnosis Date  . Allergy   . Arthritis   . Hyperlipidemia     Past Surgical History:  Procedure Laterality Date  . COLONOSCOPY  12/04/2017  . TOTAL ABDOMINAL HYSTERECTOMY W/ BILATERAL SALPINGOOPHORECTOMY       Medications:  Outpatient Encounter Medications as of 02/19/2020  Medication Sig  . Calcium Citrate-Vitamin D (CALCIUM CITRATE + D PO) Take by mouth.  . cyclobenzaprine (FLEXERIL) 5 MG tablet TK 1 TO 2 TS PO Q 8 TO 12 H PRN  . gabapentin (NEURONTIN) 300 MG capsule TAKE 1 CAPSULE(300 MG) BY MOUTH THREE TIMES DAILY  . nortriptyline (PAMELOR) 25 MG capsule TAKE 1 TO 2 CAPSULES BY MOUTH EVERY NIGHT AT BEDTIME  . [DISCONTINUED] nortriptyline (PAMELOR) 10 MG capsule Take 10-20 mg by mouth at bedtime. (Patient not taking: Reported on 02/19/2020)  . [DISCONTINUED] rosuvastatin (CRESTOR) 10 MG tablet TAKE 1 TABLET(10 MG) BY MOUTH DAILY (Patient not taking: Reported on 02/19/2020)   No facility-administered encounter medications on file as of 02/19/2020.    Allergies: No Known Allergies  Family History: Family History  Problem Relation Age of Onset  . Colon cancer Paternal Grandfather   . Esophageal cancer Neg  Hx   . Liver cancer Neg Hx   . Pancreatic cancer Neg Hx   . Rectal cancer Neg Hx   . Stomach cancer Neg Hx   . Colon polyps Neg Hx     Social History: Social History   Tobacco Use  . Smoking status: Current Every Day Smoker    Packs/day: 0.50    Years: 26.00    Pack years: 13.00    Types: Cigarettes  . Smokeless tobacco: Never Used  Vaping Use  . Vaping Use: Never used  Substance Use Topics  . Alcohol use: Yes    Alcohol/week: 14.0 standard drinks    Types: 14  Glasses of wine per week    Comment: daily wine per pt  . Drug use: Never   Social History   Social History Narrative  . Not on file    Vital Signs:  BP 128/89   Pulse 89   Ht 5' 6.5" (1.689 m)   Wt 184 lb (83.5 kg)   SpO2 98%   BMI 29.25 kg/m   Neurological Exam: MENTAL STATUS including orientation to time, place, person, recent and remote memory, attention span and concentration, language, and fund of knowledge is normal.  Speech is not dysarthric.  CRANIAL NERVES: II:  No visual field defects.   III-IV-VI: Pupils equal round and reactivet.  Normal conjugate, extra-ocular eye movements in all directions of gaze.  No nystagmus.  No ptosis.   VII:  Normal facial movements.   VIII:  Normal hearing and vestibular function.     XI:  Normal shoulder shrug and head rotation.    MOTOR: Motor strength is 5/5 throughout, including distally. No atrophy, fasciculations or abnormal movements.  No pronator drift.   MSRs:  Right        Left                  brachioradialis 2+  2+  biceps 2+  2+  triceps 2+  2+  patellar 2+  2+  ankle jerk 2+  2+  Hoffman no  no  plantar response down  down   SENSORY:  Normal and symmetric perception of light touch, pinprick, and vibration  COORDINATION/GAIT: Normal finger-to- nose-finger.  Intact rapid alternating movements bilaterally.  Gait narrow based and stable. Tandem and stressed gait intact.    IMPRESSION: Bilateral feet pain, suggestive of MSK pain "achy" with very little numbness/tingling.  I have personally viewed her NCS/EMG from February 2021 which is normal.  Further, her neurological exam is normal. As such, it is very unlikely that her pain is stemming from neurological etiology.  No additional neurological testing is indicated.  She will follow-up with her PCP to discuss her next steps.   Thank you for allowing me to participate in patient's care.  If I can answer any additional questions, I would be pleased to do so.     Sincerely,    Richele Strand K. Posey Pronto, DO

## 2020-02-19 ENCOUNTER — Other Ambulatory Visit: Payer: Self-pay

## 2020-02-19 ENCOUNTER — Ambulatory Visit (INDEPENDENT_AMBULATORY_CARE_PROVIDER_SITE_OTHER): Payer: 59 | Admitting: Neurology

## 2020-02-19 ENCOUNTER — Encounter: Payer: Self-pay | Admitting: Neurology

## 2020-02-19 VITALS — BP 128/89 | HR 89 | Ht 66.5 in | Wt 184.0 lb

## 2020-02-19 DIAGNOSIS — G8929 Other chronic pain: Secondary | ICD-10-CM | POA: Diagnosis not present

## 2020-02-19 DIAGNOSIS — M79671 Pain in right foot: Secondary | ICD-10-CM | POA: Diagnosis not present

## 2020-02-19 DIAGNOSIS — M79672 Pain in left foot: Secondary | ICD-10-CM

## 2020-03-07 ENCOUNTER — Other Ambulatory Visit: Payer: Self-pay | Admitting: Family Medicine

## 2020-04-05 ENCOUNTER — Other Ambulatory Visit: Payer: Self-pay | Admitting: Family Medicine

## 2020-04-05 DIAGNOSIS — M549 Dorsalgia, unspecified: Secondary | ICD-10-CM

## 2020-04-06 NOTE — Telephone Encounter (Signed)
Last OV - 09/08/19 Last refill -  03/08/20

## 2020-05-05 ENCOUNTER — Other Ambulatory Visit: Payer: Self-pay | Admitting: Family Medicine

## 2020-05-05 NOTE — Telephone Encounter (Signed)
Please advise 

## 2020-05-18 IMAGING — DX DG SHOULDER 2+V*R*
3 series · 3 of 3 positions shown · non-contrast
Comparison: None.

CLINICAL DATA: Chronic right shoulder pain

EXAM:
RIGHT SHOULDER - 2+ VIEW

[dg shoulder right (1 of 3)]
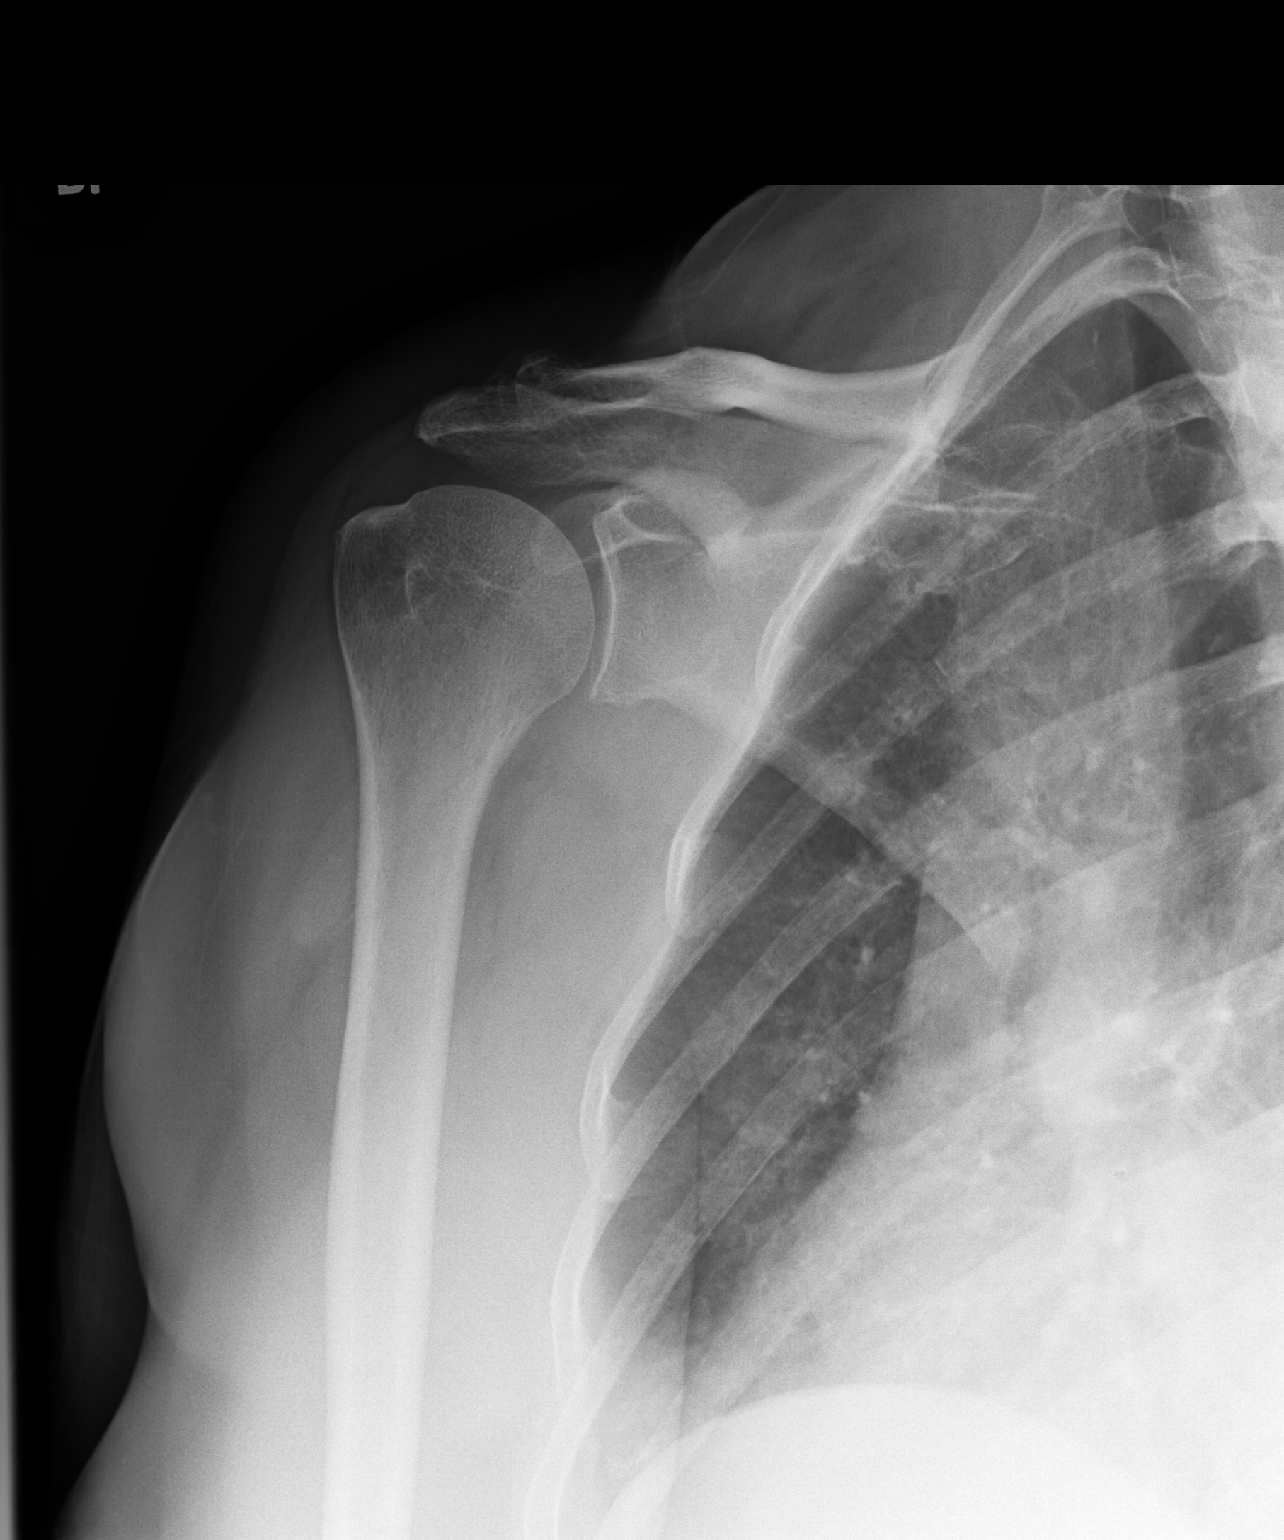

[dg shoulder right (2 of 3)]
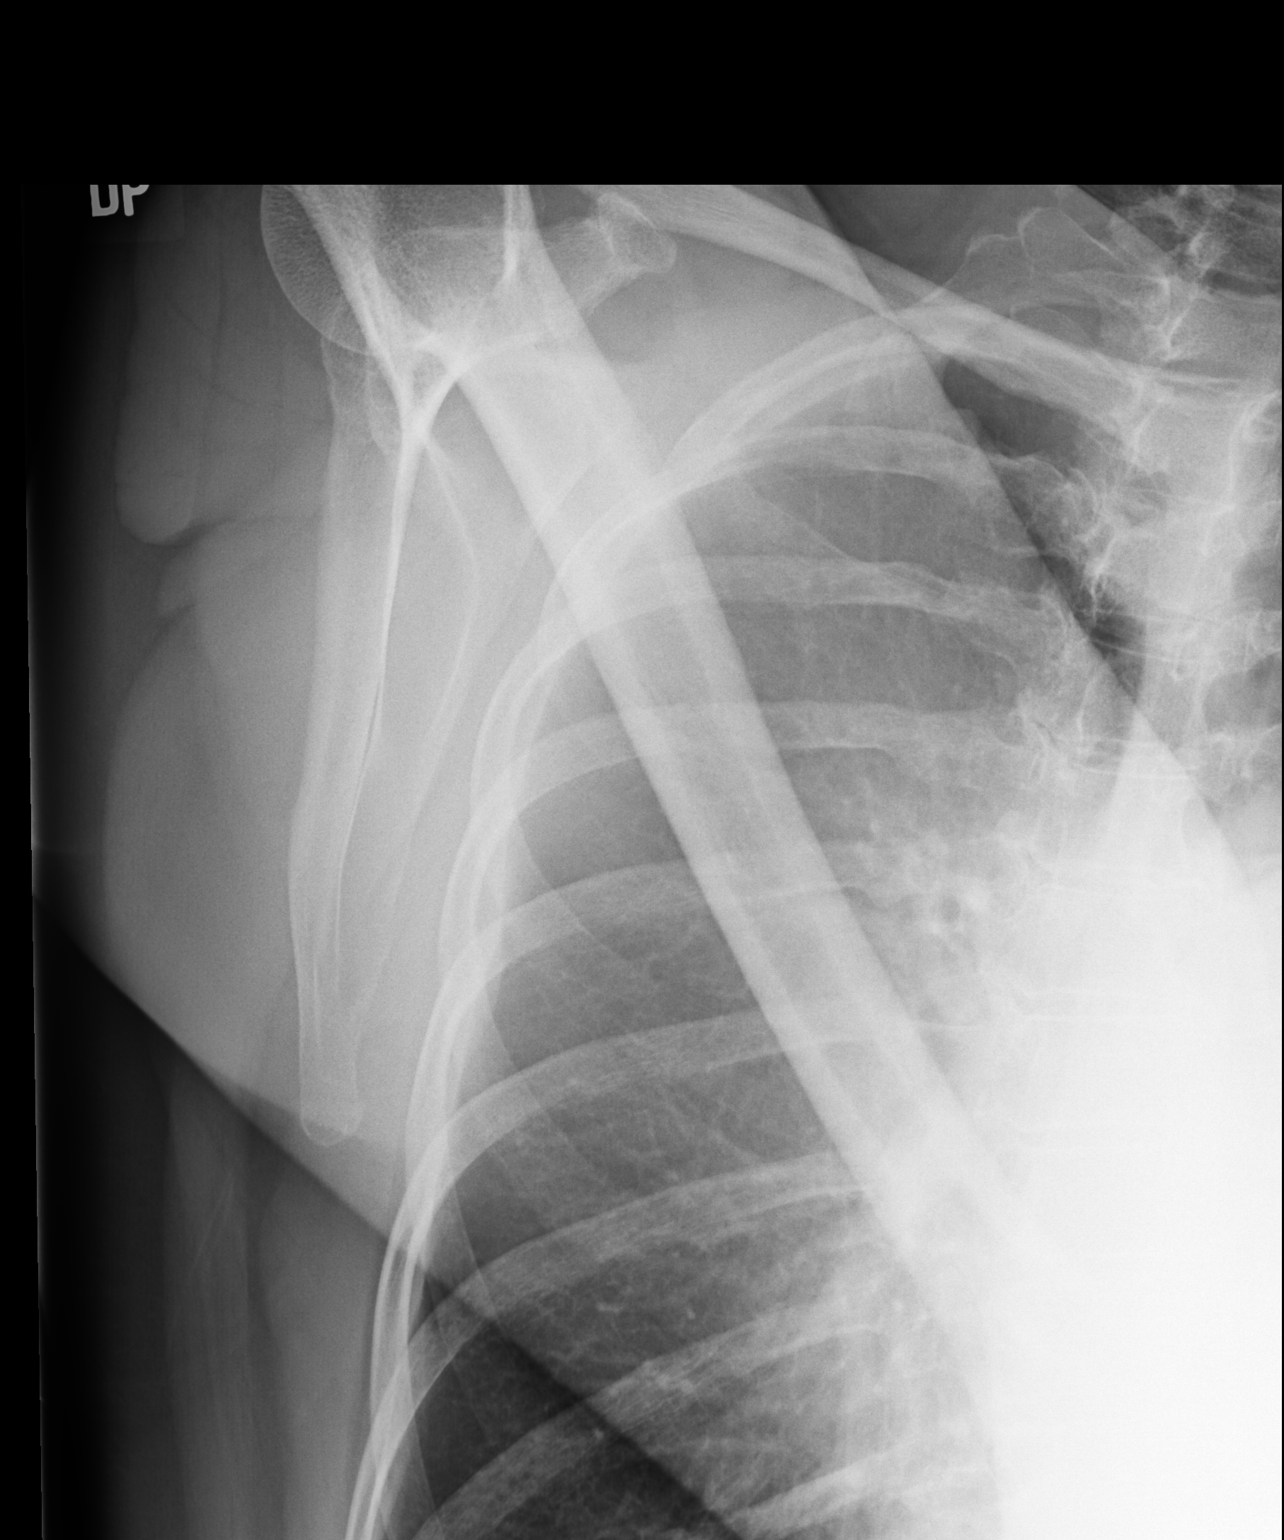

[dg shoulder right (3 of 3)]
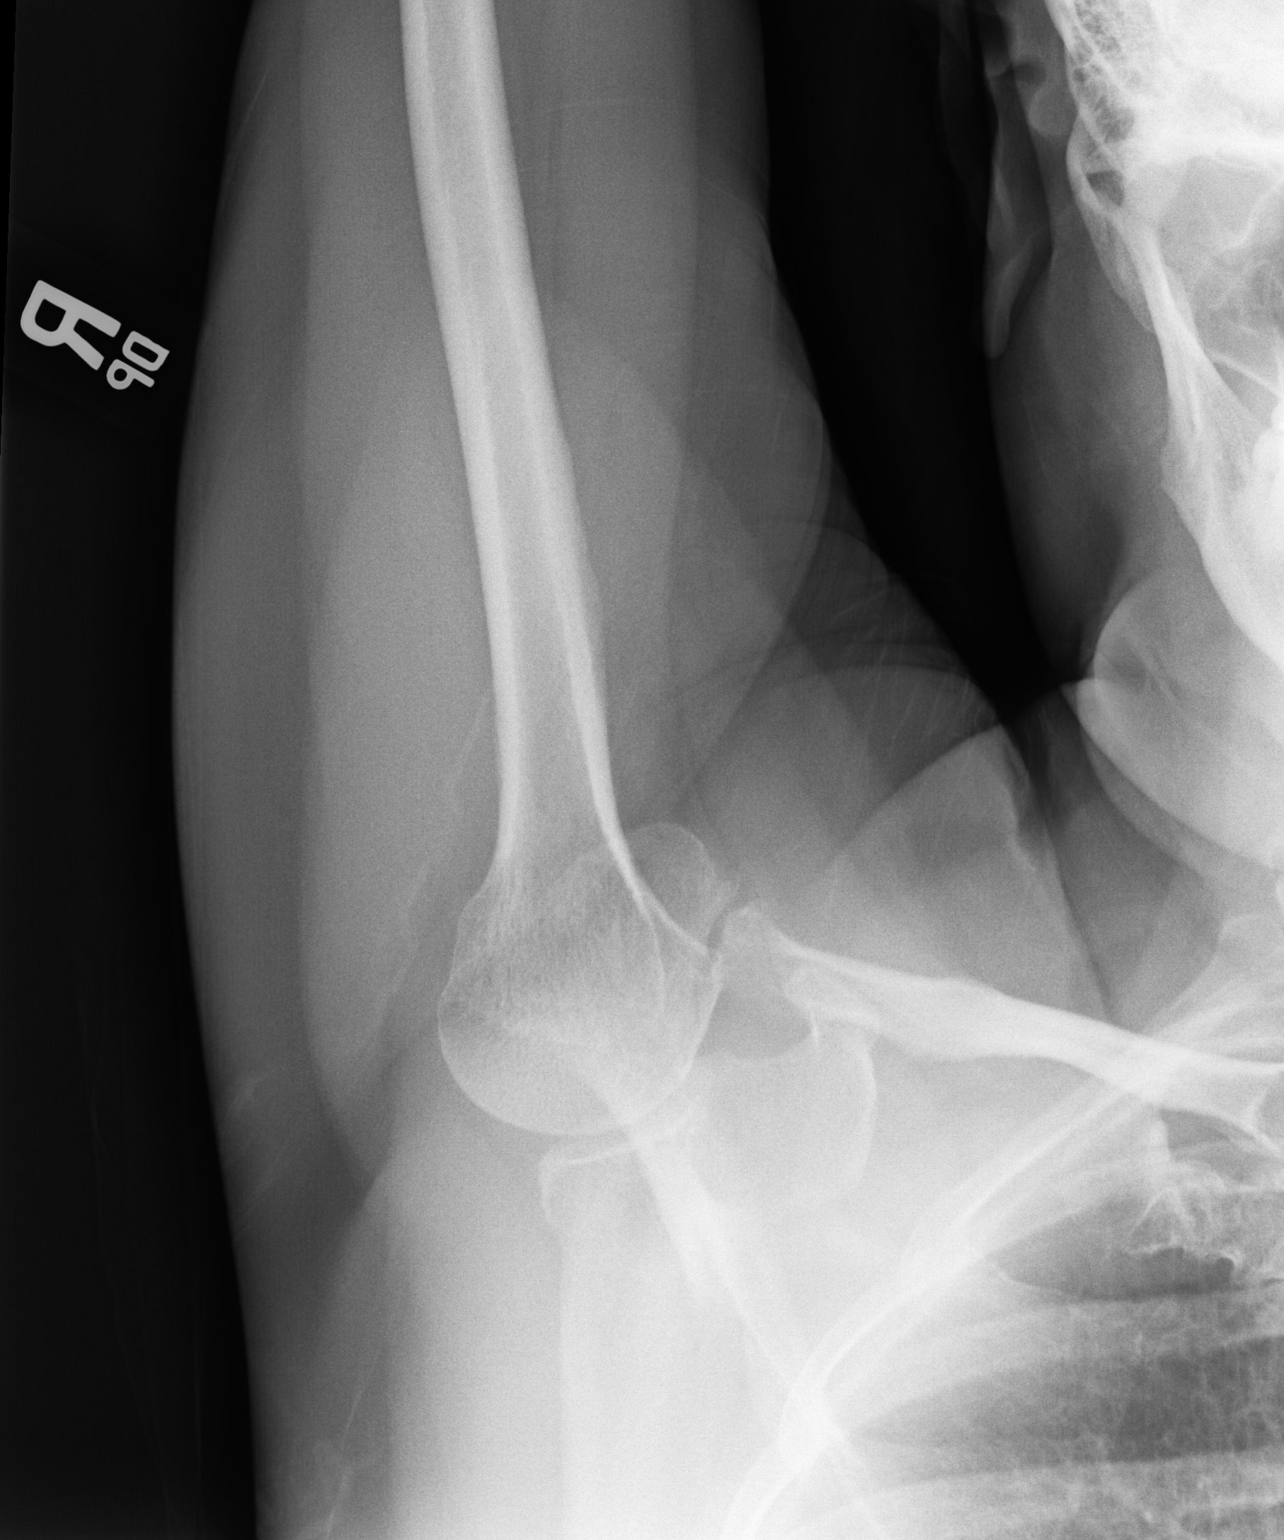

[3 of 3 positions shown; findings below may reference images not displayed]

FINDINGS: Degenerative changes of the acromioclavicular joint are noted. No
acute fracture or dislocation is seen. The underlying bony thorax is
within normal limits. No soft tissue abnormality is seen.
IMPRESSION: Degenerative change without acute abnormality.

## 2020-06-04 ENCOUNTER — Other Ambulatory Visit: Payer: Self-pay | Admitting: Family Medicine

## 2020-06-04 DIAGNOSIS — M549 Dorsalgia, unspecified: Secondary | ICD-10-CM

## 2020-07-04 ENCOUNTER — Other Ambulatory Visit: Payer: Self-pay | Admitting: Family Medicine

## 2020-07-04 DIAGNOSIS — M549 Dorsalgia, unspecified: Secondary | ICD-10-CM

## 2020-08-03 ENCOUNTER — Other Ambulatory Visit: Payer: Self-pay | Admitting: Family Medicine

## 2020-11-01 ENCOUNTER — Other Ambulatory Visit: Payer: Self-pay | Admitting: Family Medicine

## 2020-11-22 ENCOUNTER — Other Ambulatory Visit: Payer: Self-pay | Admitting: Family Medicine

## 2020-11-22 ENCOUNTER — Encounter: Payer: Self-pay | Admitting: Family Medicine

## 2020-11-22 DIAGNOSIS — G629 Polyneuropathy, unspecified: Secondary | ICD-10-CM

## 2020-12-01 ENCOUNTER — Other Ambulatory Visit: Payer: Self-pay | Admitting: Family Medicine

## 2020-12-01 DIAGNOSIS — M549 Dorsalgia, unspecified: Secondary | ICD-10-CM

## 2020-12-21 ENCOUNTER — Telehealth: Payer: Self-pay | Admitting: *Deleted

## 2020-12-21 NOTE — Telephone Encounter (Signed)
Received call from patient.   Reports that patient tested positive for COVID on 12/20/2020 with home test.   Call placed to patient to discuss.   Call placed to patient. No answer. VM full.

## 2020-12-22 NOTE — Telephone Encounter (Signed)
Call placed to patient.  Sx include HA, fever (T Max 102), nausea/ vomiting, congestion, and sinus pressure. Patient reports that the majority of Sx have resolved. States that she continues to have slight HA and congestion.   Advised to continue symptom management with OTC medications: Tylenol/ Ibuprofen for fever/ body aches, Mucinex/ Delsym for cough/ chest congestion, Afrin/Sudafed/nasal saline for sinus pressure/ nasal congestion  If chest pain, shortness of breath, fever >104 that is unresponsive to antipyretics noted, advised to go to ER for evaluation.   Patient has inquired about Paxlovid, but states that since she is feeling much better, she does not wish to proceed.   Advised that the CDC recommends the following criteria prior to ending isolation in unvaccinated persons:  at least 10 days since symptoms onset AND 3 days fever free without antipyretics (Tylenol or Ibuprofen) AND improvement in respiratory symptoms.

## 2021-01-19 ENCOUNTER — Encounter: Payer: Self-pay | Admitting: Student in an Organized Health Care Education/Training Program

## 2021-01-19 ENCOUNTER — Other Ambulatory Visit: Payer: Self-pay

## 2021-01-19 ENCOUNTER — Ambulatory Visit (HOSPITAL_BASED_OUTPATIENT_CLINIC_OR_DEPARTMENT_OTHER): Payer: 59 | Admitting: Student in an Organized Health Care Education/Training Program

## 2021-01-19 ENCOUNTER — Ambulatory Visit
Admission: RE | Admit: 2021-01-19 | Discharge: 2021-01-19 | Disposition: A | Discharge status: Home / Self Care | Payer: 59 | Source: Ambulatory Visit | Attending: Student in an Organized Health Care Education/Training Program | Admitting: Student in an Organized Health Care Education/Training Program

## 2021-01-19 VITALS — BP 141/96 | HR 96 | Temp 94.1°F | Resp 16 | Ht 66.0 in | Wt 202.0 lb

## 2021-01-19 DIAGNOSIS — M5136 Other intervertebral disc degeneration, lumbar region: Secondary | ICD-10-CM | POA: Insufficient documentation

## 2021-01-19 DIAGNOSIS — G894 Chronic pain syndrome: Secondary | ICD-10-CM

## 2021-01-19 DIAGNOSIS — M47816 Spondylosis without myelopathy or radiculopathy, lumbar region: Secondary | ICD-10-CM | POA: Insufficient documentation

## 2021-01-19 DIAGNOSIS — Z79899 Other long term (current) drug therapy: Secondary | ICD-10-CM | POA: Diagnosis not present

## 2021-01-19 DIAGNOSIS — G5793 Unspecified mononeuropathy of bilateral lower limbs: Secondary | ICD-10-CM

## 2021-01-19 DIAGNOSIS — M4722 Other spondylosis with radiculopathy, cervical region: Secondary | ICD-10-CM | POA: Insufficient documentation

## 2021-01-19 DIAGNOSIS — M549 Dorsalgia, unspecified: Secondary | ICD-10-CM

## 2021-01-19 DIAGNOSIS — M722 Plantar fascial fibromatosis: Secondary | ICD-10-CM

## 2021-01-19 MED ORDER — GABAPENTIN 300 MG PO CAPS: 600.0000 mg | ORAL_CAPSULE | Freq: Three times a day (TID) | ORAL | 2 refills | Status: DC

## 2021-01-19 MED ORDER — NORTRIPTYLINE HCL 25 MG PO CAPS: 25.0000 mg | ORAL_CAPSULE | Freq: Every day | ORAL | 1 refills | Status: DC
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
## 2021-01-19 NOTE — Progress Notes (Signed)
Safety precautions to be maintained throughout the outpatient stay will include: orient to surroundings, keep bed in low position, maintain call bell within reach at all times, provide assistance with transfer out of bed and ambulation.  

## 2021-02-22 ENCOUNTER — Telehealth: Payer: Self-pay | Admitting: Family Medicine

## 2021-02-22 NOTE — Telephone Encounter (Signed)
Returned patient's call to give referral number for neuropathy; referral made back in May. Info needed for insurance.

## 2021-03-01 ENCOUNTER — Telehealth: Payer: Self-pay

## 2021-03-01 NOTE — Telephone Encounter (Signed)
Erica Walters a representative from [facility] called inquiring about this pt's referral for Dr. Holley Raring. The representative stated that they have tried to reach out to referrals to schedule this appt for the pt. Rep has asked that some in referrals please reach out to [facility] to get this referral approved for scheduling, and also call pt when appt has been scheduled.  [facility] Referrals [phone]

## 2021-03-14 ENCOUNTER — Ambulatory Visit
Payer: 59 | Attending: Student in an Organized Health Care Education/Training Program | Admitting: Student in an Organized Health Care Education/Training Program

## 2021-03-14 ENCOUNTER — Other Ambulatory Visit: Payer: Self-pay

## 2021-03-14 ENCOUNTER — Encounter: Payer: Self-pay | Admitting: Student in an Organized Health Care Education/Training Program

## 2021-03-14 VITALS — BP 140/86 | HR 91 | Temp 97.1°F | Resp 15 | Ht 66.5 in | Wt 199.0 lb

## 2021-03-14 DIAGNOSIS — M722 Plantar fascial fibromatosis: Secondary | ICD-10-CM | POA: Insufficient documentation

## 2021-03-14 DIAGNOSIS — G894 Chronic pain syndrome: Secondary | ICD-10-CM | POA: Diagnosis present

## 2021-03-14 DIAGNOSIS — M47816 Spondylosis without myelopathy or radiculopathy, lumbar region: Secondary | ICD-10-CM | POA: Diagnosis not present

## 2021-03-14 DIAGNOSIS — G5793 Unspecified mononeuropathy of bilateral lower limbs: Secondary | ICD-10-CM | POA: Insufficient documentation

## 2021-03-14 DIAGNOSIS — M5136 Other intervertebral disc degeneration, lumbar region: Secondary | ICD-10-CM | POA: Diagnosis present

## 2021-03-14 DIAGNOSIS — M549 Dorsalgia, unspecified: Secondary | ICD-10-CM | POA: Diagnosis not present

## 2021-03-14 MED ORDER — GABAPENTIN 600 MG PO TABS: 900.0000 mg | ORAL_TABLET | Freq: Three times a day (TID) | ORAL | 2 refills | Status: DC

## 2021-03-14 NOTE — Progress Notes (Signed)
PROVIDER NOTE: Information contained herein reflects review and annotations entered in association with encounter. Interpretation of such information and data should be left to medically-trained personnel. Information provided to patient can be located elsewhere in the medical record under "Patient Instructions". Document created using STT-dictation technology, any transcriptional errors that may result from process are unintentional.    Patient: Erica Walters  Service Category: E/M  Provider: Gillis Santa, MD  DOB: 1965/12/23  DOS: 03/14/2021  Specialty: Interventional Pain Management  MRN: [id number]  Setting: Ambulatory outpatient  PCP: Susy Frizzle, MD  Type: Established Patient    Referring Provider: Susy Frizzle, MD  Location: Office  Delivery: Face-to-face     HPI  Ms. Erica Walters, a 55 y.o. year old female, is here today because of her Bilateral plantar fasciitis [M72.2]. Ms. Erica Walters's primary complain today is Foot Pain (bilat) Last encounter: My last encounter with her was on 01/19/2021. Pertinent problems: Ms. Erica Walters has Cervical radiculopathy; DDD (degenerative disc disease), cervical; and Bilateral plantar fasciitis on their pertinent problem list. Pain Assessment: Severity of Chronic pain is reported as a 5 /10. Location: Foot Right, Left/denies: pt believes bilateral knee and sharp lower back pain are independent but related to foot pain which pt states preceded onset of back pain. Onset: More than a month ago. Quality: Aching, Numbness, Sharp, Shooting. Timing: Constant. Modifying factor(s): meds take edge off. Vitals:  height is 5' 6.5" (1.689 m) and weight is 199 lb (90.3 kg). Her temporal temperature is 97.1 F (36.2 C) (abnormal). Her blood pressure is 140/86 and her pulse is 91. Her respiration is 15 and oxygen saturation is 99%.   Reason for encounter: medication management.   Patient presents today for medication management.  At her last clinic visits,  gabapentin was increased to 600 mg 3 times a day.  Patient is finding benefit with dose increase.  I recommend that she increase her dose of 900 mg 3 times a day.  I cautioned her on the risks and side effects at this dose.  Future considerations include Lyrica versus Cymbalta.  HPI and A&P from initial clinic visit: Presents today for bilateral feet pain onset December 2019, she began having achy pain over the lateral foot which is worse with pressure on the right foot, such as tight shoes.  Sharp pain radiates over the top of her foot. In her left foot, she has sensation as if she is standing on something.  She has numbness at the tip of her little toe on the right.  NCS/EMG of the legs in February 2021 was normal. States that she has bony spurs in bilateral feet. She has had injections in both of her feet with limited response.   Also endorses sharp pain in the low back and stiffness in the knees.  MRI lumbar spine shows disc bulge at L3-4, no nerve impingement.  She has been seeing a back specialists and completed physical therapy and back injections with no relief. She has had previous L-ESI without response. She works at [employer] and reports symptoms are worse after standing all day.    She is referred here by PCP    NCS/EMG of the legs 08/05/19:  This is a normal study of the lower extremities.  In particular, there is no evidence of a sensorimotor polyneuropathy or lumbosacral radiculopathy.     MRI lumbar spine 04/11/19: L3-4: Moderate disc bulge. Mild narrowing of both lateral recesses but without visible neural compression.   Mild, non-compressive disc bulges at  the other lumbar levels. Mild facet osteoarthritis at L4-5 and L5-S1 that could contribute to low back pain or referred facet syndrome pain. No abnormality clearly explains the numbness in the feet.  Chronic pain syndrome.  Unclear etiology.  Lumbar MRI is largely unremarkable and shows lumbar facet arthritis.  Discussed physical  therapy and stretching with patient.  She is on ibuprofen and acetaminophen combination.  Encouraged her to continue with that.  Future considerations could include diagnostic lumbar facet medial branch nerve blocks.  Nerve conduction velocity/EMG study unremarkable.  B12 levels within normal limits.  Lab work reviewed.  No organic cause for neuropathic pain of lower extremity.  Recommend she increase her gabapentin below as well as her nortriptyline.  Recommend EKG for future consideration of lidocaine infusion for peripheral neuropathic pain.   Do not recommend chronic opioid therapy for this condition.    ROS  Constitutional: Denies any fever or chills Gastrointestinal: No reported hemesis, hematochezia, vomiting, or acute GI distress Musculoskeletal: Denies any acute onset joint swelling, redness, loss of ROM, or weakness Neurological: No reported episodes of acute onset apraxia, aphasia, dysarthria, agnosia, amnesia, paralysis, loss of coordination, or loss of consciousness  Medication Review  Ibuprofen-Acetaminophen, cyclobenzaprine, gabapentin, and nortriptyline  History Review  Allergy: Ms. Erica Walters has No Known Allergies. Drug: Ms. Erica Walters  reports no history of drug use. Alcohol:  reports current alcohol use of about 14.0 standard drinks per week. Tobacco:  reports that she has been smoking cigarettes. She has a 13.00 pack-year smoking history. She has never used smokeless tobacco. Social: Ms. Erica Walters  reports that she has been smoking cigarettes. She has a 13.00 pack-year smoking history. She has never used smokeless tobacco. She reports current alcohol use of about 14.0 standard drinks per week. She reports that she does not use drugs. Medical:  has a past medical history of Allergy, Arthritis, and Hyperlipidemia. Surgical: Ms. Erica Walters  has a past surgical history that includes Total abdominal hysterectomy w/ bilateral salpingoophorectomy and Colonoscopy (12/04/2017). Family:  family history includes Colon cancer in her paternal grandfather.  Laboratory Chemistry Profile   Renal Lab Results  Component Value Date   BUN 11 01/23/2019   CREATININE 0.81 73/71/0626   BCR NOT APPLICABLE 94/85/4627   GFRAA 97 01/23/2019   GFRNONAA 84 01/23/2019    Hepatic Lab Results  Component Value Date   AST 44 (H) 01/23/2019   ALT 62 (H) 01/23/2019   ALBUMIN 4.1 07/19/2015   ALKPHOS 83 07/19/2015   LIPASE 30 07/19/2015    Electrolytes Lab Results  Component Value Date   NA 141 01/23/2019   K 4.8 01/23/2019   CL 106 01/23/2019   CALCIUM 9.7 01/23/2019    Bone Lab Results  Component Value Date   VD25OH 31 01/23/2019    Inflammation (CRP: Acute Phase) (ESR: Chronic Phase) Lab Results  Component Value Date   CRP 4.7 12/25/2018   ESRSEDRATE 14 12/25/2018         Note: Above Lab results reviewed.  Recent Imaging Review  NCV with EMG(electromyography) Alda Berthold, DO     08/05/2019  1:54 PM [facility]  [place], [address]  [place], [place] [address] Tel: [phone] Fax:  [phone] Test Date:  08/05/2019  Patient: Erica Walters DOB: [birth year] Physician: Narda Amber,  DO  Sex: Female Height: '5\' 0"'  Ref Phys: Zonia Kief, MD  ID#: [id number] Temp: 32.0C Technician:    Patient Complaints: This is a 55 year old female referred for evaluation of  chronic  low back pain and bilateral feet pain and paresthesias.  NCV & EMG Findings: Electrodiagnostic testing of the right lower extremity and  additional studies of the left shows: 1. Bilateral sural and superficial peroneal sensory responses are  within normal limits. 2. Bilateral peroneal and tibial motor responses are within  normal limits. 3. Bilateral tibial H reflex studies are within normal limits. 4. There is no evidence of active or chronic motor axonal changes  affecting any of the tested muscles.  Motor unit configuration  and recruitment pattern is within normal  limits.  Impression: This is a normal study of the lower extremities.  In particular,  there is no evidence of a sensorimotor polyneuropathy or  lumbosacral radiculopathy.    ___________________________ Narda Amber, DO  Nerve Conduction Studies Anti Sensory Summary Table   Site NR Peak (ms) Norm Peak (ms) P-T Amp (V) Norm P-T Amp  Left Sup Peroneal Anti Sensory (Ant Lat Mall)  32C  12 cm    2.3 <4.6 20.3 >4  Right Sup Peroneal Anti Sensory (Ant Lat Mall)  32C  12 cm    2.3 <4.6 21.3 >4  Left Sural Anti Sensory (Lat Mall)  32C  Calf    3.5 <4.6 22.0 >4  Right Sural Anti Sensory (Lat Mall)  32C  Calf    2.5 <4.6 21.0 >4   Motor Summary Table   Site NR Onset (ms) Norm Onset (ms) O-P Amp (mV) Norm O-P Amp  Site1 Site2 Delta-0 (ms) Dist (cm) Vel (m/s) Norm Vel (m/s)  Left Peroneal Motor (Ext Dig Brev)  32C  Ankle    4.0 <6.0 4.7 >2.5 B Fib Ankle 7.8 34.0 44 >40  B Fib    11.8  4.3  Poplt B Fib 1.5 8.0 53 >40  Poplt    13.3  4.2         Right Peroneal Motor (Ext Dig Brev)  32C  Ankle    3.2 <6.0 6.5 >2.5 B Fib Ankle 7.5 33.0 44 >40  B Fib    10.7  5.7  Poplt B Fib 1.6 8.0 50 >40  Poplt    12.3  5.3         Left Tibial Motor (Abd Hall Brev)  32C  Ankle    5.5 <6.0 10.2 >4 Knee Ankle 7.9 38.0 48 >40  Knee    13.4  7.0         Right Tibial Motor (Abd Hall Brev)  32C  Ankle    5.5 <6.0 8.5 >4 Knee Ankle 9.9 42.0 42 >40  Knee    15.4  5.0          H Reflex Studies   NR H-Lat (ms) Lat Norm (ms) L-R H-Lat (ms)  Left Tibial (Gastroc)  32C     30.75 <35 0.95  Right Tibial (Gastroc)  32C     31.70 <35 0.95   EMG   Side Muscle Ins Act Fibs Psw Fasc Number Recrt Dur Dur. Amp Amp.  Poly Poly. Comment  Right AntTibialis Nml Nml Nml Nml Nml Nml Nml Nml Nml Nml Nml Nml  N/A  Right Gastroc Nml Nml Nml Nml Nml Nml Nml Nml Nml Nml Nml Nml N/A   Right Flex Dig Long Nml Nml Nml Nml Nml Nml Nml Nml Nml Nml Nml  Nml N/A  Right RectFemoris Nml Nml Nml Nml Nml Nml Nml Nml Nml  Nml Nml Nml  N/A  Right GluteusMed Nml Nml Nml Nml Nml  Nml Nml Nml Nml Nml Nml Nml  N/A  Right Lumbo Parasp Low Nml Nml Nml Nml NE - - - - - - - N/A  Left AntTibialis Nml Nml Nml Nml Nml Nml Nml Nml Nml Nml Nml Nml  N/A  Left Gastroc Nml Nml Nml Nml Nml Nml Nml Nml Nml Nml Nml Nml N/A  Left Flex Dig Long Nml Nml Nml Nml Nml Nml Nml Nml Nml Nml Nml  Nml N/A  Left RectFemoris Nml Nml Nml Nml Nml Nml Nml Nml Nml Nml Nml Nml  N/A  Left GluteusMed Nml Nml Nml Nml Nml Nml Nml Nml Nml Nml Nml Nml  N/A   Waveforms:                  Note: Reviewed        Physical Exam  General appearance: Well nourished, well developed, and well hydrated. In no apparent acute distress Mental status: Alert, oriented x 3 (person, place, & time)       Respiratory: No evidence of acute respiratory distress Eyes: PERLA Vitals: BP 140/86   Pulse 91   Temp (!) 97.1 F (36.2 C) (Temporal)   Resp 15   Ht 5' 6.5" (1.689 m)   Wt 199 lb (90.3 kg)   SpO2 99%   BMI 31.64 kg/m  BMI: Estimated body mass index is 31.64 kg/m as calculated from the following:   Height as of this encounter: 5' 6.5" (1.689 m).   Weight as of this encounter: 199 lb (90.3 kg). Ideal: Ideal body weight: 60.5 kg (133 lb 4.3 oz) Adjusted ideal body weight: 72.4 kg (159 lb 9 oz)  Assessment   Status Diagnosis  Controlled Controlled Controlled 1. Bilateral plantar fasciitis   2. Back pain, unspecified back location, unspecified back pain laterality, unspecified chronicity   3. Neuropathy involving both lower extremities   4. Lumbar facet arthropathy   5. Lumbar degenerative disc disease   6. Chronic pain syndrome      Updated Problems: Problem  Bilateral Plantar Fasciitis  Cervical Radiculopathy  Ddd (Degenerative Disc Disease), Cervical    Plan of Care    Ms. Erica Walters has a current medication list which includes the following long-term medication(s): gabapentin and nortriptyline.  Pharmacotherapy  (Medications Ordered): Meds ordered this encounter  Medications   gabapentin (NEURONTIN) 600 MG tablet    Sig: Take 1.5 tablets (900 mg total) by mouth 3 (three) times daily.    Dispense:  90 tablet    Refill:  2    Fill one day early if pharmacy is closed on scheduled refill date. May substitute for generic if available.   Future considerations include Lyrica, Cymbalta, lidocaine infusion.  EKG within normal limits.  Orders:  No orders of the defined types were placed in this encounter.  Follow-up plan:   Return in about 3 months (around 06/13/2021) for Medication Management, in person.      Recent Visits Date Type Provider Dept  01/19/21 Office Visit Gillis Santa, MD [facility]  Showing recent visits within past 90 days and meeting all other requirements Today's Visits Date Type Provider Dept  03/14/21 Office Visit Gillis Santa, MD [facility]  Showing today's visits and meeting all other requirements Future Appointments Date Type Provider Dept  06/06/21 Appointment Gillis Santa, MD [facility]  Showing future appointments within next 90 days and meeting all other requirements I discussed the assessment and treatment plan with the patient. The patient was provided an  opportunity to ask questions and all were answered. The patient agreed with the plan and demonstrated an understanding of the instructions.  Patient advised to call back or seek an in-person evaluation if the symptoms or condition worsens.  Duration of encounter: 41mnutes.  Note by: BGillis Santa MD Date: 03/14/2021; Time: 10:32 AM

## 2021-03-31 ENCOUNTER — Other Ambulatory Visit: Payer: Self-pay | Admitting: Family Medicine

## 2021-03-31 DIAGNOSIS — G5793 Unspecified mononeuropathy of bilateral lower limbs: Secondary | ICD-10-CM

## 2021-03-31 DIAGNOSIS — M549 Dorsalgia, unspecified: Secondary | ICD-10-CM

## 2021-03-31 DIAGNOSIS — G894 Chronic pain syndrome: Secondary | ICD-10-CM

## 2021-05-23 ENCOUNTER — Encounter: Payer: Self-pay | Admitting: Family Medicine

## 2021-05-24 NOTE — Telephone Encounter (Signed)
Please advise on pt's request for referral to [facility]. Thanks!

## 2021-05-25 ENCOUNTER — Other Ambulatory Visit: Payer: Self-pay | Admitting: Family Medicine

## 2021-05-25 DIAGNOSIS — M722 Plantar fascial fibromatosis: Secondary | ICD-10-CM

## 2021-06-06 ENCOUNTER — Ambulatory Visit
Payer: 59 | Attending: Student in an Organized Health Care Education/Training Program | Admitting: Student in an Organized Health Care Education/Training Program

## 2021-06-06 ENCOUNTER — Other Ambulatory Visit: Payer: Self-pay

## 2021-06-06 ENCOUNTER — Encounter: Payer: Self-pay | Admitting: Student in an Organized Health Care Education/Training Program

## 2021-06-06 VITALS — BP 137/92 | HR 87 | Temp 98.1°F | Resp 16 | Ht 67.0 in | Wt 199.0 lb

## 2021-06-06 DIAGNOSIS — M722 Plantar fascial fibromatosis: Secondary | ICD-10-CM | POA: Insufficient documentation

## 2021-06-06 DIAGNOSIS — M47816 Spondylosis without myelopathy or radiculopathy, lumbar region: Secondary | ICD-10-CM | POA: Diagnosis present

## 2021-06-06 DIAGNOSIS — G894 Chronic pain syndrome: Secondary | ICD-10-CM | POA: Insufficient documentation

## 2021-06-06 DIAGNOSIS — M549 Dorsalgia, unspecified: Secondary | ICD-10-CM | POA: Insufficient documentation

## 2021-06-06 DIAGNOSIS — G5793 Unspecified mononeuropathy of bilateral lower limbs: Secondary | ICD-10-CM | POA: Diagnosis not present

## 2021-06-06 MED ORDER — NORTRIPTYLINE HCL 25 MG PO CAPS
25.0000 mg | ORAL_CAPSULE | Freq: Every day | ORAL | 5 refills | Status: DC
Start: 2021-06-06 — End: 2021-07-13

## 2021-06-06 MED ORDER — PREGABALIN 25 MG PO CAPS: ORAL_CAPSULE | ORAL | 0 refills | Status: DC

## 2021-06-06 NOTE — Progress Notes (Signed)
Safety precautions to be maintained throughout the outpatient stay will include: orient to surroundings, keep bed in low position, maintain call bell within reach at all times, provide assistance with transfer out of bed and ambulation.  

## 2021-06-06 NOTE — Progress Notes (Signed)
PROVIDER NOTE: Information contained herein reflects review and annotations entered in association with encounter. Interpretation of such information and data should be left to medically-trained personnel. Information provided to patient can be located elsewhere in the medical record under "Patient Instructions". Document created using STT-dictation technology, any transcriptional errors that may result from process are unintentional.    Patient: Erica Walters  Service Category: E/M  Provider: Gillis Santa, MD  DOB: May 12, 1966  DOS: 06/06/2021  Specialty: Interventional Pain Management  MRN: [id number]  Setting: Ambulatory outpatient  PCP: Susy Frizzle, MD  Type: Established Patient    Referring Provider: Susy Frizzle, MD  Location: Office  Delivery: Face-to-face     HPI  Erica Walters, a 55 y.o. year old female, is here today because of her Bilateral plantar fasciitis [M72.2]. Erica Walters primary complain today is Back Pain (lower) Last encounter: My last encounter with her was on 03/14/21 Pertinent problems: Erica Walters has Cervical radiculopathy; DDD (degenerative disc disease), cervical; and Bilateral plantar fasciitis on their pertinent problem list. Pain Assessment: Severity of Chronic pain is reported as a 4 /10. Location: Back Right, Left/pain radiaities down to her hip and feet. Onset: More than a month ago. Quality: Aching, Constant, Throbbing, Sharp. Timing: Constant. Modifying factor(s): Meds and getting off my feet. Vitals:  height is _0  (1.702 m) and weight is 199 lb (90.3 kg). Her temperature is 98.1 F (36.7 C). Her blood pressure is 137/92 (abnormal) and her pulse is 87. Her respiration is 16 and oxygen saturation is 99%.   Reason for encounter: medication management.  No benefit with increase gabapentin to 900 mg 3 times a day.  She is noticing issues with memory and states that she is more forgetful.  Recommend that she discontinue gabapentin and start Lyrica  as below.   03/14/21: Patient presents today for medication management.  At her last clinic visits, gabapentin was increased to 600 mg 3 times a day.  Patient is finding benefit with dose increase.  I recommend that she increase her dose of 900 mg 3 times a day.  I cautioned her on the risks and side effects at this dose.  Future considerations include Lyrica versus Cymbalta.  HPI and A&P from initial clinic visit: Presents today for bilateral feet pain onset December 2019, she began having achy pain over the lateral foot which is worse with pressure on the right foot, such as tight shoes.  Sharp pain radiates over the top of her foot. In her left foot, she has sensation as if she is standing on something.  She has numbness at the tip of her little toe on the right.  NCS/EMG of the legs in February 2021 was normal. States that she has bony spurs in bilateral feet. She has had injections in both of her feet with limited response.   Also endorses sharp pain in the low back and stiffness in the knees.  MRI lumbar spine shows disc bulge at L3-4, no nerve impingement.  She has been seeing a back specialists and completed physical therapy and back injections with no relief. She has had previous L-ESI without response. She works at [employer] and reports symptoms are worse after standing all day.    She is referred here by PCP    NCS/EMG of the legs 08/05/19:  This is a normal study of the lower extremities.  In particular, there is no evidence of a sensorimotor polyneuropathy or lumbosacral radiculopathy.     MRI lumbar spine  04/11/19: L3-4: Moderate disc bulge. Mild narrowing of both lateral recesses but without visible neural compression.   Mild, non-compressive disc bulges at the other lumbar levels. Mild facet osteoarthritis at L4-5 and L5-S1 that could contribute to low back pain or referred facet syndrome pain. No abnormality clearly explains the numbness in the feet.  Chronic pain syndrome.   Unclear etiology.  Lumbar MRI is largely unremarkable and shows lumbar facet arthritis.  Discussed physical therapy and stretching with patient.  She is on ibuprofen and acetaminophen combination.  Encouraged her to continue with that.  Future considerations could include diagnostic lumbar facet medial branch nerve blocks.  Nerve conduction velocity/EMG study unremarkable.  B12 levels within normal limits.  Lab work reviewed.  No organic cause for neuropathic pain of lower extremity.  Recommend she increase her gabapentin below as well as her nortriptyline.  Recommend EKG for future consideration of lidocaine infusion for peripheral neuropathic pain.   Do not recommend chronic opioid therapy for this condition.    ROS  Constitutional: Denies any fever or chills Gastrointestinal: No reported hemesis, hematochezia, vomiting, or acute GI distress Musculoskeletal: Denies any acute onset joint swelling, redness, loss of ROM, or weakness Neurological: No reported episodes of acute onset apraxia, aphasia, dysarthria, agnosia, amnesia, paralysis, loss of coordination, or loss of consciousness  Medication Review  Ibuprofen-Acetaminophen, cyclobenzaprine, nortriptyline, and pregabalin  History Review  Allergy: Erica Walters has No Known Allergies. Drug: Erica Walters  reports no history of drug use. Alcohol:  reports current alcohol use of about 14.0 standard drinks per week. Tobacco:  reports that she has been smoking cigarettes. She has a 13.00 pack-year smoking history. She has never used smokeless tobacco. Social: Erica Walters  reports that she has been smoking cigarettes. She has a 13.00 pack-year smoking history. She has never used smokeless tobacco. She reports current alcohol use of about 14.0 standard drinks per week. She reports that she does not use drugs. Medical:  has a past medical history of Allergy, Arthritis, and Hyperlipidemia. Surgical: Erica Walters  has a past surgical history that  includes Total abdominal hysterectomy w/ bilateral salpingoophorectomy and Colonoscopy (12/04/2017). Family: family history includes Colon cancer in her paternal grandfather.  Laboratory Chemistry Profile   Renal Lab Results  Component Value Date   BUN 11 01/23/2019   CREATININE 0.81 53/66/4403   BCR NOT APPLICABLE 47/42/5956   GFRAA 97 01/23/2019   GFRNONAA 84 01/23/2019    Hepatic Lab Results  Component Value Date   AST 44 (H) 01/23/2019   ALT 62 (H) 01/23/2019   ALBUMIN 4.1 07/19/2015   ALKPHOS 83 07/19/2015   LIPASE 30 07/19/2015    Electrolytes Lab Results  Component Value Date   NA 141 01/23/2019   K 4.8 01/23/2019   CL 106 01/23/2019   CALCIUM 9.7 01/23/2019    Bone Lab Results  Component Value Date   VD25OH 31 01/23/2019    Inflammation (CRP: Acute Phase) (ESR: Chronic Phase) Lab Results  Component Value Date   CRP 4.7 12/25/2018   ESRSEDRATE 14 12/25/2018         Note: Above Lab results reviewed.  Recent Imaging Review  NCV with EMG(electromyography) Alda Berthold, DO     08/05/2019  1:54 PM [facility]  [place], [place]  [place], [place] [address] Tel: [phone] Fax:  [phone] Test Date:  08/05/2019  Patient: Erica Walters DOB: 1966/02/10 Physician: Narda Amber,  DO  Sex: Female Height: _0  Ref Phys:  Zonia Kief, MD  ID#: [id number] Temp: 32.0C Technician:    Patient Complaints: This is a 55 year old female referred for evaluation of chronic  low back pain and bilateral feet pain and paresthesias.  NCV & EMG Findings: Electrodiagnostic testing of the right lower extremity and  additional studies of the left shows: 1. Bilateral sural and superficial peroneal sensory responses are  within normal limits. 2. Bilateral peroneal and tibial motor responses are within  normal limits. 3. Bilateral tibial H reflex studies are within normal limits. 4. There is no evidence of active or chronic motor axonal changes   affecting any of the tested muscles.  Motor unit configuration  and recruitment pattern is within normal limits.  Impression: This is a normal study of the lower extremities.  In particular,  there is no evidence of a sensorimotor polyneuropathy or  lumbosacral radiculopathy.    ___________________________ Narda Amber, DO  Nerve Conduction Studies Anti Sensory Summary Table   Site NR Peak (ms) Norm Peak (ms) P-T Amp (V) Norm P-T Amp  Left Sup Peroneal Anti Sensory (Ant Lat Mall)  32C  12 cm    2.3 <4.6 20.3 >4  Right Sup Peroneal Anti Sensory (Ant Lat Mall)  32C  12 cm    2.3 <4.6 21.3 >4  Left Sural Anti Sensory (Lat Mall)  32C  Calf    3.5 <4.6 22.0 >4  Right Sural Anti Sensory (Lat Mall)  32C  Calf    2.5 <4.6 21.0 >4   Motor Summary Table   Site NR Onset (ms) Norm Onset (ms) O-P Amp (mV) Norm O-P Amp  Site1 Site2 Delta-0 (ms) Dist (cm) Vel (m/s) Norm Vel (m/s)  Left Peroneal Motor (Ext Dig Brev)  32C  Ankle    4.0 <6.0 4.7 >2.5 B Fib Ankle 7.8 34.0 44 >40  B Fib    11.8  4.3  Poplt B Fib 1.5 8.0 53 >40  Poplt    13.3  4.2         Right Peroneal Motor (Ext Dig Brev)  32C  Ankle    3.2 <6.0 6.5 >2.5 B Fib Ankle 7.5 33.0 44 >40  B Fib    10.7  5.7  Poplt B Fib 1.6 8.0 50 >40  Poplt    12.3  5.3         Left Tibial Motor (Abd Hall Brev)  32C  Ankle    5.5 <6.0 10.2 >4 Knee Ankle 7.9 38.0 48 >40  Knee    13.4  7.0         Right Tibial Motor (Abd Hall Brev)  32C  Ankle    5.5 <6.0 8.5 >4 Knee Ankle 9.9 42.0 42 >40  Knee    15.4  5.0          H Reflex Studies   NR H-Lat (ms) Lat Norm (ms) L-R H-Lat (ms)  Left Tibial (Gastroc)  32C     30.75 <35 0.95  Right Tibial (Gastroc)  32C     31.70 <35 0.95   EMG   Side Muscle Ins Act Fibs Psw Fasc Number Recrt Dur Dur. Amp Amp.  Poly Poly. Comment  Right AntTibialis _0  _1  Nml Nml  N/A  Right Gastroc _2  _3  Nml Nml N/A   Right Flex Dig Long  _4  _5  Nml  Nml N/A  Right  RectFemoris _0  _1  Nml Nml  N/A  Right GluteusMed _2  _3  Nml Nml  N/A  Right Lumbo Parasp Low Nml Nml Nml Nml NE - - - - - - - N/A  Left AntTibialis _4  _5  Nml Nml  N/A  Left Gastroc _6  _7  Nml Nml N/A  Left Flex Dig Long _8  _9  Nml  Nml N/A  Left RectFemoris _10  _11  Nml Nml  N/A  Left GluteusMed _12  _13  Nml Nml  N/A   Waveforms:                  Note: Reviewed        Physical Exam  General appearance: Well nourished, well developed, and well hydrated. In no apparent acute distress Mental status: Alert, oriented x 3 (person, place, & time)       Respiratory: No evidence of acute respiratory distress Eyes: PERLA Vitals: BP (!) 137/92    Pulse 87    Temp 98.1 F (36.7 C)    Resp 16    Ht _14  (1.702 m)    Wt 199 lb (90.3 kg)    SpO2 99%    BMI 31.17 kg/m  BMI: Estimated body mass index is 31.17 kg/m as calculated from the following:   Height as of this encounter: _15  (1.702 m).   Weight as of this encounter: 199 lb (90.3 kg). Ideal: Ideal body weight: 61.6 kg (135 lb 12.9 oz) Adjusted ideal body weight: 73.1 kg (161 lb 1.3 oz)  Assessment   Status Diagnosis  Controlled Controlled Controlled 1. Bilateral plantar fasciitis   2. Back pain, unspecified back location, unspecified back pain laterality, unspecified chronicity   3. Neuropathy involving both lower extremities   4. Lumbar facet arthropathy   5. Chronic pain syndrome       Plan of Care    Erica Walters has a current medication list which includes the following long-term medication(s): pregabalin and nortriptyline.  Pharmacotherapy (Medications Ordered): Meds ordered this encounter  Medications    nortriptyline (PAMELOR) 25 MG capsule    Sig: Take 1 capsule (25 mg total) by mouth at bedtime.    Dispense:  30 capsule    Refill:  5    Requires office visit before any further refills can be given.   pregabalin (LYRICA) 25 MG capsule    Sig: Take 1 capsule (25 mg total) by mouth 3 (three) times daily for 15 days, THEN 2 capsules (50 mg total) 3 (three) times daily.    Dispense:  315 capsule    Refill:  0   Future considerations include Cymbalta, lidocaine infusion.  EKG within normal limits.  Orders:  No orders of the defined types were placed in this encounter.  Follow-up plan:   Return in about 8 weeks (around 08/01/2021) for Medication Management, in person.      Recent Visits Date Type Provider Dept  03/14/21 Office Visit Gillis Santa, MD [facility]  Showing recent visits within past 90 days and meeting all other requirements Today's Visits Date Type Provider Dept  06/06/21 Office Visit Gillis Santa, MD [facility]  Showing today's visits and meeting all other requirements Future  Appointments Date Type Provider Dept  08/01/21 Appointment Gillis Santa, MD [facility]  Showing future appointments within next 90 days and meeting all other requirements I discussed the assessment and treatment plan with the patient. The patient was provided an opportunity to ask questions and all were answered. The patient agreed with the plan and demonstrated an understanding of the instructions.  Patient advised to call back or seek an in-person evaluation if the symptoms or condition worsens.  Duration of encounter: 49mnutes.  Note by: BGillis Santa MD Date: 06/06/2021; Time: 11:44 AM

## 2021-06-09 ENCOUNTER — Other Ambulatory Visit: Payer: Self-pay

## 2021-06-09 ENCOUNTER — Other Ambulatory Visit: Payer: Self-pay | Admitting: Podiatry

## 2021-06-09 ENCOUNTER — Ambulatory Visit (INDEPENDENT_AMBULATORY_CARE_PROVIDER_SITE_OTHER): Payer: 59 | Admitting: Podiatry

## 2021-06-09 ENCOUNTER — Ambulatory Visit (INDEPENDENT_AMBULATORY_CARE_PROVIDER_SITE_OTHER): Payer: 59

## 2021-06-09 DIAGNOSIS — M722 Plantar fascial fibromatosis: Secondary | ICD-10-CM

## 2021-06-09 DIAGNOSIS — M7731 Calcaneal spur, right foot: Secondary | ICD-10-CM

## 2021-06-09 DIAGNOSIS — M7732 Calcaneal spur, left foot: Secondary | ICD-10-CM

## 2021-06-09 DIAGNOSIS — M79671 Pain in right foot: Secondary | ICD-10-CM

## 2021-06-14 ENCOUNTER — Encounter: Payer: Self-pay | Admitting: Podiatry

## 2021-06-14 NOTE — Progress Notes (Signed)
Subjective:  Patient ID: Erica Walters, female    DOB: 04/28/1966,  MRN: [id number]  Chief Complaint  Patient presents with   Foot Pain    Bilateral foot pain wants a second opinion     55 y.o. female presents with the above complaint.  Patient presents with complaint bilateral heel pain that has been going for quite some time.  Pain on palpation to the heel.  Is been progressively getting worse from past 3 years.  She states that she was told Planter fasciitis that time.  She is tried everything she could but has progressed to gotten worse.  She would like to discuss treatment options he has not seen anyone else prior to seeing me.  She denies any other acute complaints.   Review of Systems: Negative except as noted in the HPI. Denies N/V/F/Ch.  Past Medical History:  Diagnosis Date   Allergy    Arthritis    Hyperlipidemia     Current Outpatient Medications:    cyclobenzaprine (FLEXERIL) 5 MG tablet, TK 1 TO 2 TS PO Q 8 TO 12 H PRN, Disp: , Rfl:    Ibuprofen-Acetaminophen 125-250 MG TABS, Take by mouth., Disp: , Rfl:    nortriptyline (PAMELOR) 25 MG capsule, Take 1 capsule (25 mg total) by mouth at bedtime., Disp: 30 capsule, Rfl: 5   pregabalin (LYRICA) 25 MG capsule, Take 1 capsule (25 mg total) by mouth 3 (three) times daily for 15 days, THEN 2 capsules (50 mg total) 3 (three) times daily., Disp: 315 capsule, Rfl: 0  Social History   Tobacco Use  Smoking Status Every Day   Packs/day: 0.50   Years: 26.00   Pack years: 13.00   Types: Cigarettes  Smokeless Tobacco Never    No Known Allergies Objective:  There were no vitals filed for this visit. There is no height or weight on file to calculate BMI. Constitutional Well developed. Well nourished.  Vascular Dorsalis pedis pulses palpable bilaterally. Posterior tibial pulses palpable bilaterally. Capillary refill normal to all digits.  No cyanosis or clubbing noted. Pedal hair growth normal.  Neurologic Normal  speech. Oriented to person, place, and time. Epicritic sensation to light touch grossly present bilaterally.  Dermatologic Nails well groomed and normal in appearance. No open wounds. No skin lesions.  Orthopedic: Normal joint ROM without pain or crepitus bilaterally. No visible deformities. Tender to palpation at the calcaneal tuber bilaterally. No pain with calcaneal squeeze bilaterally. Ankle ROM diminished range of motion bilaterally. Silfverskiold Test: positive bilaterally.   Radiographs: Taken and reviewed. No acute fractures or dislocations. No evidence of stress fracture.  Plantar heel spur present. Posterior heel spur present.   Assessment:   1. Plantar fasciitis of right foot   2. Heel spur, right   3. Heel spur, left   4. Plantar fasciitis of left foot    Plan:  Patient was evaluated and treated and all questions answered.  Plantar Fasciitis, bilaterally with underlying bilateral heel spur - XR reviewed as above.  - Educated on icing and stretching. Instructions given.  - Injection delivered to the plantar fascia as below. -I discussed with the patient that his partner is not the cause of the pain as opposed to being more of a result of the plantar fascial constantly pulling on the heel bone leading to formation of the spur. - DME: Plantar fascial brace dispensed to support the medial longitudinal arch of the foot and offload pressure from the heel and prevent arch collapse during weightbearing -  Pharmacologic management: None  Procedure: Injection Tendon/Ligament Location: Bilateral plantar fascia at the glabrous junction; medial approach. Skin Prep: alcohol Injectate: 0.5 cc 0.5% marcaine plain, 0.5 cc of 1% Lidocaine, 0.5 cc kenalog 10. Disposition: Patient tolerated procedure well. Injection site dressed with a band-aid.  No follow-ups on file.

## 2021-06-15 ENCOUNTER — Encounter: Payer: 59 | Admitting: Student in an Organized Health Care Education/Training Program

## 2021-06-21 ENCOUNTER — Other Ambulatory Visit: Payer: Self-pay | Admitting: Pain Medicine

## 2021-06-21 ENCOUNTER — Encounter: Payer: Self-pay | Admitting: Gastroenterology

## 2021-06-21 ENCOUNTER — Telehealth: Payer: Self-pay | Admitting: Student in an Organized Health Care Education/Training Program

## 2021-06-21 DIAGNOSIS — M5412 Radiculopathy, cervical region: Secondary | ICD-10-CM

## 2021-06-21 MED ORDER — PREGABALIN 50 MG PO CAPS: ORAL_CAPSULE | ORAL | 0 refills | Status: DC

## 2021-06-21 NOTE — Telephone Encounter (Signed)
Spoke to patient and she states that she got a 2 week supply of Lyrica.  States she spoke to a nurse here and they were supossed to get Dr Holley Raring to send in another prescription.  She was unsure as to why she only got a 2 week prescritpion.  I will call pharmacy to see what the issue was.  The pharmacist is on lunch break right now.  Will try again later,.

## 2021-06-21 NOTE — Telephone Encounter (Signed)
Patient states she was supposed to have a script sent to her pharmacy [facility] in [place]. Please call patient asap. No script sent.

## 2021-06-21 NOTE — Telephone Encounter (Signed)
Pharmacy states that her insurance would not cover the Lyrica 25 mg tid, but would cover the 50 mg.  Spoke with Dr Dossie Arbour.  He will order the patient Lyrica 50 mg bid x 15 days then Lyrica 50 mg tid.  Patient notified and informed that she would need to make an appointment before 1-26 for refills.

## 2021-07-07 ENCOUNTER — Ambulatory Visit: Payer: 59 | Admitting: Podiatry

## 2021-07-12 ENCOUNTER — Ambulatory Visit: Payer: 59 | Admitting: Podiatry

## 2021-07-13 ENCOUNTER — Other Ambulatory Visit: Payer: Self-pay

## 2021-07-13 ENCOUNTER — Encounter: Payer: Self-pay | Admitting: Student in an Organized Health Care Education/Training Program

## 2021-07-13 ENCOUNTER — Ambulatory Visit
Payer: 59 | Attending: Student in an Organized Health Care Education/Training Program | Admitting: Student in an Organized Health Care Education/Training Program

## 2021-07-13 DIAGNOSIS — G894 Chronic pain syndrome: Secondary | ICD-10-CM

## 2021-07-13 DIAGNOSIS — M549 Dorsalgia, unspecified: Secondary | ICD-10-CM

## 2021-07-13 DIAGNOSIS — M5412 Radiculopathy, cervical region: Secondary | ICD-10-CM | POA: Diagnosis not present

## 2021-07-13 DIAGNOSIS — G5793 Unspecified mononeuropathy of bilateral lower limbs: Secondary | ICD-10-CM

## 2021-07-13 MED ORDER — NORTRIPTYLINE HCL 25 MG PO CAPS: 25.0000 mg | ORAL_CAPSULE | Freq: Every day | ORAL | 5 refills | Status: DC

## 2021-07-13 MED ORDER — CYCLOBENZAPRINE HCL 5 MG PO TABS: 5.0000 mg | ORAL_TABLET | Freq: Two times a day (BID) | ORAL | 1 refills | Status: DC | PRN

## 2021-07-13 MED ORDER — PREGABALIN 50 MG PO CAPS: ORAL_CAPSULE | ORAL | 2 refills | Status: DC

## 2021-07-13 NOTE — Progress Notes (Signed)
PROVIDER NOTE: Information contained herein reflects review and annotations entered in association with encounter. Interpretation of such information and data should be left to medically-trained personnel. Information provided to patient can be located elsewhere in the medical record under "Patient Instructions". Document created using STT-dictation technology, any transcriptional errors that may result from process are unintentional.    Patient: Erica Walters  Service Category: E/M  Provider: Gillis Santa, MD  DOB: 03-18-1966  DOS: 07/13/2021  Specialty: Interventional Pain Management  MRN: [id number]  Setting: Ambulatory outpatient  PCP: Susy Frizzle, MD  Type: Established Patient    Referring Provider: Susy Frizzle, MD  Location: Office  Delivery: Face-to-face     HPI  Ms. Erica Walters, a 56 y.o. year old female, is here today because of her No primary diagnosis found.. Erica Walters's primary complain today is Back Pain (lower)  Last encounter: My last encounter with her was on 06/06/2021 Pertinent problems: Ms. Demarais has Cervical radiculopathy; DDD (degenerative disc disease), cervical; and Bilateral plantar fasciitis on their pertinent problem list. Pain Assessment: Severity of Chronic pain is reported as a 2 /10. Location: Back Right, Left/pain radiaties to her hip. Onset: More than a month ago. Quality: Aching, Constant, Throbbing, Sharp. Timing: Constant. Modifying factor(s): meds and laying down. Vitals:  height is _0  (1.702 m) and weight is 199 lb (90.3 kg). Her temperature is 97 F (36.1 C) (abnormal). Her blood pressure is 130/86 and her pulse is 93. Her respiration is 15 and oxygen saturation is 99%.   Reason for encounter: medication management.   Patient is noticing benefit with Lyrica.  It is helping manage her pain better.  She is noticing some dizziness during the day.  I recommend that she change her dose to 50 mg every morning, 100 mg nightly.  I will also  refill her Flexeril and nortriptyline as below.  Follow-up in 3 months for medication management.   06/06/21: No benefit with increase gabapentin to 900 mg 3 times a day.  She is noticing issues with memory and states that she is more forgetful.  Recommend that she discontinue gabapentin and start Lyrica as below.  03/14/21: Patient presents today for medication management.  At her last clinic visits, gabapentin was increased to 600 mg 3 times a day.  Patient is finding benefit with dose increase.  I recommend that she increase her dose of 900 mg 3 times a day.  I cautioned her on the risks and side effects at this dose.  Future considerations include Lyrica versus Cymbalta.  HPI and A&P from initial clinic visit: Presents today for bilateral feet pain onset December 2019, she began having achy pain over the lateral foot which is worse with pressure on the right foot, such as tight shoes.  Sharp pain radiates over the top of her foot. In her left foot, she has sensation as if she is standing on something.  She has numbness at the tip of her little toe on the right.  NCS/EMG of the legs in February 2021 was normal. States that she has bony spurs in bilateral feet. She has had injections in both of her feet with limited response.   Also endorses sharp pain in the low back and stiffness in the knees.  MRI lumbar spine shows disc bulge at L3-4, no nerve impingement.  She has been seeing a back specialists and completed physical therapy and back injections with no relief. She has had previous L-ESI without response. She works at [employer]  and reports symptoms are worse after standing all day.    She is referred here by PCP    NCS/EMG of the legs 08/05/19:  This is a normal study of the lower extremities.  In particular, there is no evidence of a sensorimotor polyneuropathy or lumbosacral radiculopathy.     MRI lumbar spine 04/11/19: L3-4: Moderate disc bulge. Mild narrowing of both lateral recesses but  without visible neural compression.   Mild, non-compressive disc bulges at the other lumbar levels. Mild facet osteoarthritis at L4-5 and L5-S1 that could contribute to low back pain or referred facet syndrome pain. No abnormality clearly explains the numbness in the feet.  Chronic pain syndrome.  Unclear etiology.  Lumbar MRI is largely unremarkable and shows lumbar facet arthritis.  Discussed physical therapy and stretching with patient.  She is on ibuprofen and acetaminophen combination.  Encouraged her to continue with that.  Future considerations could include diagnostic lumbar facet medial branch nerve blocks.  Nerve conduction velocity/EMG study unremarkable.  B12 levels within normal limits.  Lab work reviewed.  No organic cause for neuropathic pain of lower extremity.  Recommend she increase her gabapentin below as well as her nortriptyline.  Recommend EKG for future consideration of lidocaine infusion for peripheral neuropathic pain.   Do not recommend chronic opioid therapy for this condition.    ROS  Constitutional: Denies any fever or chills Gastrointestinal: No reported hemesis, hematochezia, vomiting, or acute GI distress Musculoskeletal: Denies any acute onset joint swelling, redness, loss of ROM, or weakness Neurological: No reported episodes of acute onset apraxia, aphasia, dysarthria, agnosia, amnesia, paralysis, loss of coordination, or loss of consciousness  Medication Review  Ibuprofen-Acetaminophen, cyclobenzaprine, nortriptyline, and pregabalin  History Review  Allergy: Erica Walters has No Known Allergies. Drug: Erica Walters  reports no history of drug use. Alcohol:  reports current alcohol use of about 14.0 standard drinks per week. Tobacco:  reports that she has been smoking cigarettes. She has a 13.00 pack-year smoking history. She has never used smokeless tobacco. Social: Erica Walters  reports that she has been smoking cigarettes. She has a 13.00 pack-year  smoking history. She has never used smokeless tobacco. She reports current alcohol use of about 14.0 standard drinks per week. She reports that she does not use drugs. Medical:  has a past medical history of Allergy, Arthritis, and Hyperlipidemia. Surgical: Erica Walters  has a past surgical history that includes Total abdominal hysterectomy w/ bilateral salpingoophorectomy and Colonoscopy (12/04/2017). Family: family history includes Colon cancer in her paternal grandfather.  Laboratory Chemistry Profile   Renal Lab Results  Component Value Date   BUN 11 01/23/2019   CREATININE 0.81 96/09/5407   BCR NOT APPLICABLE 81/19/1478   GFRAA 97 01/23/2019   GFRNONAA 84 01/23/2019    Hepatic Lab Results  Component Value Date   AST 44 (H) 01/23/2019   ALT 62 (H) 01/23/2019   ALBUMIN 4.1 07/19/2015   ALKPHOS 83 07/19/2015   LIPASE 30 07/19/2015    Electrolytes Lab Results  Component Value Date   NA 141 01/23/2019   K 4.8 01/23/2019   CL 106 01/23/2019   CALCIUM 9.7 01/23/2019    Bone Lab Results  Component Value Date   VD25OH 31 01/23/2019    Inflammation (CRP: Acute Phase) (ESR: Chronic Phase) Lab Results  Component Value Date   CRP 4.7 12/25/2018   ESRSEDRATE 14 12/25/2018         Note: Above Lab results reviewed.  Recent Imaging Review  DG  Foot Complete Right Please see detailed radiograph report in office note. DG Foot Complete Left Please see detailed radiograph report in office note.  Note: Reviewed        Physical Exam  General appearance: Well nourished, well developed, and well hydrated. In no apparent acute distress Mental status: Alert, oriented x 3 (person, place, & time)       Respiratory: No evidence of acute respiratory distress Eyes: PERLA Vitals: BP 130/86    Pulse 93    Temp (!) 97 F (36.1 C)    Resp 15    Ht _0  (1.702 m)    Wt 199 lb (90.3 kg)    SpO2 99%    BMI 31.17 kg/m  BMI: Estimated body mass index is 31.17 kg/m as calculated from the  following:   Height as of this encounter: _1  (1.702 m).   Weight as of this encounter: 199 lb (90.3 kg). Ideal: Ideal body weight: 61.6 kg (135 lb 12.9 oz) Adjusted ideal body weight: 73.1 kg (161 lb 1.3 oz)  Assessment   Status Diagnosis  Controlled Controlled Controlled 1. Cervical radiculopathy   2. Back pain, unspecified back location, unspecified back pain laterality, unspecified chronicity   3. Neuropathy involving both lower extremities   4. Chronic pain syndrome       Plan of Care    Erica Walters has a current medication list which includes the following long-term medication(s): nortriptyline and pregabalin.  Pharmacotherapy (Medications Ordered): Meds ordered this encounter  Medications   pregabalin (LYRICA) 50 MG capsule    Sig: 50 mg qAM, 100 mg QHS    Dispense:  90 capsule    Refill:  2   nortriptyline (PAMELOR) 25 MG capsule    Sig: Take 1 capsule (25 mg total) by mouth at bedtime.    Dispense:  30 capsule    Refill:  5    Requires office visit before any further refills can be given.   cyclobenzaprine (FLEXERIL) 5 MG tablet    Sig: Take 1 tablet (5 mg total) by mouth 3 times/day as needed-between meals & bedtime for muscle spasms.    Dispense:  90 tablet    Refill:  1    Do not place this medication, or any other prescription from our practice, on "Automatic Refill". Patient may have prescription filled one day early if pharmacy is closed on scheduled refill date.   Future considerations include Cymbalta, lidocaine infusion.  EKG within normal limits.  Orders:  No orders of the defined types were placed in this encounter.   Follow-up plan:   Return in about 3 months (around 10/11/2021) for Medication Management, in person.      Recent Visits Date Type Provider Dept  06/06/21 Office Visit Gillis Santa, MD [facility]  Showing recent visits within past 90 days and meeting all other requirements Today's Visits Date Type Provider  Dept  07/13/21 Office Visit Gillis Santa, MD [facility]  Showing today's visits and meeting all other requirements Future Appointments Date Type Provider Dept  08/01/21 Appointment Gillis Santa, MD [facility]  Showing future appointments within next 90 days and meeting all other requirements  I discussed the assessment and treatment plan with the patient. The patient was provided an opportunity to ask questions and all were answered. The patient agreed with the plan and demonstrated an understanding of the instructions.  Patient advised to call back or seek an in-person evaluation if the symptoms or condition  worsens.  Duration of encounter: 26mnutes.  Note by: BGillis Santa MD Date: 07/13/2021; Time: 10:09 AM

## 2021-07-13 NOTE — Progress Notes (Signed)
Nursing Pain Medication Assessment:  Safety precautions to be maintained throughout the outpatient stay will include: orient to surroundings, keep bed in low position, maintain call bell within reach at all times, provide assistance with transfer out of bed and ambulation.  

## 2021-07-20 ENCOUNTER — Other Ambulatory Visit: Payer: Self-pay

## 2021-07-20 ENCOUNTER — Ambulatory Visit (INDEPENDENT_AMBULATORY_CARE_PROVIDER_SITE_OTHER): Payer: 59 | Admitting: Podiatry

## 2021-07-20 ENCOUNTER — Encounter: Payer: Self-pay | Admitting: Podiatry

## 2021-07-20 DIAGNOSIS — M722 Plantar fascial fibromatosis: Secondary | ICD-10-CM

## 2021-07-20 DIAGNOSIS — Q666 Other congenital valgus deformities of feet: Secondary | ICD-10-CM

## 2021-07-20 NOTE — Progress Notes (Signed)
Subjective:  Patient ID: Erica Walters, female    DOB: 01/12/1966,  MRN: [id number]  Chief Complaint  Patient presents with   Plantar Fasciitis    Pt stated that she is doing much better     56 y.o. female presents with the above complaint.  Patient presents for follow-up of bilateral Planter fasciitis.  She states she is doing a lot better she is about 80% better.  She states she still has some residual pain.  She denies any other acute complaint she has brought her orthotics with them.   Review of Systems: Negative except as noted in the HPI. Denies N/V/F/Ch.  Past Medical History:  Diagnosis Date   Allergy    Arthritis    Hyperlipidemia     Current Outpatient Medications:    cyclobenzaprine (FLEXERIL) 5 MG tablet, Take 1 tablet (5 mg total) by mouth 3 times/day as needed-between meals & bedtime for muscle spasms., Disp: 90 tablet, Rfl: 1   Ibuprofen-Acetaminophen 125-250 MG TABS, Take by mouth., Disp: , Rfl:    nortriptyline (PAMELOR) 25 MG capsule, Take 1 capsule (25 mg total) by mouth at bedtime., Disp: 30 capsule, Rfl: 5   pregabalin (LYRICA) 50 MG capsule, 50 mg qAM, 100 mg QHS, Disp: 90 capsule, Rfl: 2  Social History   Tobacco Use  Smoking Status Every Day   Packs/day: 0.50   Years: 26.00   Pack years: 13.00   Types: Cigarettes  Smokeless Tobacco Never    No Known Allergies Objective:  There were no vitals filed for this visit. There is no height or weight on file to calculate BMI. Constitutional Well developed. Well nourished.  Vascular Dorsalis pedis pulses palpable bilaterally. Posterior tibial pulses palpable bilaterally. Capillary refill normal to all digits.  No cyanosis or clubbing noted. Pedal hair growth normal.  Neurologic Normal speech. Oriented to person, place, and time. Epicritic sensation to light touch grossly present bilaterally.  Dermatologic Nails well groomed and normal in appearance. No open wounds. No skin lesions.  Orthopedic:  Normal joint ROM without pain or crepitus bilaterally. No visible deformities. Tender to palpation at the calcaneal tuber bilaterally. No pain with calcaneal squeeze bilaterally. Ankle ROM diminished range of motion bilaterally. Silfverskiold Test: positive bilaterally.   Radiographs: Taken and reviewed. No acute fractures or dislocations. No evidence of stress fracture.  Plantar heel spur present. Posterior heel spur present.   Assessment:   1. Plantar fasciitis of right foot   2. Plantar fasciitis of left foot   3. Pes planovalgus     Plan:  Patient was evaluated and treated and all questions answered.  Plantar Fasciitis, bilaterally with underlying bilateral heel spur - XR reviewed as above.  - Educated on icing and stretching. Instructions given.  -Second injection delivered to the plantar fascia as below. -I discussed with the patient that his partner is not the cause of the pain as opposed to being more of a result of the plantar fascial constantly pulling on the heel bone leading to formation of the spur. - DME: Plantar fascial brace dispensed to support the medial longitudinal arch of the foot and offload pressure from the heel and prevent arch collapse during weightbearing - Pharmacologic management: None  Pes planovalgus -I explained to patient the etiology of pes planovalgus and relationship with Planter fasciitis and various treatment options were discussed.  Given patient foot structure in the setting of Planter fasciitis I believe patient will benefit from custom-made orthotics to help control the hindfoot motion support the  arch of the foot and take the stress away from plantar fascial.  Patient agrees with the plan like to proceed with orthotics -Orthotics were evaluated and seem to be functioning well.  I encouraged her to continue wearing them.  They are still in good shape and condition.   Procedure: Injection Tendon/Ligament Location: Bilateral plantar fascia at  the glabrous junction; medial approach. Skin Prep: alcohol Injectate: 0.5 cc 0.5% marcaine plain, 0.5 cc of 1% Lidocaine, 0.5 cc kenalog 10. Disposition: Patient tolerated procedure well. Injection site dressed with a band-aid.  No follow-ups on file.

## 2021-08-01 ENCOUNTER — Ambulatory Visit: Payer: 59 | Admitting: Student in an Organized Health Care Education/Training Program

## 2021-08-18 ENCOUNTER — Other Ambulatory Visit: Payer: Self-pay

## 2021-08-18 ENCOUNTER — Ambulatory Visit (INDEPENDENT_AMBULATORY_CARE_PROVIDER_SITE_OTHER): Payer: 59 | Admitting: Podiatry

## 2021-08-18 DIAGNOSIS — M722 Plantar fascial fibromatosis: Secondary | ICD-10-CM

## 2021-08-24 ENCOUNTER — Other Ambulatory Visit: Payer: Self-pay

## 2021-08-24 ENCOUNTER — Ambulatory Visit (INDEPENDENT_AMBULATORY_CARE_PROVIDER_SITE_OTHER): Payer: 59 | Admitting: Family Medicine

## 2021-08-24 ENCOUNTER — Encounter: Payer: Self-pay | Admitting: Family Medicine

## 2021-08-24 VITALS — BP 124/88 | HR 84 | Temp 96.8°F | Resp 18 | Ht 67.0 in | Wt 199.0 lb

## 2021-08-24 DIAGNOSIS — M5431 Sciatica, right side: Secondary | ICD-10-CM | POA: Diagnosis not present

## 2021-08-24 MED ORDER — OXYCODONE-ACETAMINOPHEN 5-325 MG PO TABS
1.0000 | ORAL_TABLET | ORAL | 0 refills | Status: AC | PRN
Start: 2021-08-24 — End: 2021-08-29

## 2021-08-24 MED ORDER — PREDNISONE 20 MG PO TABS: ORAL_TABLET | ORAL | 0 refills | Status: DC

## 2021-08-24 NOTE — Progress Notes (Signed)
? ?Subjective:  ? ? Patient ID: Erica Walters, female    DOB: 1965-07-26, 56 y.o.   MRN: [id number] ? ?HPI ?Patient tripped and fell yesterday landing directly on her hands and knees on a concrete slab.  She developed severe pain in the center of her back roughly around the level of L4.  It is radiating into her posterior right gluteus and down her right leg.  With prolonged standing, her legs feel like they are going to buckle due to weakness.  She reports neuropathic pain radiating into the right upper leg and anterior left leg.  She denies any saddle anesthesia, bowel or bladder incontinence.  She does have palpable muscle spasms in her lower back on the left-hand side.  She had an MRI in 2020 that showed bulging disc at L3-L4 with indentation on the thecal sac but no significant spinal compression. ?Past Medical History:  ?Diagnosis Date  ? Allergy   ? Arthritis   ? Hyperlipidemia   ? ?Past Surgical History:  ?Procedure Laterality Date  ? COLONOSCOPY  12/04/2017  ? TOTAL ABDOMINAL HYSTERECTOMY W/ BILATERAL SALPINGOOPHORECTOMY    ? ?Current Outpatient Medications on File Prior to Visit  ?Medication Sig Dispense Refill  ? cyclobenzaprine (FLEXERIL) 5 MG tablet Take 1 tablet (5 mg total) by mouth 3 times/day as needed-between meals & bedtime for muscle spasms. 90 tablet 1  ? gabapentin (NEURONTIN) 300 MG capsule Take by mouth.    ? Ibuprofen-Acetaminophen 125-250 MG TABS Take by mouth.    ? nortriptyline (PAMELOR) 25 MG capsule Take 1 capsule (25 mg total) by mouth at bedtime. 30 capsule 5  ? pregabalin (LYRICA) 50 MG capsule 50 mg qAM, 100 mg QHS 90 capsule 2  ? ?No current facility-administered medications on file prior to visit.  ? ?No Known Allergies ?Social History  ? ?Socioeconomic History  ? Marital status: Married  ?  Spouse name: Not on file  ? Number of children: Not on file  ? Years of education: Not on file  ? Highest education level: Not on file  ?Occupational History  ? Not on file  ?Tobacco Use  ?  Smoking status: Every Day  ?  Packs/day: 0.50  ?  Years: 26.00  ?  Pack years: 13.00  ?  Types: Cigarettes  ? Smokeless tobacco: Never  ?Vaping Use  ? Vaping Use: Never used  ?Substance and Sexual Activity  ? Alcohol use: Yes  ?  Alcohol/week: 14.0 standard drinks  ?  Types: 14 Glasses of wine per week  ?  Comment: daily wine per pt  ? Drug use: Never  ? Sexual activity: Not Currently  ?Other Topics Concern  ? Not on file  ?Social History Narrative  ? Not on file  ? ?Social Determinants of Health  ? ?Financial Resource Strain: Not on file  ?Food Insecurity: Not on file  ?Transportation Needs: Not on file  ?Physical Activity: Not on file  ?Stress: Not on file  ?Social Connections: Not on file  ?Intimate Partner Violence: Not on file  ? ? ? ?Review of Systems  ?All other systems reviewed and are negative. ? ?   ?Objective:  ? Physical Exam ?Constitutional:   ?   General: She is not in acute distress. ?   Appearance: She is obese. She is not ill-appearing or toxic-appearing.  ?Cardiovascular:  ?   Rate and Rhythm: Normal rate and regular rhythm.  ?   Heart sounds: Normal heart sounds.  ?Pulmonary:  ?   Effort:  Pulmonary effort is normal.  ?   Breath sounds: Normal breath sounds.  ?Musculoskeletal:  ?   Lumbar back: Spasms and tenderness present. No bony tenderness. Decreased range of motion.  ?     Legs: ? ?Feet:  ?   Right foot:  ?   Skin integrity: Skin integrity normal. No ulcer, blister or skin breakdown.  ?   Left foot:  ?   Skin integrity: Skin integrity normal. No ulcer, blister or skin breakdown.  ?Neurological:  ?   Mental Status: She is alert.  ? ? ? ? ? ?   ?Assessment & Plan:  ?Right sided sciatica ?I believe the patient may have herniated disc in her lower back yesterday when she fell.  Recommended a prednisone taper pack to help with swelling and inflammation and to improve nerve impingement hopefully.  She can use Percocet 5/325 1 p.o. every 6 hours as needed pain.  She can also use muscle relaxers for  the muscle spasms in her back if these work better.  Reassess in 1 week or sooner if worse ?

## 2021-08-24 NOTE — Progress Notes (Signed)
Subjective:  Patient ID: Erica Walters, female    DOB: May 30, 1966,  MRN: [id number]  Chief Complaint  Patient presents with   Plantar Fasciitis    Right foot pt stated that she is doing better but still about 80% better     56 y.o. female presents with the above complaint.  Patient presents for follow-up of bilateral Planter fasciitis.  She states feels a lot better.  She still has about some residual pain.  She is about 80% but it is much more tolerable and manageable.  She denies any other acute complaints.   Review of Systems: Negative except as noted in the HPI. Denies N/V/F/Ch.  Past Medical History:  Diagnosis Date   Allergy    Arthritis    Hyperlipidemia     Current Outpatient Medications:    cyclobenzaprine (FLEXERIL) 5 MG tablet, Take 1 tablet (5 mg total) by mouth 3 times/day as needed-between meals & bedtime for muscle spasms., Disp: 90 tablet, Rfl: 1   Ibuprofen-Acetaminophen 125-250 MG TABS, Take by mouth., Disp: , Rfl:    nortriptyline (PAMELOR) 25 MG capsule, Take 1 capsule (25 mg total) by mouth at bedtime., Disp: 30 capsule, Rfl: 5   pregabalin (LYRICA) 50 MG capsule, 50 mg qAM, 100 mg QHS, Disp: 90 capsule, Rfl: 2  Social History   Tobacco Use  Smoking Status Every Day   Packs/day: 0.50   Years: 26.00   Pack years: 13.00   Types: Cigarettes  Smokeless Tobacco Never    No Known Allergies Objective:  There were no vitals filed for this visit. There is no height or weight on file to calculate BMI. Constitutional Well developed. Well nourished.  Vascular Dorsalis pedis pulses palpable bilaterally. Posterior tibial pulses palpable bilaterally. Capillary refill normal to all digits.  No cyanosis or clubbing noted. Pedal hair growth normal.  Neurologic Normal speech. Oriented to person, place, and time. Epicritic sensation to light touch grossly present bilaterally.  Dermatologic Nails well groomed and normal in appearance. No open wounds. No skin  lesions.  Orthopedic: Normal joint ROM without pain or crepitus bilaterally. No visible deformities. No further tender to palpation at the calcaneal tuber bilaterally. No pain with calcaneal squeeze bilaterally. Ankle ROM diminished range of motion bilaterally. Silfverskiold Test: positive bilaterally.   Radiographs: Taken and reviewed. No acute fractures or dislocations. No evidence of stress fracture.  Plantar heel spur present. Posterior heel spur present.   Assessment:   1. Plantar fasciitis of right foot   2. Plantar fasciitis of left foot      Plan:  Patient was evaluated and treated and all questions answered.  Plantar Fasciitis, bilaterally with underlying bilateral heel spur -Clinically her pain has gotten much better.  She has improved considerably.  At this time I discussed shoe gear modification and importance of orthotics.  The current orthotics are still in good shape.  Affinity foot and ankle issues arise in future of asked her to come see me.  She states understanding  Pes planovalgus -I explained to patient the etiology of pes planovalgus and relationship with Planter fasciitis and various treatment options were discussed.  Given patient foot structure in the setting of Planter fasciitis I believe patient will benefit from custom-made orthotics to help control the hindfoot motion support the arch of the foot and take the stress away from plantar fascial.  Patient agrees with the plan like to proceed with orthotics -Orthotics were evaluated and seem to be functioning well.  I encouraged her to continue  wearing them.  They are still in good shape and condition.   No follow-ups on file.

## 2021-09-01 ENCOUNTER — Other Ambulatory Visit: Payer: Self-pay

## 2021-09-01 ENCOUNTER — Ambulatory Visit (INDEPENDENT_AMBULATORY_CARE_PROVIDER_SITE_OTHER): Payer: 59 | Admitting: Family Medicine

## 2021-09-01 ENCOUNTER — Encounter: Payer: Self-pay | Admitting: Family Medicine

## 2021-09-01 VITALS — BP 132/72 | HR 61 | Temp 96.6°F | Resp 18 | Ht 67.0 in | Wt 199.0 lb

## 2021-09-01 DIAGNOSIS — R300 Dysuria: Secondary | ICD-10-CM

## 2021-09-01 LAB — URINALYSIS, ROUTINE W REFLEX MICROSCOPIC
Bacteria, UA: NONE SEEN /HPF
Bilirubin Urine: NEGATIVE
Glucose, UA: NEGATIVE
Hyaline Cast: NONE SEEN /LPF
Ketones, ur: NEGATIVE
Nitrite: NEGATIVE
Protein, ur: NEGATIVE
Specific Gravity, Urine: 1.01 (ref 1.001–1.035)
pH: 6 (ref 5.0–8.0)

## 2021-09-01 LAB — MICROSCOPIC MESSAGE

## 2021-09-01 MED ORDER — SULFAMETHOXAZOLE-TRIMETHOPRIM 800-160 MG PO TABS: 1.0000 | ORAL_TABLET | Freq: Two times a day (BID) | ORAL | 0 refills | Status: DC

## 2021-09-01 NOTE — Progress Notes (Signed)
? ?Subjective:  ? ? Patient ID: Erica Walters, female    DOB: 02-01-1966, 56 y.o.   MRN: [id number] ? ?HPI ?08/24/21 ?Patient tripped and fell yesterday landing directly on her hands and knees on a concrete slab.  She developed severe pain in the center of her back roughly around the level of L4.  It is radiating into her posterior right gluteus and down her right leg.  With prolonged standing, her legs feel like they are going to buckle due to weakness.  She reports neuropathic pain radiating into the right upper leg and anterior left leg.  She denies any saddle anesthesia, bowel or bladder incontinence.  She does have palpable muscle spasms in her lower back on the left-hand side.  She had an MRI in 2020 that showed bulging disc at L3-L4 with indentation on the thecal sac but no significant spinal compression.  At that time, my plan was: ? ?I believe the patient may have herniated disc in her lower back yesterday when she fell.  Recommended a prednisone taper pack to help with swelling and inflammation and to improve nerve impingement hopefully.  She can use Percocet 5/325 1 p.o. every 6 hours as needed pain.  She can also use muscle relaxers for the muscle spasms in her back if these work better.  Reassess in 1 week or sooner if worse ? ?09/01/21 ?Positive note, the pain going down her leg has improved.  The leg no longer feels like it is going to give way underneath her.  However she continues to have severe pain in her back.  It hurts to stand for prolonged periods of time.  It hurts to walk.  She is unable to lift weight due to pain in her back.  Earlier this week she developed frequency and dysuria.  She is also had trace hematuria.  She sees blood on the toilet tissue when she wipes.  She continues to have dysuria and burning when she pees.  Today her urinalysis is slightly cloudy.  She has +2 blood and +2 leukocyte esterase.  She denies any fevers or chills. ?Past Medical History:  ?Diagnosis Date  ? Allergy    ? Arthritis   ? Hyperlipidemia   ? ?Past Surgical History:  ?Procedure Laterality Date  ? COLONOSCOPY  12/04/2017  ? TOTAL ABDOMINAL HYSTERECTOMY W/ BILATERAL SALPINGOOPHORECTOMY    ? ?Current Outpatient Medications on File Prior to Visit  ?Medication Sig Dispense Refill  ? cyclobenzaprine (FLEXERIL) 5 MG tablet Take 1 tablet (5 mg total) by mouth 3 times/day as needed-between meals & bedtime for muscle spasms. 90 tablet 1  ? Ibuprofen-Acetaminophen 125-250 MG TABS Take by mouth.    ? nortriptyline (PAMELOR) 25 MG capsule Take 1 capsule (25 mg total) by mouth at bedtime. 30 capsule 5  ? predniSONE (DELTASONE) 20 MG tablet 3 tabs poqday 1-2, 2 tabs poqday 3-4, 1 tab poqday 5-6 12 tablet 0  ? pregabalin (LYRICA) 50 MG capsule 50 mg qAM, 100 mg QHS 90 capsule 2  ? ?No current facility-administered medications on file prior to visit.  ? ?No Known Allergies ?Social History  ? ?Socioeconomic History  ? Marital status: Married  ?  Spouse name: Not on file  ? Number of children: Not on file  ? Years of education: Not on file  ? Highest education level: Not on file  ?Occupational History  ? Not on file  ?Tobacco Use  ? Smoking status: Every Day  ?  Packs/day: 0.50  ?  Years: 26.00  ?  Pack years: 13.00  ?  Types: Cigarettes  ? Smokeless tobacco: Never  ?Vaping Use  ? Vaping Use: Never used  ?Substance and Sexual Activity  ? Alcohol use: Yes  ?  Alcohol/week: 14.0 standard drinks  ?  Types: 14 Glasses of wine per week  ?  Comment: daily wine per pt  ? Drug use: Never  ? Sexual activity: Not Currently  ?Other Topics Concern  ? Not on file  ?Social History Narrative  ? Not on file  ? ?Social Determinants of Health  ? ?Financial Resource Strain: Not on file  ?Food Insecurity: Not on file  ?Transportation Needs: Not on file  ?Physical Activity: Not on file  ?Stress: Not on file  ?Social Connections: Not on file  ?Intimate Partner Violence: Not on file  ? ? ? ?Review of Systems  ?All other systems reviewed and are negative. ? ?    ?Objective:  ? Physical Exam ?Constitutional:   ?   General: She is not in acute distress. ?   Appearance: She is obese. She is not ill-appearing or toxic-appearing.  ?Cardiovascular:  ?   Rate and Rhythm: Normal rate and regular rhythm.  ?   Heart sounds: Normal heart sounds.  ?Pulmonary:  ?   Effort: Pulmonary effort is normal.  ?   Breath sounds: Normal breath sounds.  ?Musculoskeletal:  ?   Lumbar back: Tenderness present. Decreased range of motion.  ?Feet:  ?   Right foot:  ?   Skin integrity: Skin integrity normal. No ulcer, blister or skin breakdown.  ?   Left foot:  ?   Skin integrity: Skin integrity normal. No ulcer, blister or skin breakdown.  ?Neurological:  ?   Mental Status: She is alert.  ? ? ? ? ? ?   ?Assessment & Plan:  ?Dysuria - Plan: Urinalysis, Routine w reflex microscopic ?I suspect cystitis.  Begin Bactrim double strength tablets twice daily for 5 to 7 days depending on symptoms.  I also wrote the patient a note for work saying that she is unable to lift more than 10 pounds and she needs to be able to alternate between sitting and standing as necessary due to the pain in her back. ?

## 2021-09-19 ENCOUNTER — Encounter: Payer: Self-pay | Admitting: Family Medicine

## 2021-09-20 NOTE — Telephone Encounter (Signed)
Please advice  

## 2021-09-21 ENCOUNTER — Other Ambulatory Visit: Payer: Self-pay | Admitting: Family Medicine

## 2021-09-21 MED ORDER — OXYCODONE-ACETAMINOPHEN 5-325 MG PO TABS: 1.0000 | ORAL_TABLET | ORAL | 0 refills | Status: DC | PRN

## 2021-09-21 NOTE — Telephone Encounter (Signed)
Sorry, can you please resend, sent to wrong to pharmacy. Previous Rx cancelled.  ?

## 2021-09-21 NOTE — Telephone Encounter (Signed)
Patient called stating that she needs the oxycodone sent to the [facility] at [place]. She states it was sent in but to the wrong pharmacy and she would have to make a special trip to the [place] it was called into. She states the one in [place] is close to her work.  ? ?CB# [phone] ?

## 2021-10-05 ENCOUNTER — Ambulatory Visit
Admission: RE | Admit: 2021-10-05 | Discharge: 2021-10-05 | Disposition: A | Discharge status: Home / Self Care | Payer: 59 | Source: Ambulatory Visit | Attending: Student in an Organized Health Care Education/Training Program | Admitting: Student in an Organized Health Care Education/Training Program

## 2021-10-05 ENCOUNTER — Encounter: Payer: Self-pay | Admitting: Student in an Organized Health Care Education/Training Program

## 2021-10-05 ENCOUNTER — Ambulatory Visit (HOSPITAL_BASED_OUTPATIENT_CLINIC_OR_DEPARTMENT_OTHER): Payer: 59 | Admitting: Student in an Organized Health Care Education/Training Program

## 2021-10-05 ENCOUNTER — Ambulatory Visit
Admission: RE | Admit: 2021-10-05 | Discharge: 2021-10-05 | Disposition: A | Discharge status: Home / Self Care | Payer: 59 | Attending: Student in an Organized Health Care Education/Training Program | Admitting: Student in an Organized Health Care Education/Training Program

## 2021-10-05 VITALS — BP 125/93 | HR 98 | Temp 96.9°F | Resp 18 | Ht 66.0 in | Wt 198.8 lb

## 2021-10-05 DIAGNOSIS — M549 Dorsalgia, unspecified: Secondary | ICD-10-CM | POA: Insufficient documentation

## 2021-10-05 DIAGNOSIS — G894 Chronic pain syndrome: Secondary | ICD-10-CM | POA: Insufficient documentation

## 2021-10-05 DIAGNOSIS — M5412 Radiculopathy, cervical region: Secondary | ICD-10-CM

## 2021-10-05 DIAGNOSIS — M47816 Spondylosis without myelopathy or radiculopathy, lumbar region: Secondary | ICD-10-CM | POA: Insufficient documentation

## 2021-10-05 DIAGNOSIS — Z6832 Body mass index (BMI) 32.0-32.9, adult: Secondary | ICD-10-CM

## 2021-10-05 DIAGNOSIS — M5136 Other intervertebral disc degeneration, lumbar region: Secondary | ICD-10-CM

## 2021-10-05 DIAGNOSIS — G5793 Unspecified mononeuropathy of bilateral lower limbs: Secondary | ICD-10-CM | POA: Insufficient documentation

## 2021-10-05 DIAGNOSIS — E6609 Other obesity due to excess calories: Secondary | ICD-10-CM

## 2021-10-05 MED ORDER — OZEMPIC (0.25 OR 0.5 MG/DOSE) 2 MG/1.5ML ~~LOC~~ SOPN: PEN_INJECTOR | SUBCUTANEOUS | 0 refills | Status: AC

## 2021-10-05 MED ORDER — NORTRIPTYLINE HCL 50 MG PO CAPS: 50.0000 mg | ORAL_CAPSULE | Freq: Every day | ORAL | 5 refills | Status: DC

## 2021-10-05 MED ORDER — PREGABALIN 50 MG PO CAPS: 100.0000 mg | ORAL_CAPSULE | Freq: Two times a day (BID) | ORAL | 5 refills | Status: DC

## 2021-10-05 NOTE — Progress Notes (Signed)
PROVIDER NOTE: Information contained herein reflects review and annotations entered in association with encounter. Interpretation of such information and data should be left to medically-trained personnel. Information provided to patient can be located elsewhere in the medical record under "Patient Instructions". Document created using STT-dictation technology, any transcriptional errors that may result from process are unintentional.  ?  ?Patient: Erica Walters  Service Category: E/M  Provider: Gillis Santa, MD  ?DOB: [birth year]  DOS: 10/05/2021  Specialty: Interventional Pain Management  ?MRN: [id number]  Setting: Ambulatory outpatient  PCP: Susy Frizzle, MD  ?Type: Established Patient    Referring Provider: Susy Frizzle, MD  ?Location: Office  Delivery: Face-to-face    ? ?HPI  ?Erica Walters, a 56 y.o. year old female, is here today because of her Lumbar facet arthropathy [M47.816]. Erica Walters's primary complain today is Back Pain ?Last encounter: My last encounter with her was on 07/13/2021. ?Pertinent problems: Erica Walters has Cervical radiculopathy; DDD (degenerative disc disease), cervical; and Bilateral plantar fasciitis on their pertinent problem list. ?Pain Assessment: Severity of Chronic pain is reported as a 5 /10. Location: Back Lower/radiates into left hip. Onset: More than a month ago. Quality: Hervey Ard, Shooting. Timing: Intermittent. Modifying factor(s): position changes, meds. ?Vitals:  height is 5\' 6"  (1.676 m) and weight is 200 lb (90.7 kg). Her temperature is 96.9 ?F (36.1 ?C) (abnormal). Her blood pressure is 125/93 (abnormal) and her pulse is 98. Her respiration is 18 and oxygen saturation is 97%.  ? ?Reason for encounter: Medication management and worsening low back pain. ? ?-Patient fell in February and noted increased low back pain as well as numbness and tingling in her legs.  She states that this is getting better but she still has intermittent painful episodes.  She is  wondering if she needs a lumbar MRI.  I informed her that we will start with a lumbar spine x-ray and see if that shows any changes. ?-We discussed increasing her Lyrica to 100 mg twice a day, she was previously on 50 mg every morning, 100 mg nightly ?-Refill of nortriptyline, continue 50 mg nightly ?-Recommend Ozempic for weight loss and obesity as this will also help out with her chronic pain ? ?ROS  ?Constitutional: Denies any fever or chills ?Gastrointestinal: No reported hemesis, hematochezia, vomiting, or acute GI distress ?Musculoskeletal:  Low back pain ?Neurological: No reported episodes of acute onset apraxia, aphasia, dysarthria, agnosia, amnesia, paralysis, loss of coordination, or loss of consciousness ? ?Medication Review  ?Ibuprofen-Acetaminophen, Semaglutide(0.25 or 0.5MG /DOS), cyclobenzaprine, nortriptyline, and pregabalin ? ?History Review  ?Allergy: Erica Walters has No Known Allergies. ?Drug: Erica Walters  reports no history of drug use. ?Alcohol:  reports current alcohol use of about 14.0 standard drinks per week. ?Tobacco:  reports that she has been smoking cigarettes. She has a 13.00 pack-year smoking history. She has never used smokeless tobacco. ?Social: Erica Walters  reports that she has been smoking cigarettes. She has a 13.00 pack-year smoking history. She has never used smokeless tobacco. She reports current alcohol use of about 14.0 standard drinks per week. She reports that she does not use drugs. ?Medical:  has a past medical history of Allergy, Arthritis, and Hyperlipidemia. ?Surgical: Erica Walters  has a past surgical history that includes Total abdominal hysterectomy w/ bilateral salpingoophorectomy and Colonoscopy (12/04/2017). ?Family: family history includes Colon cancer in her paternal grandfather. ? ?Laboratory Chemistry Profile  ? ?Renal ?Lab Results  ?Component Value Date  ? BUN 11 01/23/2019  ? CREATININE 0.81 01/23/2019  ?  BCR NOT APPLICABLE 26/33/3545  ? GFRAA 97  01/23/2019  ? GFRNONAA 84 01/23/2019  ?  Hepatic ?Lab Results  ?Component Value Date  ? AST 44 (H) 01/23/2019  ? ALT 62 (H) 01/23/2019  ? ALBUMIN 4.1 07/19/2015  ? ALKPHOS 83 07/19/2015  ? LIPASE 30 07/19/2015  ?  ?Electrolytes ?Lab Results  ?Component Value Date  ? NA 141 01/23/2019  ? K 4.8 01/23/2019  ? CL 106 01/23/2019  ? CALCIUM 9.7 01/23/2019  ?  Bone ?Lab Results  ?Component Value Date  ? VD25OH 31 01/23/2019  ?  ?Inflammation (CRP: Acute Phase) (ESR: Chronic Phase) ?Lab Results  ?Component Value Date  ? CRP 4.7 12/25/2018  ? ESRSEDRATE 14 12/25/2018  ?    ?  ? ?Note: Above Lab results reviewed. ? ?Recent Imaging Review  ?DG Foot Complete Right ?Please see detailed radiograph report in office note. ?DG Foot Complete Left ?Please see detailed radiograph report in office note. ?Note: Reviewed       ? ?Physical Exam  ?General appearance: Well nourished, well developed, and well hydrated. In no apparent acute distress ?Mental status: Alert, oriented x 3 (person, place, & time)       ?Respiratory: No evidence of acute respiratory distress ?Eyes: PERLA ?Vitals: BP (!) 125/93   Pulse 98   Temp (!) 96.9 ?F (36.1 ?C)   Resp 18   Ht 5\' 6"  (1.676 m)   Wt 200 lb (90.7 kg)   SpO2 97%   BMI 32.28 kg/m?  ?BMI: Estimated body mass index is 32.28 kg/m? as calculated from the following: ?  Height as of this encounter: 5\' 6"  (1.676 m). ?  Weight as of this encounter: 200 lb (90.7 kg). ?Ideal: Ideal body weight: 59.3 kg (130 lb 11.7 oz) ?Adjusted ideal body weight: 71.9 kg (158 lb 7 oz) ? ?Assessment  ? ?Diagnosis Status  ?1. Lumbar facet arthropathy   ?2. Cervical radiculopathy   ?3. Chronic pain syndrome   ?4. Back pain, unspecified back location, unspecified back pain laterality, unspecified chronicity   ?5. Neuropathy involving both lower extremities   ?6. Lumbar degenerative disc disease   ?7. Class 1 obesity due to excess calories with serious comorbidity and body mass index (BMI) of 32.0 to 32.9 in adult   ?  Controlled ?Controlled ?Controlled  ? ?Plan of Care  ?Problem-specific:  ?No problem-specific Assessment & Plan notes found for this encounter. ? ?Ms. Elzabeth Mcquerry has a current medication list which includes the following long-term medication(s): nortriptyline and pregabalin. ? ?Pharmacotherapy (Medications Ordered): ?Meds ordered this encounter  ?Medications  ? pregabalin (LYRICA) 50 MG capsule  ?  Sig: Take 2 capsules (100 mg total) by mouth 2 (two) times daily. 50 mg qAM, 100 mg QHS  ?  Dispense:  120 capsule  ?  Refill:  5  ? nortriptyline (PAMELOR) 50 MG capsule  ?  Sig: Take 1 capsule (50 mg total) by mouth at bedtime.  ?  Dispense:  30 capsule  ?  Refill:  5  ? Semaglutide,0.25 or 0.5MG /DOS, (OZEMPIC, 0.25 OR 0.5 MG/DOSE,) 2 MG/1.5ML SOPN  ?  Sig: Inject 0.25 mg into the skin once a week for 30 days, THEN 0.5 mg once a week.  ?  Dispense:  2.25 mL  ?  Refill:  0  ? ?Orders:  ?Orders Placed This Encounter  ?Procedures  ? DG Lumbar Spine Complete W/Bend  ?  Patient presents with axial pain with possible radicular component. Please assist [name] in  identifying specific level(s) and laterality of any additional findings such as: ?1. Facet (Zygapophyseal) joint DJD (Hypertrophy, space narrowing, subchondral sclerosis, and/or osteophyte formation) ?2. DDD and/or IVDD (Loss of disc height, desiccation, gas patterns, osteophytes, endplate sclerosis, or "Black disc disease") ?3. Pars defects ?4. Spondylolisthesis, spondylosis, and/or spondyloarthropathies (include Degree/Grade of displacement in mm) (stability) ?5. Vertebral body Fractures (acute/chronic) (state percentage of collapse) ?6. Demineralization (osteopenia/osteoporotic) ?7. Bone pathology ?8. Foraminal narrowing  ?9. Surgical changes  ?  Standing Status:   Future  ?  Standing Expiration Date:   11/04/2021  ?  Scheduling Instructions:  ?   Imaging must be done as soon as possible. Inform patient that order will expire within 30 days and I will not renew it.   ?  Order Specific Question:   Reason for Exam (SYMPTOM  OR DIAGNOSIS REQUIRED)  ?  Answer:   Low back pain  ?  Order Specific Question:   Is patient pregnant?  ?  Answer:   No  ?  Order Specific Question:   Preferred imaging

## 2021-10-05 NOTE — Progress Notes (Signed)
Safety precautions to be maintained throughout the outpatient stay will include: orient to surroundings, keep bed in low position, maintain call bell within reach at all times, provide assistance with transfer out of bed and ambulation.  

## 2021-10-05 NOTE — Patient Instructions (Addendum)
Nortriptyline and Lyrica and Ozempic have been escribed to your pharmacy. ? ?Ozempic- ?Multiple Dosages: ?Inject 0.25 mg into the skin once a week for 30 days, THEN 0.5 mg once a week. ? ?You will have Xray completed prior to next appt. With pain clinic. ?

## 2021-10-06 ENCOUNTER — Encounter: Payer: Self-pay | Admitting: Student in an Organized Health Care Education/Training Program

## 2021-10-09 ENCOUNTER — Telehealth: Payer: Self-pay

## 2021-10-09 NOTE — Telephone Encounter (Signed)
Pt called in to see if office had any samples of Semaglutide,0.25 or 0.'5MG'$ /DOS, (OZEMPIC, 0.25 OR 0.5 MG/DOSE,) 2. Pt stated that she lost 3 doses of this med. Please advise. ? ?Cb#: [phone] ? ?

## 2021-10-10 NOTE — Telephone Encounter (Signed)
We do not have samples of Ozempic. ? ?ATC patient, no answer and vm is full ?

## 2021-10-11 ENCOUNTER — Encounter: Payer: Self-pay | Admitting: Family Medicine

## 2021-10-13 ENCOUNTER — Encounter: Payer: Self-pay | Admitting: Student in an Organized Health Care Education/Training Program

## 2021-11-03 ENCOUNTER — Other Ambulatory Visit: Payer: Self-pay | Admitting: Family Medicine

## 2021-11-03 DIAGNOSIS — G629 Polyneuropathy, unspecified: Secondary | ICD-10-CM

## 2021-11-03 DIAGNOSIS — M5030 Other cervical disc degeneration, unspecified cervical region: Secondary | ICD-10-CM

## 2021-11-03 DIAGNOSIS — M5431 Sciatica, right side: Secondary | ICD-10-CM

## 2021-11-21 ENCOUNTER — Other Ambulatory Visit: Payer: Self-pay | Admitting: Student in an Organized Health Care Education/Training Program

## 2021-11-22 ENCOUNTER — Other Ambulatory Visit: Payer: Self-pay | Admitting: Student in an Organized Health Care Education/Training Program

## 2021-11-23 ENCOUNTER — Encounter: Payer: 59 | Admitting: Student in an Organized Health Care Education/Training Program

## 2021-11-27 ENCOUNTER — Telehealth: Payer: Self-pay | Admitting: Student in an Organized Health Care Education/Training Program

## 2021-11-27 DIAGNOSIS — E6609 Other obesity due to excess calories: Secondary | ICD-10-CM

## 2021-11-27 NOTE — Telephone Encounter (Signed)
Patient stated that pharmacy was needing to get into to get her meds refill. [address] , [facility]. Please give patient a call. Thanks

## 2021-11-27 NOTE — Telephone Encounter (Signed)
Patient ran out of her ozempic and is needing another prescription sent. Patient states she lost 10lbs in 3 weeks and was wanting for you to increase the dose. Next appt is not until July 13,2023.  Can you send in nex prescription for her?

## 2021-11-28 MED ORDER — SEMAGLUTIDE (1 MG/DOSE) 4 MG/3ML ~~LOC~~ SOPN: 1.0000 mg | PEN_INJECTOR | SUBCUTANEOUS | 1 refills | Status: DC

## 2021-11-28 NOTE — Addendum Note (Signed)
Addended by: Gillis Santa on: 11/28/2021 09:03 AM   Modules accepted: Orders

## 2021-11-30 ENCOUNTER — Encounter: Payer: Self-pay | Admitting: Student in an Organized Health Care Education/Training Program

## 2021-11-30 ENCOUNTER — Telehealth: Payer: Self-pay | Admitting: Student in an Organized Health Care Education/Training Program

## 2021-11-30 NOTE — Telephone Encounter (Signed)
Needs prior auth

## 2021-11-30 NOTE — Telephone Encounter (Signed)
Patient called and stated that she missed 2 doses of her meds , due to meds hasn't been called into the pharmacy. Please give patient a call. Thanks

## 2021-12-01 NOTE — Telephone Encounter (Signed)
Attempted to reach patient to complete PA, no answer and mailbox is full.

## 2021-12-01 NOTE — Telephone Encounter (Signed)
PA sent via [facility] fax. [phone] LP

## 2022-01-01 ENCOUNTER — Encounter (HOSPITAL_COMMUNITY): Payer: Self-pay

## 2022-01-01 ENCOUNTER — Ambulatory Visit (HOSPITAL_COMMUNITY)
Admission: EM | Admit: 2022-01-01 | Discharge: 2022-01-01 | Disposition: A | Discharge status: Home / Self Care | Payer: 59 | Attending: Urgent Care | Admitting: Urgent Care

## 2022-01-01 DIAGNOSIS — M5442 Lumbago with sciatica, left side: Secondary | ICD-10-CM

## 2022-01-01 MED ORDER — KETOROLAC TROMETHAMINE 60 MG/2ML IM SOLN
60.0000 mg | Freq: Once | INTRAMUSCULAR | Status: AC
  Administered 2022-01-01: 60 mg via INTRAMUSCULAR

## 2022-01-01 MED ORDER — CARISOPRODOL 350 MG PO TABS: 350.0000 mg | ORAL_TABLET | Freq: Three times a day (TID) | ORAL | 0 refills | Status: DC

## 2022-01-01 MED ORDER — KETOROLAC TROMETHAMINE 60 MG/2ML IM SOLN
INTRAMUSCULAR | Status: AC
  Filled 2022-01-01: qty 2

## 2022-01-01 MED ORDER — CELECOXIB 100 MG PO CAPS: 100.0000 mg | ORAL_CAPSULE | Freq: Two times a day (BID) | ORAL | 0 refills | Status: AC

## 2022-01-01 NOTE — ED Provider Notes (Signed)
[place]    CSN: [id number] Arrival date & time: 01/01/22  1147      History   Chief Complaint Chief Complaint  Patient presents with   Back Pain    HPI Erica Walters is a 56 y.o. female.   56 year old female presents today due to worsening back pain.  She has a longstanding history of chronic back issues, but states since yesterday it has seem to be flared.  Last lumbar x-ray was early April, which showed degenerative disc disease and facet arthropathy.  Patient denies any acute injury is only.  She states the pain feels like a tightness and reports radiation down her left lateral thigh, ending in her knee.  She denies burning and tingling of her lower extremity. Denies saddle anesthesia or bowel/bladder incontinence. Takes nortriptyline and gabapentin daily.  States she also takes numerous over-the-counter ibuprofens daily.  Has cyclobenzaprine at home, has not been effective.   Back Pain   Past Medical History:  Diagnosis Date   Allergy    Arthritis    Hyperlipidemia     Patient Active Problem List   Diagnosis Date Noted   Multiple adenomatous polyps 01/23/2019   Bilateral plantar fasciitis 01/23/2019   Hyperlipidemia, mixed 01/23/2019   Cervical radiculopathy 07/29/2018   DDD (degenerative disc disease), cervical 07/29/2018   Benign neoplasm of left ovary 08/11/2015   Smoker 03/16/2015    Past Surgical History:  Procedure Laterality Date   COLONOSCOPY  12/04/2017   TOTAL ABDOMINAL HYSTERECTOMY W/ BILATERAL SALPINGOOPHORECTOMY      OB History   No obstetric history on file.      Home Medications    Prior to Admission medications   Medication Sig Start Date End Date Taking? Authorizing Provider  carisoprodol (SOMA) 350 MG tablet Take 1 tablet (350 mg total) by mouth 3 (three) times daily. 01/01/22  Yes Cabria Micalizzi L, PA  celecoxib (CELEBREX) 100 MG capsule Take 1 capsule (100 mg total) by mouth 2 (two) times daily for 10 days. 01/01/22  01/11/22 Yes Oluwatamilore Starnes L, PA  nortriptyline (PAMELOR) 50 MG capsule Take 1 capsule (50 mg total) by mouth at bedtime. 10/05/21 04/03/22  Gillis Santa, MD  pregabalin (LYRICA) 50 MG capsule Take 2 capsules (100 mg total) by mouth 2 (two) times daily. 50 mg qAM, 100 mg QHS 10/05/21 04/03/22  Gillis Santa, MD  Semaglutide, 1 MG/DOSE, 4 MG/3ML SOPN Inject 1 mg into the skin once a week. 11/28/21   Gillis Santa, MD    Family History Family History  Problem Relation Age of Onset   Colon cancer Paternal Grandfather    Esophageal cancer Neg Hx    Liver cancer Neg Hx    Pancreatic cancer Neg Hx    Rectal cancer Neg Hx    Stomach cancer Neg Hx    Colon polyps Neg Hx     Social History Social History   Tobacco Use   Smoking status: Every Day    Packs/day: 0.50    Years: 26.00    Total pack years: 13.00    Types: Cigarettes   Smokeless tobacco: Never  Vaping Use   Vaping Use: Never used  Substance Use Topics   Alcohol use: Yes    Alcohol/week: 14.0 standard drinks of alcohol    Types: 14 Glasses of wine per week    Comment: daily wine per pt   Drug use: Never     Allergies   Patient has no known allergies.   Review of  Systems Review of Systems  Musculoskeletal:  Positive for back pain.  As per HPI   Physical Exam Triage Vital Signs ED Triage Vitals [01/01/22 1335]  Enc Vitals Group     BP (!) 141/89     Pulse Rate 86     Resp 18     Temp 98 F (36.7 C)     Temp Source Oral     SpO2 96 %     Weight      Height      Head Circumference      Peak Flow      Pain Score 10     Pain Loc      Pain Edu?      Excl. in [place]?    No data found.  Updated Vital Signs BP (!) 141/89 (BP Location: Left Arm)   Pulse 86   Temp 98 F (36.7 C) (Oral)   Resp 18   SpO2 96%   Visual Acuity Right Eye Distance:   Left Eye Distance:   Bilateral Distance:    Right Eye Near:   Left Eye Near:    Bilateral Near:     Physical Exam Vitals and nursing note reviewed.   Constitutional:      General: She is not in acute distress.    Appearance: Normal appearance. She is obese. She is not ill-appearing, toxic-appearing or diaphoretic.  HENT:     Head: Normocephalic and atraumatic.     Nose: Nose normal.     Mouth/Throat:     Mouth: Mucous membranes are moist.  Eyes:     Extraocular Movements: Extraocular movements intact.     Conjunctiva/sclera: Conjunctivae normal.     Pupils: Pupils are equal, round, and reactive to light.  Cardiovascular:     Rate and Rhythm: Normal rate.  Pulmonary:     Effort: Pulmonary effort is normal. No respiratory distress.  Abdominal:     Tenderness: There is no right CVA tenderness or left CVA tenderness.  Musculoskeletal:        General: Tenderness (pain primarily to L lower lumbar region) present. No swelling, deformity or signs of injury. Normal range of motion.     Cervical back: Normal range of motion and neck supple. No rigidity or tenderness.     Thoracic back: Normal. No spasms, tenderness or bony tenderness.     Lumbar back: Normal. No swelling, edema, deformity, signs of trauma, lacerations, spasms, tenderness or bony tenderness. Normal range of motion. Negative right straight leg raise test and negative left straight leg raise test. No scoliosis.     Right lower leg: No edema.     Left lower leg: No edema.     Comments: Negative seated SLR Normal reflexes Bilaterally  Lymphadenopathy:     Cervical: No cervical adenopathy.  Skin:    General: Skin is warm and dry.     Capillary Refill: Capillary refill takes less than 2 seconds.     Coloration: Skin is not jaundiced.     Findings: No erythema or rash.  Neurological:     General: No focal deficit present.     Mental Status: She is alert and oriented to person, place, and time.     Sensory: No sensory deficit.     Motor: No weakness.     Coordination: Coordination normal.     Gait: Gait normal.     Deep Tendon Reflexes: Reflexes normal.  Psychiatric:         Mood and  Affect: Mood normal.        Behavior: Behavior normal.      UC Treatments / Results  Labs (all labs ordered are listed, but only abnormal results are displayed) Labs Reviewed - No data to display  EKG   Radiology No results found.  Procedures Procedures (including critical care time)  Medications Ordered in UC Medications  ketorolac (TORADOL) injection 60 mg (60 mg Intramuscular Given 01/01/22 1423)    Initial Impression / Assessment and Plan / UC Course  I have reviewed the triage vital signs and the nursing notes.  Pertinent labs & imaging results that were available during my care of the patient were reviewed by me and considered in my medical decision making (see chart for details).     Acute flare of chronic back pain with L sided sciatica - Stop ibuprofen and flexeril. Change to celebrex and short course of soma. Toradol given in office, therefore do not start celebrex until tomorrow. Moist heat, massage. Strengthening exercises would be beneficial for back pain prevention. F/U with PCP for possible PT referral, tens unit, etc. ER precautions discussed  Final Clinical Impressions(s) / UC Diagnoses   Final diagnoses:  Acute midline low back pain with left-sided sciatica     Discharge Instructions      You were given an injection of ketorolac in our office, a potent anti-inflammatory medication. Please do not take any OTC NSAIDS (motrin, advil, aleve, naproxen, ibuprofen)/. We will start you on a safer long term NSAID (celebrex) to take      ED Prescriptions     Medication Sig Dispense Auth. Provider   celecoxib (CELEBREX) 100 MG capsule Take 1 capsule (100 mg total) by mouth 2 (two) times daily for 10 days. 20 capsule Jlyn Bracamonte L, PA   carisoprodol (SOMA) 350 MG tablet Take 1 tablet (350 mg total) by mouth 3 (three) times daily. 15 tablet Braylyn Kalter L, [place]      I have reviewed the PDMP during this encounter.   Chaney Malling,  [place] 01/01/22 1455

## 2022-01-01 NOTE — ED Triage Notes (Signed)
Pt c/o lower back pain radiating down lt leg since yesterday. Hx of chronic back pain. Took her muscle relaxer's with no relief. Denies new injury.

## 2022-01-01 NOTE — Discharge Instructions (Addendum)
You were given an injection of ketorolac in our office, a potent anti-inflammatory medication. Please do not take any OTC NSAIDS (motrin, advil, aleve, naproxen, ibuprofen)/. We will start you on a safer long term NSAID (celebrex) to take twice daily with food. Use moist heat to your lower back, talk to your PCP about getting a TENS unit. Take the muscle relaxer up to three times daily as needed, note this can cause sedation or drowsiness, so do not drive after taking. Consider PT - strengthening your back and core can help prevent recurrence of acute back pain. Use the attached handout as a guide.  Head to the ER if you develop tingling in your groin or incontinence of your bladder or bowels. Follow up with PCP to address chronic back pain issues.

## 2022-01-04 ENCOUNTER — Ambulatory Visit
Payer: 59 | Attending: Student in an Organized Health Care Education/Training Program | Admitting: Student in an Organized Health Care Education/Training Program

## 2022-01-04 ENCOUNTER — Encounter: Payer: Self-pay | Admitting: Student in an Organized Health Care Education/Training Program

## 2022-01-04 VITALS — BP 136/95 | HR 46 | Temp 96.7°F | Resp 16 | Ht 66.5 in | Wt 198.0 lb

## 2022-01-04 DIAGNOSIS — M5136 Other intervertebral disc degeneration, lumbar region: Secondary | ICD-10-CM | POA: Insufficient documentation

## 2022-01-04 DIAGNOSIS — G894 Chronic pain syndrome: Secondary | ICD-10-CM | POA: Insufficient documentation

## 2022-01-04 DIAGNOSIS — G5793 Unspecified mononeuropathy of bilateral lower limbs: Secondary | ICD-10-CM | POA: Diagnosis not present

## 2022-01-04 DIAGNOSIS — M5416 Radiculopathy, lumbar region: Secondary | ICD-10-CM | POA: Insufficient documentation

## 2022-01-04 DIAGNOSIS — M47816 Spondylosis without myelopathy or radiculopathy, lumbar region: Secondary | ICD-10-CM | POA: Insufficient documentation

## 2022-01-04 DIAGNOSIS — G8929 Other chronic pain: Secondary | ICD-10-CM | POA: Insufficient documentation

## 2022-01-04 MED ORDER — TRAMADOL HCL 50 MG PO TABS: 50.0000 mg | ORAL_TABLET | Freq: Four times a day (QID) | ORAL | 0 refills | Status: AC | PRN

## 2022-01-04 MED ORDER — PREDNISONE 20 MG PO TABS: ORAL_TABLET | ORAL | 0 refills | Status: AC

## 2022-01-04 MED ORDER — CYCLOBENZAPRINE HCL 5 MG PO TABS: 5.0000 mg | ORAL_TABLET | Freq: Three times a day (TID) | ORAL | 0 refills | Status: DC | PRN

## 2022-01-04 NOTE — Progress Notes (Signed)
PROVIDER NOTE: Information contained herein reflects review and annotations entered in association with encounter. Interpretation of such information and data should be left to medically-trained personnel. Information provided to patient can be located elsewhere in the medical record under "Patient Instructions". Document created using STT-dictation technology, any transcriptional errors that may result from process are unintentional.    Patient: Erica Walters  Service Category: E/M  Provider: Gillis Santa, MD  DOB: May 10, 1966  DOS: 01/04/2022  Specialty: Interventional Pain Management  MRN: [id number]  Setting: Ambulatory outpatient  PCP: Susy Frizzle, MD  Type: Established Patient    Referring Provider: Susy Frizzle, MD  Location: Office  Delivery: Face-to-face     HPI  Ms. Erica Walters, a 56 y.o. year old female, is here today because of her Chronic radicular lumbar pain [M54.16, G89.29]. Erica Walters's primary complain today is Back Pain (lower) Last encounter: My last encounter with her was on 07/13/2021. Pertinent problems: Erica Walters has Cervical radiculopathy; DDD (degenerative disc disease), cervical; and Bilateral plantar fasciitis on their pertinent problem list. Pain Assessment: Severity of Chronic pain is reported as a 8 /10. Location: Back Lower, Left/into left hip then "dense" pain to outside of left thigh. Onset: More than a month ago. Quality: Hervey Ard, Shooting. Timing: Constant. Modifying factor(s): hydrocodone from primary. Vitals:  height is 5' 6.5" (1.689 m) and weight is 198 lb (89.8 kg). Her temporal temperature is 96.7 F (35.9 C) (abnormal). Her blood pressure is 136/95 (abnormal) and her pulse is 46 (abnormal). Her respiration is 16 and oxygen saturation is 97%.   Reason for encounter: Medication management and worsening low back pain.  -Patient unfortunately had a lumbar radicular pain flare that led her to the emergency department on 01/01/2022.  She describes  pain that is radiating from her low back down to her left lateral thigh in a dermatomal fashion.  She does have occasional radiation into her groin as well.  She is now ambulating with a cane and also has a walker at home to avoid any falls. -Lumbar spine x-ray showed degenerative changes at L4-L5 and L5-S1 with disc base narrowing. -We discussed following up with a lumbar MRI to evaluate for central canal, foraminal stenosis and degree of facet arthropathy. -She lost 10 pounds during her first week while on Ozempic but has been having trouble getting it approved by her insurance. -continue Lyrica at 100 mg twice a day, she was previously on 50 mg every morning, 100 mg nightly -Continue nortriptyline, continue 50 mg nightly -Short course of as needed tramadol to be taken as needed for breakthrough radicular pain. -Steroid taper with prednisone as below   ROS  Constitutional: Denies any fever or chills Gastrointestinal: No reported hemesis, hematochezia, vomiting, or acute GI distress Musculoskeletal:  Low back pain with radiation into left lateral thigh in dermatomal fashion, occasional left groin radiation Neurological: No reported episodes of acute onset apraxia, aphasia, dysarthria, agnosia, amnesia, paralysis, loss of coordination, or loss of consciousness  Medication Review  Semaglutide (1 MG/DOSE), celecoxib, cyclobenzaprine, nortriptyline, predniSONE, pregabalin, and traMADol  History Review  Allergy: Erica Walters has No Known Allergies. Drug: Erica Walters  reports no history of drug use. Alcohol:  reports current alcohol use of about 14.0 standard drinks of alcohol per week. Tobacco:  reports that she has been smoking cigarettes. She has a 13.00 pack-year smoking history. She has never used smokeless tobacco. Social: Erica Walters  reports that she has been smoking cigarettes. She has a 13.00 pack-year smoking history.  She has never used smokeless tobacco. She reports current alcohol  use of about 14.0 standard drinks of alcohol per week. She reports that she does not use drugs. Medical:  has a past medical history of Allergy, Arthritis, and Hyperlipidemia. Surgical: Erica Walters  has a past surgical history that includes Total abdominal hysterectomy w/ bilateral salpingoophorectomy and Colonoscopy (12/04/2017). Family: family history includes Colon cancer in her paternal grandfather.  Laboratory Chemistry Profile   Renal Lab Results  Component Value Date   BUN 11 01/23/2019   CREATININE 0.81 49/70/2637   BCR NOT APPLICABLE 85/88/5027   GFRAA 97 01/23/2019   GFRNONAA 84 01/23/2019    Hepatic Lab Results  Component Value Date   AST 44 (H) 01/23/2019   ALT 62 (H) 01/23/2019   ALBUMIN 4.1 07/19/2015   ALKPHOS 83 07/19/2015   LIPASE 30 07/19/2015    Electrolytes Lab Results  Component Value Date   NA 141 01/23/2019   K 4.8 01/23/2019   CL 106 01/23/2019   CALCIUM 9.7 01/23/2019    Bone Lab Results  Component Value Date   VD25OH 31 01/23/2019    Inflammation (CRP: Acute Phase) (ESR: Chronic Phase) Lab Results  Component Value Date   CRP 4.7 12/25/2018   ESRSEDRATE 14 12/25/2018         Note: Above Lab results reviewed.  Recent Imaging Review  DG Lumbar Spine Complete W/Bend CLINICAL DATA:  Chronic low back pain for years  EXAM: LUMBAR SPINE - COMPLETE WITH BENDING VIEWS  COMPARISON:  Lumbar MRI 04/11/2019  FINDINGS: Five lumbar type vertebrae. Generalized disc space narrowing with mild endplate ridging, height loss greatest at L5-S1. Degenerative facet spurring mainly at L4-5 and L5-S1. Flexion extension views show limited range of motion. No dynamic listhesis. No fracture deformity or evidence of bone lesion. Atheromatous calcification of the aorta.  IMPRESSION: 1. Generalized disc space narrowing and lower lumbar degenerative facet spurring. 2. Limited movement with flexion and extension. No dynamic listhesis.  Electronically  Signed   By: Jorje Guild M.D.   On: 10/06/2021 10:39 Note: Reviewed        Physical Exam  General appearance: Well nourished, well developed, and well hydrated. In no apparent acute distress Mental status: Alert, oriented x 3 (person, place, & time)       Respiratory: No evidence of acute respiratory distress Eyes: PERLA Vitals: BP (!) 136/95   Pulse (!) 46   Temp (!) 96.7 F (35.9 C) (Temporal)   Resp 16   Ht 5' 6.5" (1.689 m)   Wt 198 lb (89.8 kg)   SpO2 97%   BMI 31.48 kg/m  BMI: Estimated body mass index is 31.48 kg/m as calculated from the following:   Height as of this encounter: 5' 6.5" (1.689 m).   Weight as of this encounter: 198 lb (89.8 kg). Ideal: Ideal body weight: 60.5 kg (133 lb 4.3 oz) Adjusted ideal body weight: 72.2 kg (159 lb 2.6 oz)  Lumbar Spine Area Exam  Skin & Axial Inspection: No masses, redness, or swelling Alignment: Symmetrical Functional ROM: Pain restricted ROM       Stability: No instability detected Muscle Tone/Strength: Functionally intact. No obvious neuro-muscular anomalies detected. Sensory (Neurological): Dermatomal pain pattern left L4-L5 Palpation: No palpable anomalies       Provocative Tests: Positive straight leg raise test on the left Gait & Posture Assessment  Ambulation: Patient ambulates using a cane Gait: Antalgic gait (limping) Posture: Difficulty standing up straight, due to pain  Lower  Extremity Exam    Side: Right lower extremity  Side: Left lower extremity  Stability: No instability observed          Stability: No instability observed          Skin & Extremity Inspection: Skin color, temperature, and hair growth are WNL. No peripheral edema or cyanosis. No masses, redness, swelling, asymmetry, or associated skin lesions. No contractures.  Skin & Extremity Inspection: Skin color, temperature, and hair growth are WNL. No peripheral edema or cyanosis. No masses, redness, swelling, asymmetry, or associated skin lesions.  No contractures.  Functional ROM: Unrestricted ROM                  Functional ROM: Pain restricted ROM for all joints of the lower extremity          Muscle Tone/Strength: Functionally intact. No obvious neuro-muscular anomalies detected.  Muscle Tone/Strength: Functionally intact. No obvious neuro-muscular anomalies detected.  Sensory (Neurological): Unimpaired        Sensory (Neurological): Dermatomal pain pattern        DTR: Patellar: deferred today Achilles: deferred today Plantar: deferred today  DTR: Patellar: deferred today Achilles: deferred today Plantar: deferred today  Palpation: No palpable anomalies  Palpation: No palpable anomalies    Assessment   Diagnosis Status  1. Chronic radicular lumbar pain (left)   2. Lumbar facet arthropathy   3. Lumbar degenerative disc disease   4. Neuropathy involving both lower extremities   5. Chronic pain syndrome     Having a Flare-up Persistent Persistent   Plan of Care    Ms. Aracelly Tencza has a current medication list which includes the following long-term medication(s): nortriptyline and pregabalin.  1. Chronic radicular lumbar pain (left) - cyclobenzaprine (FLEXERIL) 5 MG tablet; Take 1 tablet (5 mg total) by mouth 3 (three) times daily as needed for muscle spasms.  Dispense: 90 tablet; Refill: 0 - MR LUMBAR SPINE WO CONTRAST; Future - predniSONE (DELTASONE) 20 MG tablet; Take 3 tablets (60 mg total) by mouth daily with breakfast for 3 days, THEN 2 tablets (40 mg total) daily with breakfast for 3 days, THEN 1 tablet (20 mg total) daily with breakfast for 3 days.  Dispense: 18 tablet; Refill: 0 - traMADol (ULTRAM) 50 MG tablet; Take 1 tablet (50 mg total) by mouth every 6 (six) hours as needed for up to 15 days for severe pain.  Dispense: 60 tablet; Refill: 0  2. Lumbar facet arthropathy - MR LUMBAR SPINE WO CONTRAST; Future - predniSONE (DELTASONE) 20 MG tablet; Take 3 tablets (60 mg total) by mouth daily with breakfast  for 3 days, THEN 2 tablets (40 mg total) daily with breakfast for 3 days, THEN 1 tablet (20 mg total) daily with breakfast for 3 days.  Dispense: 18 tablet; Refill: 0 - traMADol (ULTRAM) 50 MG tablet; Take 1 tablet (50 mg total) by mouth every 6 (six) hours as needed for up to 15 days for severe pain.  Dispense: 60 tablet; Refill: 0  3. Lumbar degenerative disc disease -Continue with home PT exercises  4. Neuropathy involving both lower extremities -Lyrica, Nortriptyline - MR LUMBAR SPINE WO CONTRAST; Future - predniSONE (DELTASONE) 20 MG tablet; Take 3 tablets (60 mg total) by mouth daily with breakfast for 3 days, THEN 2 tablets (40 mg total) daily with breakfast for 3 days, THEN 1 tablet (20 mg total) daily with breakfast for 3 days.  Dispense: 18 tablet; Refill: 0 - traMADol (ULTRAM) 50 MG tablet; Take 1  tablet (50 mg total) by mouth every 6 (six) hours as needed for up to 15 days for severe pain.  Dispense: 60 tablet; Refill: 0  5. Chronic pain syndrome --Lyrica, Nortriptyline - cyclobenzaprine (FLEXERIL) 5 MG tablet; Take 1 tablet (5 mg total) by mouth 3 (three) times daily as needed for muscle spasms.  Dispense: 90 tablet; Refill: 0 - MR LUMBAR SPINE WO CONTRAST; Future - predniSONE (DELTASONE) 20 MG tablet; Take 3 tablets (60 mg total) by mouth daily with breakfast for 3 days, THEN 2 tablets (40 mg total) daily with breakfast for 3 days, THEN 1 tablet (20 mg total) daily with breakfast for 3 days.  Dispense: 18 tablet; Refill: 0 - traMADol (ULTRAM) 50 MG tablet; Take 1 tablet (50 mg total) by mouth every 6 (six) hours as needed for up to 15 days for severe pain.  Dispense: 60 tablet; Refill: 0    Pharmacotherapy (Medications Ordered): Meds ordered this encounter  Medications   cyclobenzaprine (FLEXERIL) 5 MG tablet    Sig: Take 1 tablet (5 mg total) by mouth 3 (three) times daily as needed for muscle spasms.    Dispense:  90 tablet    Refill:  0    Do not place this medication, or  any other prescription from our practice, on "Automatic Refill". Patient may have prescription filled one day early if pharmacy is closed on scheduled refill date.   predniSONE (DELTASONE) 20 MG tablet    Sig: Take 3 tablets (60 mg total) by mouth daily with breakfast for 3 days, THEN 2 tablets (40 mg total) daily with breakfast for 3 days, THEN 1 tablet (20 mg total) daily with breakfast for 3 days.    Dispense:  18 tablet    Refill:  0   traMADol (ULTRAM) 50 MG tablet    Sig: Take 1 tablet (50 mg total) by mouth every 6 (six) hours as needed for up to 15 days for severe pain.    Dispense:  60 tablet    Refill:  0    Fill one day early if pharmacy is closed on scheduled refill date.   Orders:  Orders Placed This Encounter  Procedures   MR LUMBAR SPINE WO CONTRAST    Patient presents with axial pain with possible radicular component. Please assist [name] in identifying specific level(s) and laterality of any additional findings such as: 1. Facet (Zygapophyseal) joint DJD (Hypertrophy, space narrowing, subchondral sclerosis, and/or osteophyte formation) 2. DDD and/or IVDD (Loss of disc height, desiccation, gas patterns, osteophytes, endplate sclerosis, or "Black disc disease") 3. Pars defects 4. Spondylolisthesis, spondylosis, and/or spondyloarthropathies (include Degree/Grade of displacement in mm) (stability) 5. Vertebral body Fractures (acute/chronic) (state percentage of collapse) 6. Demineralization (osteopenia/osteoporotic) 7. Bone pathology 8. Foraminal narrowing  9. Surgical changes 10. Central, Lateral Recess, and/or Foraminal Stenosis (include AP diameter of stenosis in mm) 11. Surgical changes (hardware type, status, and presence of fibrosis) 12. Modic Type Changes (MRI only) 13. IVDD (Disc bulge, protrusion, herniation, extrusion) (Level, laterality, extent)    Standing Status:   Future    Standing Expiration Date:   02/04/2022    Scheduling Instructions:     Imaging must be  done as soon as possible. Inform patient that order will expire within 30 days and I will not renew it.    Order Specific Question:   What is the patient's sedation requirement?    Answer:   No Sedation    Order Specific Question:   Does the patient have  a pacemaker or implanted devices?    Answer:   No    Order Specific Question:   Preferred imaging location?    Answer:   [facility] (table limit - 500 lbs)    Order Specific Question:   Call Results- Best Contact Number?    Answer:   [phone] ([facility])    Order Specific Question:   Radiology Contrast Protocol - do NOT remove file path    Answer:   [email or web address]   Follow-up plan:   Return for i will call pt to schedule F2F visit to review L-MRI results.    Recent Visits No visits were found meeting these conditions. Showing recent visits within past 90 days and meeting all other requirements Today's Visits Date Type Provider Dept  01/04/22 Office Visit Gillis Santa, MD [facility]  Showing today's visits and meeting all other requirements Future Appointments No visits were found meeting these conditions. Showing future appointments within next 90 days and meeting all other requirements  I discussed the assessment and treatment plan with the patient. The patient was provided an opportunity to ask questions and all were answered. The patient agreed with the plan and demonstrated an understanding of the instructions.  Patient advised to call back or seek an in-person evaluation if the symptoms or condition worsens.  Duration of encounter: 30 minutes.  Note by: Gillis Santa, MD Date: 01/04/2022; Time: 10:41 AM

## 2022-01-04 NOTE — Progress Notes (Signed)
Safety precautions to be maintained throughout the outpatient stay will include: orient to surroundings, keep bed in low position, maintain call bell within reach at all times, provide assistance with transfer out of bed and ambulation.  

## 2022-01-15 ENCOUNTER — Other Ambulatory Visit: Payer: Self-pay | Admitting: Student in an Organized Health Care Education/Training Program

## 2022-01-15 ENCOUNTER — Telehealth: Payer: Self-pay

## 2022-01-15 DIAGNOSIS — G5793 Unspecified mononeuropathy of bilateral lower limbs: Secondary | ICD-10-CM

## 2022-01-15 MED ORDER — PREGABALIN 100 MG PO CAPS: 100.0000 mg | ORAL_CAPSULE | Freq: Two times a day (BID) | ORAL | 5 refills | Status: DC

## 2022-01-15 NOTE — Telephone Encounter (Signed)
Request to change to '100mg'$  strength sent to [facility], [place].

## 2022-01-15 NOTE — Telephone Encounter (Signed)
The insurance company is not giving her the whole script. They are only giving her 3 a day vs the 4 a day Dr. Holley Raring ordered. She wants to know if he can just go up on the dosage to 100 mg in one pill instead. She wants a nurse to call her back to let her know if he will do this.

## 2022-01-16 ENCOUNTER — Other Ambulatory Visit: Payer: 59

## 2022-01-20 ENCOUNTER — Ambulatory Visit
Admission: RE | Admit: 2022-01-20 | Discharge: 2022-01-20 | Disposition: A | Discharge status: Home / Self Care | Payer: 59 | Source: Ambulatory Visit | Attending: Student in an Organized Health Care Education/Training Program | Admitting: Student in an Organized Health Care Education/Training Program

## 2022-01-20 DIAGNOSIS — M47816 Spondylosis without myelopathy or radiculopathy, lumbar region: Secondary | ICD-10-CM

## 2022-01-20 DIAGNOSIS — G5793 Unspecified mononeuropathy of bilateral lower limbs: Secondary | ICD-10-CM

## 2022-01-20 DIAGNOSIS — G894 Chronic pain syndrome: Secondary | ICD-10-CM

## 2022-01-20 DIAGNOSIS — G8929 Other chronic pain: Secondary | ICD-10-CM

## 2022-01-22 ENCOUNTER — Other Ambulatory Visit: Payer: Self-pay

## 2022-01-22 ENCOUNTER — Ambulatory Visit
Payer: 59 | Attending: Student in an Organized Health Care Education/Training Program | Admitting: Student in an Organized Health Care Education/Training Program

## 2022-01-22 ENCOUNTER — Encounter: Payer: Self-pay | Admitting: Student in an Organized Health Care Education/Training Program

## 2022-01-22 VITALS — BP 128/109 | HR 97 | Temp 97.1°F | Resp 16 | Ht 66.5 in | Wt 198.0 lb

## 2022-01-22 DIAGNOSIS — M5116 Intervertebral disc disorders with radiculopathy, lumbar region: Secondary | ICD-10-CM | POA: Diagnosis present

## 2022-01-22 DIAGNOSIS — M47816 Spondylosis without myelopathy or radiculopathy, lumbar region: Secondary | ICD-10-CM | POA: Insufficient documentation

## 2022-01-22 DIAGNOSIS — G5793 Unspecified mononeuropathy of bilateral lower limbs: Secondary | ICD-10-CM | POA: Diagnosis present

## 2022-01-22 DIAGNOSIS — G894 Chronic pain syndrome: Secondary | ICD-10-CM | POA: Diagnosis present

## 2022-01-22 DIAGNOSIS — Z0289 Encounter for other administrative examinations: Secondary | ICD-10-CM | POA: Diagnosis present

## 2022-01-22 DIAGNOSIS — M5136 Other intervertebral disc degeneration, lumbar region: Secondary | ICD-10-CM | POA: Diagnosis present

## 2022-01-22 MED ORDER — TRAMADOL HCL 50 MG PO TABS: 50.0000 mg | ORAL_TABLET | Freq: Three times a day (TID) | ORAL | 2 refills | Status: DC | PRN

## 2022-01-22 NOTE — Progress Notes (Deleted)
Safety precautions to be maintained throughout the outpatient stay will include: orient to surroundings, keep bed in low position, maintain call bell within reach at all times, provide assistance with transfer out of bed and ambulation.  

## 2022-01-22 NOTE — Progress Notes (Signed)
Nursing Pain Medication Assessment:  Safety precautions to be maintained throughout the outpatient stay will include: orient to surroundings, keep bed in low position, maintain call bell within reach at all times, provide assistance with transfer out of bed and ambulation.  Medication Inspection Compliance: Erica Walters did not comply with our request to bring her pills to be counted. She was reminded that bringing the medication bottles, even when empty, is a requirement.  Medication: None brought in. Pill/Patch Count: None available to be counted. Bottle Appearance: No container available. Did not bring bottle(s) to appointment. Filled Date: N/A Last Medication intake:  Today

## 2022-01-22 NOTE — Patient Instructions (Signed)

## 2022-01-22 NOTE — Progress Notes (Addendum)
PROVIDER NOTE: Information contained herein reflects review and annotations entered in association with encounter. Interpretation of such information and data should be left to medically-trained personnel. Information provided to patient can be located elsewhere in the medical record under "Patient Instructions". Document created using STT-dictation technology, any transcriptional errors that may result from process are unintentional.    Patient: Erica Walters  Service Category: E/M  Provider: Gillis Santa, MD  DOB: [birth year]  DOS: 01/22/2022  Referring Provider: Susy Frizzle, MD  MRN: [id number]  Specialty: Interventional Pain Management  PCP: Susy Frizzle, MD  Type: Established Patient  Setting: Ambulatory outpatient    Location: Office  Delivery: Face-to-face     HPI  Erica Walters, a 56 y.o. year old female, is here today because of her Lumbar disc herniation with radiculopathy [M51.16]. Erica Walters's primary complain today is Back Pain (Lumbar more on the left ) Last encounter: My last encounter with her was on 01/04/2022. Pertinent problems: Erica Walters has Cervical radiculopathy; DDD (degenerative disc disease), cervical; and Bilateral plantar fasciitis on their pertinent problem list. Pain Assessment: Severity of Chronic pain is reported as a 4 /10. Location: Back Lower, Left, Right/into left hip, groin, and left outer thigh. Onset: More than a month ago. Quality: Discomfort, Constant, Sharp, Heaviness. Timing: Constant. Modifying factor(s): medications takes the edge off.. Vitals:  height is 5' 6.5" (1.689 m) and weight is 198 lb (89.8 kg). Her temporal temperature is 97.1 F (36.2 C) (abnormal). Her blood pressure is 128/109 (abnormal) and her pulse is 97. Her respiration is 16 and oxygen saturation is 98%.   Reason for encounter: Review lumbar MRI results and discuss treatment plan.  Also medication management for Tramadol as pharmacy only filled 7 day supply.  PMP reviewed  and appropriate.  ROS  Constitutional: Denies any fever or chills Gastrointestinal: No reported hemesis, hematochezia, vomiting, or acute GI distress Musculoskeletal:  LBP; L>R Neurological: No reported episodes of acute onset apraxia, aphasia, dysarthria, agnosia, amnesia, paralysis, loss of coordination, or loss of consciousness  Medication Review  Semaglutide (1 MG/DOSE), cyclobenzaprine, nortriptyline, pregabalin, and traMADol  History Review  Allergy: Erica Walters has No Known Allergies. Drug: Erica Walters  reports no history of drug use. Alcohol:  reports current alcohol use of about 14.0 standard drinks of alcohol per week. Tobacco:  reports that she has been smoking cigarettes. She has a 13.00 pack-year smoking history. She has never used smokeless tobacco. Social: Erica Walters  reports that she has been smoking cigarettes. She has a 13.00 pack-year smoking history. She has never used smokeless tobacco. She reports current alcohol use of about 14.0 standard drinks of alcohol per week. She reports that she does not use drugs. Medical:  has a past medical history of Allergy, Arthritis, and Hyperlipidemia. Surgical: Erica Walters  has a past surgical history that includes Total abdominal hysterectomy w/ bilateral salpingoophorectomy and Colonoscopy (12/04/2017). Family: family history includes Colon cancer in her paternal grandfather.  Laboratory Chemistry Profile   Renal Lab Results  Component Value Date   BUN 11 01/23/2019   CREATININE 0.81 83/77/9396   BCR NOT APPLICABLE 88/64/8472   GFRAA 97 01/23/2019   GFRNONAA 84 01/23/2019    Hepatic Lab Results  Component Value Date   AST 44 (H) 01/23/2019   ALT 62 (H) 01/23/2019   ALBUMIN 4.1 07/19/2015   ALKPHOS 83 07/19/2015   LIPASE 30 07/19/2015    Electrolytes Lab Results  Component Value Date   NA 141 01/23/2019  K 4.8 01/23/2019   CL 106 01/23/2019   CALCIUM 9.7 01/23/2019    Bone Lab Results  Component Value  Date   VD25OH 31 01/23/2019    Inflammation (CRP: Acute Phase) (ESR: Chronic Phase) Lab Results  Component Value Date   CRP 4.7 12/25/2018   ESRSEDRATE 14 12/25/2018         Note: Above Lab results reviewed.  Recent Imaging Review  MR LUMBAR SPINE WO CONTRAST CLINICAL DATA:  Initial evaluation for low back pain with left-sided radicular symptoms.  EXAM: MRI LUMBAR SPINE WITHOUT CONTRAST  TECHNIQUE: Multiplanar, multisequence MR imaging of the lumbar spine was performed. No intravenous contrast was administered.  COMPARISON:  Prior MRI from 04/11/2019.  FINDINGS: Segmentation: Standard. Lowest well-formed disc space labeled the L5-S1 level.  Alignment: Trace degenerative anterolisthesis of L5 on S1. Alignment otherwise normal with preservation of the normal lumbar lordosis.  Vertebrae: Vertebral body height maintained without acute or chronic fracture. Bone marrow signal intensity diffusely heterogeneous with multiple scattered benign hemangiomata. No worrisome osseous lesions. Mild reactive edema present about the left L5-S1 facet due to facet arthritis. No other abnormal marrow edema.  Conus medullaris and cauda equina: Conus extends to the T12-L1 level. Conus and cauda equina appear normal.  Paraspinal and other soft tissues: Paraspinous soft tissues within normal limits. Subcentimeter simple right renal cyst noted, benign in appearance, no follow-up imaging recommended. Visualized visceral structures otherwise unremarkable.  Disc levels:  T11-12: Seen only on sagittal projection. Disc bulge with mild disc desiccation. No stenosis.  T12-L1: Mild disc bulge with disc desiccation.  No stenosis.  L1-2: Diffuse disc bulge with disc desiccation. Superimposed tiny left subarticular disc extrusion with inferior migration (series 6, images 9, 10, 11). The descending left L2 nerve root could potentially be affected. No significant canal or lateral recess stenosis.  Foramina remain patent.  L2-3: Disc desiccation with mild annular disc bulge. Mild facet hypertrophy. No stenosis.  L3-4: Disc desiccation with mild diffuse disc bulge. Reactive endplate spurring. Superimposed broad right foraminal to extraforaminal disc protrusion (series 7, image 22), potentially affecting the exiting right L3 nerve root. Mild bilateral facet hypertrophy. Resultant mild narrowing of the lateral recesses bilaterally. Central canal remains patent. Mild bilateral L3 foraminal stenosis.  L4-5: Disc desiccation with mild disc bulge. Mild to moderate facet hypertrophy. No significant spinal stenosis. Foramina remain patent.  L5-S1: Degenerative intervertebral disc space narrowing with disc desiccation and diffuse disc bulge. Mild reactive endplate spurring. Moderate left worse than right facet arthrosis. No significant spinal stenosis. Mild left L5 foraminal narrowing. Right neural foramen remains patent.  IMPRESSION: 1. Tiny left subarticular disc extrusion with inferior migration at L1-2, potentially affecting the descending left L2 nerve root. 2. Broad right foraminal to extraforaminal disc protrusion at L3-4, potentially affecting the exiting right L3 nerve root. 3. Mild to moderate bilateral facet hypertrophy at L2-3 through L5-S1, most pronounced at L5-S1 on the left. Finding could serve as a source for lower back pain. 4. Additional mild noncompressive disc bulging elsewhere throughout the lumbar spine as above. No other significant stenosis or neural impingement.  Electronically Signed   By: Jeannine Boga M.D.   On: 01/21/2022 01:43 Note: Reviewed        Physical Exam  General appearance: Well nourished, well developed, and well hydrated. In no apparent acute distress Mental status: Alert, oriented x 3 (person, place, & time)       Respiratory: No evidence of acute respiratory distress Eyes: PERLA Vitals: BP Marland Kitchen)  128/109 (BP Location: Right Arm,  Patient Position: Sitting, Cuff Size: Large)   Pulse 97   Temp (!) 97.1 F (36.2 C) (Temporal)   Resp 16   Ht 5' 6.5" (1.689 m)   Wt 198 lb (89.8 kg)   SpO2 98%   BMI 31.48 kg/m  BMI: Estimated body mass index is 31.48 kg/m as calculated from the following:   Height as of this encounter: 5' 6.5" (1.689 m).   Weight as of this encounter: 198 lb (89.8 kg). Ideal: Ideal body weight: 60.5 kg (133 lb 4.3 oz) Adjusted ideal body weight: 72.2 kg (159 lb 2.6 oz)  Lumbar Spine Area Exam  Skin & Axial Inspection: No masses, redness, or swelling Alignment: Symmetrical Functional ROM: Pain restricted ROM       Stability: No instability detected Muscle Tone/Strength: Functionally intact. No obvious neuro-muscular anomalies detected. Sensory (Neurological): Dermatomal pain pattern left L4-L5 Palpation: No palpable anomalies       Provocative Tests: Positive straight leg raise test on the left Gait & Posture Assessment  Ambulation: Patient ambulates using a cane Gait: Antalgic gait (limping) Posture: Difficulty standing up straight, due to pain  Lower Extremity Exam      Side: Right lower extremity   Side: Left lower extremity  Stability: No instability observed           Stability: No instability observed          Skin & Extremity Inspection: Skin color, temperature, and hair growth are WNL. No peripheral edema or cyanosis. No masses, redness, swelling, asymmetry, or associated skin lesions. No contractures.   Skin & Extremity Inspection: Skin color, temperature, and hair growth are WNL. No peripheral edema or cyanosis. No masses, redness, swelling, asymmetry, or associated skin lesions. No contractures.  Functional ROM: Unrestricted ROM                   Functional ROM: Pain restricted ROM for all joints of the lower extremity          Muscle Tone/Strength: Functionally intact. No obvious neuro-muscular anomalies detected.   Muscle Tone/Strength: Functionally intact. No obvious  neuro-muscular anomalies detected.  Sensory (Neurological): Unimpaired         Sensory (Neurological): Dermatomal pain pattern        DTR: Patellar: deferred today Achilles: deferred today Plantar: deferred today   DTR: Patellar: deferred today Achilles: deferred today Plantar: deferred today  Palpation: No palpable anomalies   Palpation: No palpable anomalies       Assessment   Diagnosis Status  1. Lumbar disc herniation with radiculopathy   2. Neuropathy involving both lower extremities   3. Lumbar facet arthropathy   4. Lumbar degenerative disc disease   5. Chronic pain syndrome   6. Pain management contract signed    Having a Flare-up Having a Flare-up Persistent   Updated Problems: Problem  Lumbar Disc Herniation With Radiculopathy    Plan of Care    Ms. Jaquelynn Wanamaker has a current medication list which includes the following long-term medication(s): nortriptyline and pregabalin.  1. Lumbar disc herniation with radiculopathy - Lumbar Epidural Injection; Future  2. Neuropathy involving both lower extremities - Lumbar Epidural Injection; Future -Continue with Lyrica  3. Lumbar facet arthropathy -Nakeeta Sebastiani has a history of greater than 3 months of moderate to severe pain which is resulted in functional impairment.  The patient has tried various conservative therapeutic options such as NSAIDs, Tylenol, muscle relaxants, physical therapy which was inadequately effective.  Patient's pain is predominantly axial with physical exam findings suggestive of facet arthropathy. Lumbar facet medial branch nerve blocks were discussed with the patient.  Risks and benefits were reviewed.  We will consider bilateral L3, L4, L5 medial branch nerve block.   4. Lumbar degenerative disc disease -PT  5. Chronic pain syndrome -Continue with Lyrica, nortriptyline, Lexapro as prescribed, no refills needed - traMADol (ULTRAM) 50 MG tablet; Take 1 tablet (50 mg total) by mouth  every 8 (eight) hours as needed for moderate pain.  Dispense: 90 tablet; Refill: 2 - [facility] - Lumbar Epidural Injection; Future  6. Pain management contract signed - [facility], [facility]   Pharmacotherapy (Medications Ordered): Meds ordered this encounter  Medications   traMADol (ULTRAM) 50 MG tablet    Sig: Take 1 tablet (50 mg total) by mouth every 8 (eight) hours as needed for moderate pain.    Dispense:  90 tablet    Refill:  2   Orders:  Orders Placed This Encounter  Procedures   Lumbar Epidural Injection    Standing Status:   Future    Standing Expiration Date:   02/22/2022    Scheduling Instructions:     Procedure: Interlaminar Lumbar Epidural Steroid injection (LESI)            LEFT L1/2 ESI    Order Specific Question:   Where will this procedure be performed?    Answer:   [facility]   [facility], [facility]    Volume: 30 ml(s). Minimum 3 ml of urine is needed. Document temperature of fresh sample. Indications: Long term (current) use of opiate analgesic (W41.324) Test#: [id number] (Comprehensive Profile)    Order Specific Question:   Release to patient    Answer:   Immediate   Follow-up plan:   Return in about 1 week (around 01/29/2022) for left L1/2 ESI , in clinic [facility].      Recent Visits Date Type Provider Dept  01/04/22 Office Visit Gillis Santa, MD [facility]  Showing recent visits within past 90 days and meeting all other requirements Today's Visits Date Type Provider Dept  01/22/22 Office Visit Gillis Santa, MD [facility]  Showing today's visits and meeting all other requirements Future Appointments Date Type Provider Dept  03/29/22 Appointment Gillis Santa, MD [facility]  Showing future appointments within next 90 days and meeting all other requirements  I discussed the assessment and treatment plan with the patient. The patient was provided an opportunity to ask questions and  all were answered. The patient agreed with the plan and demonstrated an understanding of the instructions.  Patient advised to call back or seek an in-person evaluation if the symptoms or condition worsens.  Duration of encounter: 38mnutes.  Total time on encounter, as per AMA guidelines included both the face-to-face and non-face-to-face time personally spent by the physician and/or other qualified health care professional(s) on the day of the encounter (includes time in activities that require the physician or other qualified health care professional and does not include time in activities normally performed by clinical staff). Physician's time may include the following activities when performed: preparing to see the patient (eg, review of tests, pre-charting review of records) obtaining and/or reviewing separately obtained history performing a medically appropriate examination and/or evaluation counseling and educating the patient/family/caregiver ordering medications, tests, or procedures referring and communicating with other health care professionals (when not separately reported) documenting clinical information in the electronic or other  health record independently interpreting results (not separately reported) and communicating results to the patient/ family/caregiver care coordination (not separately reported)  Note by: Gillis Santa, MD Date: 01/22/2022; Time: 2:31 PM

## 2022-01-26 LAB — COMPLIANCE DRUG ANALYSIS, UR

## 2022-01-31 ENCOUNTER — Encounter: Payer: Self-pay | Admitting: Student in an Organized Health Care Education/Training Program

## 2022-02-12 ENCOUNTER — Ambulatory Visit
Admission: RE | Admit: 2022-02-12 | Discharge: 2022-02-12 | Disposition: A | Discharge status: Home / Self Care | Payer: 59 | Source: Ambulatory Visit | Attending: Student in an Organized Health Care Education/Training Program | Admitting: Student in an Organized Health Care Education/Training Program

## 2022-02-12 ENCOUNTER — Encounter: Payer: Self-pay | Admitting: Student in an Organized Health Care Education/Training Program

## 2022-02-12 ENCOUNTER — Ambulatory Visit
Payer: 59 | Attending: Student in an Organized Health Care Education/Training Program | Admitting: Student in an Organized Health Care Education/Training Program

## 2022-02-12 DIAGNOSIS — G894 Chronic pain syndrome: Secondary | ICD-10-CM | POA: Diagnosis not present

## 2022-02-12 DIAGNOSIS — M5116 Intervertebral disc disorders with radiculopathy, lumbar region: Secondary | ICD-10-CM

## 2022-02-12 DIAGNOSIS — G629 Polyneuropathy, unspecified: Secondary | ICD-10-CM | POA: Diagnosis present

## 2022-02-12 DIAGNOSIS — G5793 Unspecified mononeuropathy of bilateral lower limbs: Secondary | ICD-10-CM

## 2022-02-12 DIAGNOSIS — Z01810 Encounter for preprocedural cardiovascular examination: Secondary | ICD-10-CM | POA: Diagnosis not present

## 2022-02-12 MED ORDER — IOHEXOL 180 MG/ML  SOLN
10.0000 mL | Freq: Once | INTRAMUSCULAR | Status: AC
Start: 2022-02-12 — End: 2022-02-12
  Administered 2022-02-12: 10 mL via EPIDURAL
  Filled 2022-02-12: qty 20

## 2022-02-12 MED ORDER — SODIUM CHLORIDE (PF) 0.9 % IJ SOLN
INTRAMUSCULAR | Status: AC
  Filled 2022-02-12: qty 10

## 2022-02-12 MED ORDER — LIDOCAINE HCL 2 % IJ SOLN
20.0000 mL | Freq: Once | INTRAMUSCULAR | Status: AC
  Administered 2022-02-12: 400 mg
  Filled 2022-02-12: qty 20

## 2022-02-12 MED ORDER — SODIUM CHLORIDE 0.9% FLUSH
2.0000 mL | Freq: Once | INTRAVENOUS | Status: AC
Start: 2022-02-12 — End: 2022-02-12
  Administered 2022-02-12: 2 mL

## 2022-02-12 MED ORDER — DEXAMETHASONE SODIUM PHOSPHATE 10 MG/ML IJ SOLN
10.0000 mg | Freq: Once | INTRAMUSCULAR | Status: AC
Start: 2022-02-12 — End: 2022-02-12
  Administered 2022-02-12: 10 mg
  Filled 2022-02-12: qty 1

## 2022-02-12 MED ORDER — ROPIVACAINE HCL 2 MG/ML IJ SOLN
2.0000 mL | Freq: Once | INTRAMUSCULAR | Status: AC
Start: 2022-02-12 — End: 2022-02-12
  Administered 2022-02-12: 2 mL via EPIDURAL
  Filled 2022-02-12: qty 20

## 2022-02-12 NOTE — Patient Instructions (Signed)
Pain Management Discharge Instructions  General Discharge Instructions :  If you need to reach your doctor call: Monday-Friday 8:00 am - 4:00 pm at [phone] or toll free [phone].  After clinic hours [phone] to have operator reach doctor.  Bring all of your medication bottles to all your appointments in the pain clinic.  To cancel or reschedule your appointment with Pain Management please remember to call 24 hours in advance to avoid a fee.  Refer to the educational materials which you have been given on: General Risks, I had my Procedure. Discharge Instructions, Post Sedation.  Post Procedure Instructions:  The drugs you were given will stay in your system until tomorrow, so for the next 24 hours you should not drive, make any legal decisions or drink any alcoholic beverages.  You may eat anything you prefer, but it is better to start with liquids then soups and crackers, and gradually work up to solid foods.  Please notify your doctor immediately if you have any unusual bleeding, trouble breathing or pain that is not related to your normal pain.  Depending on the type of procedure that was done, some parts of your body may feel week and/or numb.  This usually clears up by tonight or the next day.  Walk with the use of an assistive device or accompanied by an adult for the 24 hours.  You may use ice on the affected area for the first 24 hours.  Put ice in a Ziploc bag and cover with a towel and place against area 15 minutes on 15 minutes off.  You may switch to heat after 24 hours.Epidural Steroid Injection Patient Information  Description: The epidural space surrounds the nerves as they exit the spinal cord.  In some patients, the nerves can be compressed and inflamed by a bulging disc or a tight spinal canal (spinal stenosis).  By injecting steroids into the epidural space, we can bring irritated nerves into direct contact with a potentially helpful medication.  These  steroids act directly on the irritated nerves and can reduce swelling and inflammation which often leads to decreased pain.  Epidural steroids may be injected anywhere along the spine and from the neck to the low back depending upon the location of your pain.   After numbing the skin with local anesthetic (like Novocaine), a small needle is passed into the epidural space slowly.  You may experience a sensation of pressure while this is being done.  The entire block usually last less than 10 minutes.  Conditions which may be treated by epidural steroids:  Low back and leg pain Neck and arm pain Spinal stenosis Post-laminectomy syndrome Herpes zoster (shingles) pain Pain from compression fractures  Preparation for the injection:  Do not eat any solid food or dairy products within 8 hours of your appointment.  You may drink clear liquids up to 3 hours before appointment.  Clear liquids include water, black coffee, juice or soda.  No milk or cream please. You may take your regular medication, including pain medications, with a sip of water before your appointment  Diabetics should hold regular insulin (if taken separately) and take 1/2 normal NPH dos the morning of the procedure.  Carry some sugar containing items with you to your appointment. A driver must accompany you and be prepared to drive you home after your procedure.  Bring all your current medications with your. An IV may be inserted and sedation may be given at the discretion of the physician.     A blood pressure cuff, EKG and other monitors will often be applied during the procedure.  Some patients may need to have extra oxygen administered for a short period. You will be asked to provide medical information, including your allergies, prior to the procedure.  We must know immediately if you are taking blood thinners (like Coumadin/Warfarin)  Or if you are allergic to IV iodine contrast (dye). We must know if you could possible be  pregnant.  Possible side-effects: Bleeding from needle site Infection (rare, may require surgery) Nerve injury (rare) Numbness & tingling (temporary) Difficulty urinating (rare, temporary) Spinal headache ( a headache worse with upright posture) Light -headedness (temporary) Pain at injection site (several days) Decreased blood pressure (temporary) Weakness in arm/leg (temporary) Pressure sensation in back/neck (temporary)  Call if you experience: Fever/chills associated with headache or increased back/neck pain. Headache worsened by an upright position. New onset weakness or numbness of an extremity below the injection site Hives or difficulty breathing (go to the emergency room) Inflammation or drainage at the infection site Severe back/neck pain Any new symptoms which are concerning to you  Please note:  Although the local anesthetic injected can often make your back or neck feel good for several hours after the injection, the pain will likely return.  It takes 3-7 days for steroids to work in the epidural space.  You may not notice any pain relief for at least that one week.  If effective, we will often do a series of three injections spaced 3-6 weeks apart to maximally decrease your pain.  After the initial series, we generally will wait several months before considering a repeat injection of the same type.  If you have any questions, please call [phone] [facility] 

## 2022-02-12 NOTE — Progress Notes (Signed)
PROVIDER NOTE: Interpretation of information contained herein should be left to medically-trained personnel. Specific patient instructions are provided elsewhere under "Patient Instructions" section of medical record. This document was created in part using STT-dictation technology, any transcriptional errors that may result from this process are unintentional.  Patient: Erica Walters Type: Established DOB: 1965/12/11 MRN: [id number] PCP: Susy Frizzle, MD  Service: Procedure DOS: 02/12/2022 Setting: Ambulatory Location: Ambulatory outpatient facility Delivery: Face-to-face Provider: Gillis Santa, MD Specialty: Interventional Pain Management Specialty designation: 09 Location: Outpatient facility Ref. Prov.: Gillis Santa, MD    Primary Reason for Visit: Interventional Pain Management Treatment. CC: Back Pain (lower)   Procedure:           Type: Lumbar epidural steroid injection (LESI) (interlaminar) #1    Laterality: Left   Level:  L1-2 Level.  Imaging: Fluoroscopic guidance         Anesthesia: Local anesthesia (1-2% Lidocaine) DOS: 02/12/2022  Performed by: Gillis Santa, MD  Purpose: Diagnostic/Therapeutic Indications: Lumbar radicular pain of intraspinal etiology of more than 4 weeks that has failed to respond to conservative therapy and is severe enough to impact quality of life or function. 1. Neuropathy involving both lower extremities   2. Chronic pain syndrome   3. Lumbar disc herniation with radiculopathy    NAS-11 Pain score:   Pre-procedure: 7 /10   Post-procedure: 4 /10      Position / Prep / Materials:  Position: Prone w/ head of the table raised (slight reverse trendelenburg) to facilitate breathing.  Prep solution: DuraPrep (Iodine Povacrylex [0.7% available iodine] and Isopropyl Alcohol, 74% w/w) Prep Area: Entire Posterior Lumbar Region from lower scapular tip down to mid buttocks area and from flank to flank. Materials:  Tray: Epidural tray Needle(s):   Type: Epidural needle (Tuohy) Gauge (G):  22 Length: Regular (3.5-in) Qty: 1  Pre-op H&P Assessment:  Erica Walters is a 56 y.o. (year old), female patient, seen today for interventional treatment. She  has a past surgical history that includes Total abdominal hysterectomy w/ bilateral salpingoophorectomy and Colonoscopy (12/04/2017). Erica Walters has a current medication list which includes the following prescription(s): cyclobenzaprine, nortriptyline, pregabalin, tramadol, and semaglutide (1 mg/dose). Her primarily concern today is the Back Pain (lower)  Initial Vital Signs:  Pulse/HCG Rate: 91ECG Heart Rate: 96 Temp:  (!) 97 F (36.1 C) Resp: 16 BP: 136/81 SpO2: 97 %  BMI: Estimated body mass index is 32.28 kg/m as calculated from the following:   Height as of this encounter: '5\' 6"'$  (1.676 m).   Weight as of this encounter: 200 lb (90.7 kg).  Risk Assessment: Allergies: Reviewed. She has No Known Allergies.  Allergy Precautions: None required Coagulopathies: Reviewed. None identified.  Blood-thinner therapy: None at this time Active Infection(s): Reviewed. None identified. Erica Walters is afebrile  Site Confirmation: Erica Walters was asked to confirm the procedure and laterality before marking the site Procedure checklist: Completed Consent: Before the procedure and under the influence of no sedative(s), amnesic(s), or anxiolytics, the patient was informed of the treatment options, risks and possible complications. To fulfill our ethical and legal obligations, as recommended by the American Medical Association's Code of Ethics, I have informed the patient of my clinical impression; the nature and purpose of the treatment or procedure; the risks, benefits, and possible complications of the intervention; the alternatives, including doing nothing; the risk(s) and benefit(s) of the alternative treatment(s) or procedure(s); and the risk(s) and benefit(s) of doing nothing. The patient  was provided information about the general  risks and possible complications associated with the procedure. These may include, but are not limited to: failure to achieve desired goals, infection, bleeding, organ or nerve damage, allergic reactions, paralysis, and death. In addition, the patient was informed of those risks and complications associated to Spine-related procedures, such as failure to decrease pain; infection (i.e.: Meningitis, epidural or intraspinal abscess); bleeding (i.e.: epidural hematoma, subarachnoid hemorrhage, or any other type of intraspinal or peri-dural bleeding); organ or nerve damage (i.e.: Any type of peripheral nerve, nerve root, or spinal cord injury) with subsequent damage to sensory, motor, and/or autonomic systems, resulting in permanent pain, numbness, and/or weakness of one or several areas of the body; allergic reactions; (i.e.: anaphylactic reaction); and/or death. Furthermore, the patient was informed of those risks and complications associated with the medications. These include, but are not limited to: allergic reactions (i.e.: anaphylactic or anaphylactoid reaction(s)); adrenal axis suppression; blood sugar elevation that in diabetics may result in ketoacidosis or comma; water retention that in patients with history of congestive heart failure may result in shortness of breath, pulmonary edema, and decompensation with resultant heart failure; weight gain; swelling or edema; medication-induced neural toxicity; particulate matter embolism and blood vessel occlusion with resultant organ, and/or nervous system infarction; and/or aseptic necrosis of one or more joints. Finally, the patient was informed that Medicine is not an exact science; therefore, there is also the possibility of unforeseen or unpredictable risks and/or possible complications that may result in a catastrophic outcome. The patient indicated having understood very clearly. We have given the patient no  guarantees and we have made no promises. Enough time was given to the patient to ask questions, all of which were answered to the patient's satisfaction. Erica Walters has indicated that she wanted to continue with the procedure. Attestation: I, the ordering provider, attest that I have discussed with the patient the benefits, risks, side-effects, alternatives, likelihood of achieving goals, and potential problems during recovery for the procedure that I have provided informed consent. Date  Time: 02/12/2022  8:58 AM  Pre-Procedure Preparation:  Monitoring: As per clinic protocol. Respiration, ETCO2, SpO2, BP, heart rate and rhythm monitor placed and checked for adequate function Safety Precautions: Patient was assessed for positional comfort and pressure points before starting the procedure. Time-out: I initiated and conducted the "Time-out" before starting the procedure, as per protocol. The patient was asked to participate by confirming the accuracy of the "Time Out" information. Verification of the correct person, site, and procedure were performed and confirmed by me, the nursing staff, and the patient. "Time-out" conducted as per Joint Commission's Universal Protocol (UP.01.01.01). Time: 0958  Description/Narrative of Procedure:          Target: Epidural space via interlaminar opening, initially targeting the lower laminar border of the superior vertebral body. Region: Lumbar Approach: Percutaneous paravertebral  Rationale (medical necessity): procedure needed and proper for the diagnosis and/or treatment of the patient's medical symptoms and needs. Procedural Technique Safety Precautions: Aspiration looking for blood return was conducted prior to all injections. At no point did we inject any substances, as a needle was being advanced. No attempts were made at seeking any paresthesias. Safe injection practices and needle disposal techniques used. Medications properly checked for expiration  dates. SDV (single dose vial) medications used. Description of the Procedure: Protocol guidelines were followed. The procedure needle was introduced through the skin, ipsilateral to the reported pain, and advanced to the target area. Bone was contacted and the needle walked caudad, until the lamina was cleared.  The epidural space was identified using "loss-of-resistance technique" with 2-3 ml of PF-NaCl (0.9% NSS), in a 5cc LOR glass syringe.   6 cc solution made of 3 cc of preservative-free saline, 2 cc of 0.2% ropivacaine, 1 cc of Decadron 10 mg/cc.   Vitals:   02/12/22 0902 02/12/22 0955 02/12/22 1000 02/12/22 1004  BP: 136/81 (!) 145/96 (!) 139/111 (!) 124/103  Pulse: 91     Resp: '16 16 18 16  '$ Temp: (!) 97 F (36.1 C)     TempSrc: Temporal     SpO2: 97% 98% 96% 97%  Weight: 200 lb (90.7 kg)     Height: '5\' 6"'$  (1.676 m)       Start Time: 0958 hrs. End Time: 1003 hrs.  Imaging Guidance (Spinal):          Type of Imaging Technique: Fluoroscopy Guidance (Spinal) Indication(s): Assistance in needle guidance and placement for procedures requiring needle placement in or near specific anatomical locations not easily accessible without such assistance. Exposure Time: Please see nurses notes. Contrast: Before injecting any contrast, we confirmed that the patient did not have an allergy to iodine, shellfish, or radiological contrast. Once satisfactory needle placement was completed at the desired level, radiological contrast was injected. Contrast injected under live fluoroscopy. No contrast complications. See chart for type and volume of contrast used. Fluoroscopic Guidance: I was personally present during the use of fluoroscopy. "Tunnel Vision Technique" used to obtain the best possible view of the target area. Parallax error corrected before commencing the procedure. "Direction-depth-direction" technique used to introduce the needle under continuous pulsed fluoroscopy. Once target was reached,  antero-posterior, oblique, and lateral fluoroscopic projection used confirm needle placement in all planes. Images permanently stored in EMR. Interpretation: I personally interpreted the imaging intraoperatively. Adequate needle placement confirmed in multiple planes. Appropriate spread of contrast into desired area was observed. No evidence of afferent or efferent intravascular uptake. No intrathecal or subarachnoid spread observed. Permanent images saved into the patient's record.   Post-operative Assessment:  Post-procedure Vital Signs:  Pulse/HCG Rate: 9192 Temp:  (!) 97 F (36.1 C) Resp: 16 BP:  (!) 124/103 (normal sized cuff. Denies any symptoms.) SpO2: 97 %  EBL: None  Complications: No immediate post-treatment complications observed by team, or reported by patient.  Note: The patient tolerated the entire procedure well. A repeat set of vitals were taken after the procedure and the patient was kept under observation following institutional policy, for this type of procedure. Post-procedural neurological assessment was performed, showing return to baseline, prior to discharge. The patient was provided with post-procedure discharge instructions, including a section on how to identify potential problems. Should any problems arise concerning this procedure, the patient was given instructions to immediately contact us, at any time, without hesitation. In any case, we plan to contact the patient by telephone for a follow-up status report regarding this interventional procedure.  Comments:  No additional relevant information.  Plan of Care  Orders:  Orders Placed This Encounter  Procedures   DG PAIN CLINIC C-ARM 1-60 MIN NO REPORT    Intraoperative interpretation by procedural physician at [place].    Standing Status:   Standing    Number of Occurrences:   1    Order Specific Question:   Reason for exam:    Answer:   Assistance in needle guidance and placement for  procedures requiring needle placement in or near specific anatomical locations not easily accessible without such assistance.   5 out of  5 strength bilateral lower extremity: Plantar flexion, dorsiflexion, knee flexion, knee extension.   Medications ordered for procedure: Meds ordered this encounter  Medications   iohexol (OMNIPAQUE) 180 MG/ML injection 10 mL    Must be Myelogram-compatible. If not available, you may substitute with a water-soluble, non-ionic, hypoallergenic, myelogram-compatible radiological contrast medium.   lidocaine (XYLOCAINE) 2 % (with pres) injection 400 mg   sodium chloride flush (NS) 0.9 % injection 2 mL   ropivacaine (PF) 2 mg/mL (0.2%) (NAROPIN) injection 2 mL   dexamethasone (DECADRON) injection 10 mg   Medications administered: We administered iohexol, lidocaine, sodium chloride flush, ropivacaine (PF) 2 mg/mL (0.2%), and dexamethasone.  See the medical record for exact dosing, route, and time of administration.  Follow-up plan:   Return in about 3 weeks (around 03/05/2022) for Post Procedure Evaluation, virtual.     Recent Visits Date Type Provider Dept  01/22/22 Office Visit Gillis Santa, MD [facility]  01/04/22 Office Visit Gillis Santa, MD [facility]  Showing recent visits within past 90 days and meeting all other requirements Today's Visits Date Type Provider Dept  02/12/22 Procedure visit Gillis Santa, MD [facility]  Showing today's visits and meeting all other requirements Future Appointments Date Type Provider Dept  03/05/22 Appointment Gillis Santa, MD [facility]  03/29/22 Appointment Gillis Santa, MD [facility]  Showing future appointments within next 90 days and meeting all other requirements  Disposition: Discharge home  Discharge (Date  Time): 02/12/2022; 1015 hrs.   Primary Care Physician: Susy Frizzle, MD Location: [facility] Note by:  Gillis Santa, MD Date: 02/12/2022; Time: 10:48 AM  Disclaimer:  Medicine is not an exact science. The only guarantee in medicine is that nothing is guaranteed. It is important to note that the decision to proceed with this intervention was based on the information collected from the patient. The Data and conclusions were drawn from the patient's questionnaire, the interview, and the physical examination. Because the information was provided in large part by the patient, it cannot be guaranteed that it has not been purposely or unconsciously manipulated. Every effort has been made to obtain as much relevant data as possible for this evaluation. It is important to note that the conclusions that lead to this procedure are derived in large part from the available data. Always take into account that the treatment will also be dependent on availability of resources and existing treatment guidelines, considered by other Pain Management Practitioners as being common knowledge and practice, at the time of the intervention. For Medico-Legal purposes, it is also important to point out that variation in procedural techniques and pharmacological choices are the acceptable norm. The indications, contraindications, technique, and results of the above procedure should only be interpreted and judged by a Board-Certified Interventional Pain Specialist with extensive familiarity and expertise in the same exact procedure and technique.

## 2022-02-13 ENCOUNTER — Telehealth: Payer: Self-pay | Admitting: *Deleted

## 2022-02-13 NOTE — Telephone Encounter (Signed)
Post procedure call;  patient reports that she is doing well.

## 2022-03-05 ENCOUNTER — Encounter: Payer: Self-pay | Admitting: Student in an Organized Health Care Education/Training Program

## 2022-03-05 ENCOUNTER — Ambulatory Visit
Payer: 59 | Attending: Student in an Organized Health Care Education/Training Program | Admitting: Student in an Organized Health Care Education/Training Program

## 2022-03-05 DIAGNOSIS — G894 Chronic pain syndrome: Secondary | ICD-10-CM

## 2022-03-05 DIAGNOSIS — M5136 Other intervertebral disc degeneration, lumbar region: Secondary | ICD-10-CM | POA: Diagnosis not present

## 2022-03-05 DIAGNOSIS — M5116 Intervertebral disc disorders with radiculopathy, lumbar region: Secondary | ICD-10-CM | POA: Diagnosis not present

## 2022-03-05 DIAGNOSIS — G5793 Unspecified mononeuropathy of bilateral lower limbs: Secondary | ICD-10-CM

## 2022-03-05 DIAGNOSIS — M47816 Spondylosis without myelopathy or radiculopathy, lumbar region: Secondary | ICD-10-CM | POA: Diagnosis not present

## 2022-03-05 NOTE — Progress Notes (Signed)
Patient: Erica Walters  Service Category: E/M  Provider: Gillis Santa, MD  DOB: Jul 19, 1965  DOS: 03/05/2022  Location: Office  MRN: [id number]  Setting: Ambulatory outpatient  Referring Provider: Susy Frizzle, MD  Type: Established Patient  Specialty: Interventional Pain Management  PCP: Susy Frizzle, MD  Location: Remote location  Delivery: TeleHealth     Virtual Encounter - Pain Management PROVIDER NOTE: Information contained herein reflects review and annotations entered in association with encounter. Interpretation of such information and data should be left to medically-trained personnel. Information provided to patient can be located elsewhere in the medical record under "Patient Instructions". Document created using STT-dictation technology, any transcriptional errors that may result from process are unintentional.    Contact & Pharmacy Preferred: [phone] Home: [phone] (home) Mobile: [phone] (mobile) E-mail: [email or web address] [place] [place] [address] Phone: [phone] Fax: [phone]   Pre-screening  Erica Walters offered "in-person" vs "virtual" encounter. She indicated preferring virtual for this encounter.   Reason COVID-19*  Social distancing based on CDC and AMA recommendations.   I contacted Erica Walters on 03/05/2022 via telephone.      I clearly identified myself as Gillis Santa, MD. I verified that I was speaking with the correct person using two identifiers (Name: Erica Walters, and date of birth: 1965/12/24).  Consent I sought verbal advanced consent from Erica Walters for virtual visit interactions. I informed Erica Walters of possible security and privacy concerns, risks, and limitations associated with providing "not-in-person" medical evaluation and management services. I also informed Erica Walters of the availability of "in-person"  appointments. Finally, I informed her that there would be a charge for the virtual visit and that she could be  personally, fully or partially, financially responsible for it. Erica Walters expressed understanding and agreed to proceed.   Historic Elements   Erica Walters is a 56 y.o. year old, female patient evaluated today after our last contact on 02/12/2022. Erica Walters  has a past medical history of Allergy, Arthritis, and Hyperlipidemia. She also  has a past surgical history that includes Total abdominal hysterectomy w/ bilateral salpingoophorectomy and Colonoscopy (12/04/2017). Erica Walters has a current medication list which includes the following prescription(s): cyclobenzaprine, nortriptyline, pregabalin, tramadol, and semaglutide (1 mg/dose). She  reports that she has been smoking cigarettes. She has a 13.00 pack-year smoking history. She has never used smokeless tobacco. She reports current alcohol use of about 14.0 standard drinks of alcohol per week. She reports that she does not use drugs. Erica Walters has No Known Allergies.   HPI  Today, she is being contacted for a post-procedure assessment.   Post-procedure evaluation   Type: Lumbar epidural steroid injection (LESI) (interlaminar) #1    Laterality: Left   Level:  L1-2 Level.  Imaging: Fluoroscopic guidance         Anesthesia: Local anesthesia (1-2% Lidocaine) DOS: 02/12/2022  Performed by: Gillis Santa, MD  Purpose: Diagnostic/Therapeutic Indications: Lumbar radicular pain of intraspinal etiology of more than 4 weeks that has failed to respond to conservative therapy and is severe enough to impact quality of life or function. 1. Neuropathy involving both lower extremities   2. Chronic pain syndrome   3. Lumbar disc herniation with radiculopathy    NAS-11 Pain score:   Pre-procedure: 7 /10   Post-procedure: 4 /10      Effectiveness:  Initial hour after procedure: 100 %  Subsequent 4-6 hours post-procedure: 100 %   Analgesia past initial 6 hours: 100 % (epidural gave relief for approx 4 days and then the pain returned and became worse)  Ongoing improvement:  Analgesic:  <20% Function: Back to baseline ROM: Back to baseline    Laboratory Chemistry Profile   Renal Lab Results  Component Value Date   BUN 11 01/23/2019   CREATININE 0.81 16/03/9603   BCR NOT APPLICABLE 54/02/8118   GFRAA 97 01/23/2019   GFRNONAA 84 01/23/2019    Hepatic Lab Results  Component Value Date   AST 44 (H) 01/23/2019   ALT 62 (H) 01/23/2019   ALBUMIN 4.1 07/19/2015   ALKPHOS 83 07/19/2015   LIPASE 30 07/19/2015    Electrolytes Lab Results  Component Value Date   NA 141 01/23/2019   K 4.8 01/23/2019   CL 106 01/23/2019   CALCIUM 9.7 01/23/2019    Bone Lab Results  Component Value Date   VD25OH 31 01/23/2019    Inflammation (CRP: Acute Phase) (ESR: Chronic Phase) Lab Results  Component Value Date   CRP 4.7 12/25/2018   ESRSEDRATE 14 12/25/2018         Note: Above Lab results reviewed.   Assessment  The primary encounter diagnosis was Lumbar disc herniation with radiculopathy. Diagnoses of Lumbar facet arthropathy, Lumbar degenerative disc disease, Neuropathy involving both lower extremities, and Chronic pain syndrome were also pertinent to this visit.  Plan of Care   Limited benefit with epidural steroid injection.  Patient states that this last week has not been as bad because she has been off work.  She returns to work tomorrow.  She has a medication management appointment with me early next month which have instructed her to keep to also review her symptoms as well.  Patient endorsed understanding.   Follow-up plan:   Return for Keep sch. appt.       Recent Visits Date Type Provider Dept  02/12/22 Procedure visit Gillis Santa, MD [facility]  01/22/22 Office Visit Gillis Santa, MD [facility]  01/04/22 Office Visit Gillis Santa, MD [facility]  Showing  recent visits within past 90 days and meeting all other requirements Today's Visits Date Type Provider Dept  03/05/22 Office Visit Gillis Santa, MD [facility]  Showing today's visits and meeting all other requirements Future Appointments Date Type Provider Dept  03/29/22 Appointment Gillis Santa, MD [facility]  Showing future appointments within next 90 days and meeting all other requirements  I discussed the assessment and treatment plan with the patient. The patient was provided an opportunity to ask questions and all were answered. The patient agreed with the plan and demonstrated an understanding of the instructions.  Patient advised to call back or seek an in-person evaluation if the symptoms or condition worsens.  Duration of encounter: 29mnutes.  Note by: BGillis Santa MD Date: 03/05/2022; Time: 3:06 PM

## 2022-03-29 ENCOUNTER — Ambulatory Visit
Payer: 59 | Attending: Student in an Organized Health Care Education/Training Program | Admitting: Student in an Organized Health Care Education/Training Program

## 2022-03-29 ENCOUNTER — Encounter: Payer: Self-pay | Admitting: Student in an Organized Health Care Education/Training Program

## 2022-03-29 VITALS — BP 131/92 | HR 101 | Ht 66.0 in | Wt 200.0 lb

## 2022-03-29 DIAGNOSIS — M5136 Other intervertebral disc degeneration, lumbar region: Secondary | ICD-10-CM | POA: Insufficient documentation

## 2022-03-29 DIAGNOSIS — M47816 Spondylosis without myelopathy or radiculopathy, lumbar region: Secondary | ICD-10-CM

## 2022-03-29 DIAGNOSIS — G5793 Unspecified mononeuropathy of bilateral lower limbs: Secondary | ICD-10-CM | POA: Diagnosis present

## 2022-03-29 DIAGNOSIS — M5116 Intervertebral disc disorders with radiculopathy, lumbar region: Secondary | ICD-10-CM

## 2022-03-29 DIAGNOSIS — M51369 Other intervertebral disc degeneration, lumbar region without mention of lumbar back pain or lower extremity pain: Secondary | ICD-10-CM

## 2022-03-29 DIAGNOSIS — G894 Chronic pain syndrome: Secondary | ICD-10-CM

## 2022-03-29 MED ORDER — PREGABALIN 100 MG PO CAPS: 100.0000 mg | ORAL_CAPSULE | Freq: Two times a day (BID) | ORAL | 5 refills | Status: DC

## 2022-03-29 MED ORDER — TRAMADOL HCL 50 MG PO TABS: 50.0000 mg | ORAL_TABLET | Freq: Three times a day (TID) | ORAL | 2 refills | Status: AC | PRN

## 2022-03-29 NOTE — Progress Notes (Signed)
PROVIDER NOTE: Information contained herein reflects review and annotations entered in association with encounter. Interpretation of such information and data should be left to medically-trained personnel. Information provided to patient can be located elsewhere in the medical record under "Patient Instructions". Document created using STT-dictation technology, any transcriptional errors that may result from process are unintentional.    Patient: Erica Walters  Service Category: E/M  Provider: Gillis Santa, MD  DOB: 09-27-1965  DOS: 03/29/2022  Referring Provider: Susy Frizzle, MD  MRN: [id number]  Specialty: Interventional Pain Management  PCP: Susy Frizzle, MD  Type: Established Patient  Setting: Ambulatory outpatient    Location: Office  Delivery: Face-to-face     HPI  Ms. Erica Walters, a 56 y.o. year old female, is here today because of her Lumbar facet arthropathy [M47.816]. [name]'s primary complain today is Back Pain (low) Last encounter: My last encounter with her was on 03/05/2022. Pertinent problems: [name] has Cervical radiculopathy; DDD (degenerative disc disease), cervical; and Bilateral plantar fasciitis on their pertinent problem list. Pain Assessment: Severity of Chronic pain is reported as a 4 /10. Location: Back Lower/feels fatigued in legs. Onset: More than a month ago. Quality: Aching, Sharp, Discomfort. Timing: Constant. Modifying factor(s): meds, rest. Vitals:  height is '5\' 6"'  (1.676 m) and weight is 200 lb (90.7 kg). Her blood pressure is 131/92 (abnormal) and her pulse is 101 (abnormal). Her oxygen saturation is 99%.   Reason for encounter: Medication management and increased low back pain.  Patient is experiencing low back pain that is worse with lumbar extension and facet loading.  She states that her radiating leg pain after her lumbar epidural steroid injection has significantly improved. She continues Lyrica and tramadol as prescribed.  No side  effects.   Pharmacotherapy Assessment  Analgesic: Tramadol 50 mg 3 times daily as needed  Monitoring: [place] PMP: PDMP reviewed during this encounter.       Pharmacotherapy: No side-effects or adverse reactions reported. Compliance: No problems identified. Effectiveness: Clinically acceptable.  Dewayne Shorter, RN  03/29/2022 11:05 AM  Sign when Signing Visit Nursing Pain Medication Assessment:  Safety precautions to be maintained throughout the outpatient stay will include: orient to surroundings, keep bed in low position, maintain call bell within reach at all times, provide assistance with transfer out of bed and ambulation.  Medication Inspection Compliance: Pill count conducted under aseptic conditions, in front of the patient. Neither the pills nor the bottle was removed from the patient's sight at any time. Once count was completed pills were immediately returned to the patient in their original bottle.  Medication: Tramadol (Ultram) Pill/Patch Count: 56/90 Pill/Patch Appearance: Markings consistent with prescribed medication Bottle Appearance: Standard pharmacy container. Clearly labeled. Filled Date: 09 / 18 / 2023 Last Medication intake:  Yesterday  No results found for: "CBDTHCR" No results found for: "D8THCCBX" No results found for: "D9THCCBX"  UDS:  Summary  Date Value Ref Range Status  01/22/2022 Note  Final    Comment:    ==================================================================== Compliance Drug Analysis, Ur ==================================================================== Test                             Result       Flag       Units  Drug Present and Declared for Prescription Verification   Tramadol                       >2336  EXPECTED   ng/mg creat   O-Desmethyltramadol            >2336        EXPECTED   ng/mg creat   N-Desmethyltramadol            >2336        EXPECTED   ng/mg creat    Source of tramadol is a prescription medication.  O-desmethyltramadol    and N-desmethyltramadol are expected metabolites of tramadol.    Pregabalin                     PRESENT      EXPECTED   Desmethylcyclobenzaprine       PRESENT      EXPECTED    Desmethylcyclobenzaprine is an expected metabolite of    cyclobenzaprine.    Nortriptyline                  PRESENT      EXPECTED    Nortriptyline may be administered as a prescription drug; it is also    an expected metabolite of amitriptyline.  Drug Present not Declared for Prescription Verification   Ibuprofen                      PRESENT      UNEXPECTED ==================================================================== Test                      Result    Flag   Units      Ref Range   Creatinine              214              mg/dL      >=20 ==================================================================== Declared Medications:  The flagging and interpretation on this report are based on the  following declared medications.  Unexpected results may arise from  inaccuracies in the declared medications.   **Note: The testing scope of this panel includes these medications:   Cyclobenzaprine (Flexeril)  Nortriptyline (Pamelor)  Pregabalin (Lyrica)  Tramadol (Ultram)   **Note: The testing scope of this panel does not include the  following reported medications:   Semaglutide ==================================================================== For clinical consultation, please call [phone]. ====================================================================       ROS  Constitutional: Denies any fever or chills Gastrointestinal: No reported hemesis, hematochezia, vomiting, or acute GI distress Musculoskeletal:  Low back pain Neurological: No reported episodes of acute onset apraxia, aphasia, dysarthria, agnosia, amnesia, paralysis, loss of coordination, or loss of consciousness  Medication Review  cyclobenzaprine, ibuprofen, nortriptyline, pregabalin, and  traMADol  History Review  Allergy: [name] has No Known Allergies. Drug: [name]  reports no history of drug use. Alcohol:  reports current alcohol use of about 14.0 standard drinks of alcohol per week. Tobacco:  reports that she has been smoking cigarettes. She has a 13.00 pack-year smoking history. She has never used smokeless tobacco. Social: Ms. Brossart  reports that she has been smoking cigarettes. She has a 13.00 pack-year smoking history. She has never used smokeless tobacco. She reports current alcohol use of about 14.0 standard drinks of alcohol per week. She reports that she does not use drugs. Medical:  has a past medical history of Allergy, Arthritis, and Hyperlipidemia. Surgical: Ms. Rooks  has a past surgical history that includes Total abdominal hysterectomy w/ bilateral salpingoophorectomy and Colonoscopy (12/04/2017). Family: family history includes Colon cancer in her paternal grandfather.  Laboratory Chemistry Profile  Renal Lab Results  Component Value Date   BUN 11 01/23/2019   CREATININE 0.81 03/00/9233   BCR NOT APPLICABLE 00/76/2263   GFRAA 97 01/23/2019   GFRNONAA 84 01/23/2019    Hepatic Lab Results  Component Value Date   AST 44 (H) 01/23/2019   ALT 62 (H) 01/23/2019   ALBUMIN 4.1 07/19/2015   ALKPHOS 83 07/19/2015   LIPASE 30 07/19/2015    Electrolytes Lab Results  Component Value Date   NA 141 01/23/2019   K 4.8 01/23/2019   CL 106 01/23/2019   CALCIUM 9.7 01/23/2019    Bone Lab Results  Component Value Date   VD25OH 31 01/23/2019    Inflammation (CRP: Acute Phase) (ESR: Chronic Phase) Lab Results  Component Value Date   CRP 4.7 12/25/2018   ESRSEDRATE 14 12/25/2018         Note: Above Lab results reviewed.  Recent Imaging Review   CLINICAL DATA:  Initial evaluation for low back pain with left-sided radicular symptoms.   EXAM: MRI LUMBAR SPINE WITHOUT CONTRAST   TECHNIQUE: Multiplanar, multisequence MR imaging  of the lumbar spine was performed. No intravenous contrast was administered.   COMPARISON:  Prior MRI from 04/11/2019.   FINDINGS: Segmentation: Standard. Lowest well-formed disc space labeled the L5-S1 level.   Alignment: Trace degenerative anterolisthesis of L5 on S1. Alignment otherwise normal with preservation of the normal lumbar lordosis.   Vertebrae: Vertebral body height maintained without acute or chronic fracture. Bone marrow signal intensity diffusely heterogeneous with multiple scattered benign hemangiomata. No worrisome osseous lesions. Mild reactive edema present about the left L5-S1 facet due to facet arthritis. No other abnormal marrow edema.   Conus medullaris and cauda equina: Conus extends to the T12-L1 level. Conus and cauda equina appear normal.   Paraspinal and other soft tissues: Paraspinous soft tissues within normal limits. Subcentimeter simple right renal cyst noted, benign in appearance, no follow-up imaging recommended. Visualized visceral structures otherwise unremarkable.   Disc levels:   T11-12: Seen only on sagittal projection. Disc bulge with mild disc desiccation. No stenosis.   T12-L1: Mild disc bulge with disc desiccation.  No stenosis.   L1-2: Diffuse disc bulge with disc desiccation. Superimposed tiny left subarticular disc extrusion with inferior migration (series 6, images 9, 10, 11). The descending left L2 nerve root could potentially be affected. No significant canal or lateral recess stenosis. Foramina remain patent.   L2-3: Disc desiccation with mild annular disc bulge. Mild facet hypertrophy. No stenosis.   L3-4: Disc desiccation with mild diffuse disc bulge. Reactive endplate spurring. Superimposed broad right foraminal to extraforaminal disc protrusion (series 7, image 22), potentially affecting the exiting right L3 nerve root. Mild bilateral facet hypertrophy. Resultant mild narrowing of the lateral recesses bilaterally.  Central canal remains patent. Mild bilateral L3 foraminal stenosis.   L4-5: Disc desiccation with mild disc bulge. Mild to moderate facet hypertrophy. No significant spinal stenosis. Foramina remain patent.   L5-S1: Degenerative intervertebral disc space narrowing with disc desiccation and diffuse disc bulge. Mild reactive endplate spurring. Moderate left worse than right facet arthrosis. No significant spinal stenosis. Mild left L5 foraminal narrowing. Right neural foramen remains patent.   IMPRESSION: 1. Tiny left subarticular disc extrusion with inferior migration at L1-2, potentially affecting the descending left L2 nerve root. 2. Broad right foraminal to extraforaminal disc protrusion at L3-4, potentially affecting the exiting right L3 nerve root. 3. Mild to moderate bilateral facet hypertrophy at L2-3 through L5-S1, most pronounced at L5-S1 on the  left. Finding could serve as a source for lower back pain. 4. Additional mild noncompressive disc bulging elsewhere throughout the lumbar spine as above. No other significant stenosis or neural impingement.     Electronically Signed   By: Jeannine Boga M.D.   On: 01/21/2022 01:43    Physical Exam  General appearance: Well nourished, well developed, and well hydrated. In no apparent acute distress Mental status: Alert, oriented x 3 (person, place, & time)       Respiratory: No evidence of acute respiratory distress Eyes: PERLA Vitals: BP (!) 131/92   Pulse (!) 101   Ht '5\' 6"'  (1.676 m)   Wt 200 lb (90.7 kg)   SpO2 99%   BMI 32.28 kg/m  BMI: Estimated body mass index is 32.28 kg/m as calculated from the following:   Height as of this encounter: '5\' 6"'  (1.676 m).   Weight as of this encounter: 200 lb (90.7 kg). Ideal: Ideal body weight: 59.3 kg (130 lb 11.7 oz) Adjusted ideal body weight: 71.9 kg (158 lb 7 oz)  Lumbar Spine Area Exam  Skin & Axial Inspection: No masses, redness, or swelling Alignment:  Symmetrical Functional ROM: Pain restricted ROM affecting both sides Stability: No instability detected Muscle Tone/Strength: Functionally intact. No obvious neuro-muscular anomalies detected. Sensory (Neurological): Musculoskeletal pain pattern: Facet mediated Palpation: No palpable anomalies       Provocative Tests: Hyperextension/rotation test: (+) bilaterally for facet joint pain. Lumbar quadrant test (Kemp's test): (+) bilaterally for facet joint pain. Lateral bending test: deferred today       Patrick's Maneuver: deferred today                   FABER* test: deferred today                   S-I anterior distraction/compression test: deferred today         S-I lateral compression test: deferred today         S-I Thigh-thrust test: deferred today         S-I Gaenslen's test: deferred today         *(Flexion, ABduction and External Rotation) Gait & Posture Assessment  Ambulation: Unassisted Gait: Relatively normal for age and body habitus Posture: WNL  Lower Extremity Exam    Side: Right lower extremity  Side: Left lower extremity  Stability: No instability observed          Stability: No instability observed          Skin & Extremity Inspection: Skin color, temperature, and hair growth are WNL. No peripheral edema or cyanosis. No masses, redness, swelling, asymmetry, or associated skin lesions. No contractures.  Skin & Extremity Inspection: Skin color, temperature, and hair growth are WNL. No peripheral edema or cyanosis. No masses, redness, swelling, asymmetry, or associated skin lesions. No contractures.  Functional ROM: Unrestricted ROM                  Functional ROM: Unrestricted ROM                  Muscle Tone/Strength: Functionally intact. No obvious neuro-muscular anomalies detected.  Muscle Tone/Strength: Functionally intact. No obvious neuro-muscular anomalies detected.  Sensory (Neurological): Unimpaired        Sensory (Neurological): Unimpaired        DTR: Patellar:  deferred today Achilles: deferred today Plantar: deferred today  DTR: Patellar: deferred today Achilles: deferred today Plantar: deferred today  Palpation: No palpable anomalies  Palpation: No palpable anomalies    Assessment   Diagnosis Status  1. Lumbar facet arthropathy   2. Lumbar degenerative disc disease   3. Lumbar disc herniation with radiculopathy   4. Chronic pain syndrome   5. Neuropathy involving both lower extremities    Persistent Persistent Controlled   Updated Problems: No problems updated.   Plan of Care   Bentli Llorente has a history of greater than 3 months of moderate to severe pain which is resulted in functional impairment.  The patient has tried various conservative therapeutic options such as NSAIDs, Tylenol, muscle relaxants, home directed physical therapye xercises  which was inadequately effective.  Patient's pain is predominantly axial with physical exam and lumbar MRI findings suggestive of facet arthropathy.  Lumbar facet medial branch nerve blocks were discussed with the patient.  Risks and benefits were reviewed.  Patient would like to proceed with bilateral L3, L4, L5 medial branch nerve block.    Pharmacotherapy (Medications Ordered): Meds ordered this encounter  Medications   traMADol (ULTRAM) 50 MG tablet    Sig: Take 1 tablet (50 mg total) by mouth every 8 (eight) hours as needed for moderate pain.    Dispense:  90 tablet    Refill:  2   pregabalin (LYRICA) 100 MG capsule    Sig: Take 1 capsule (100 mg total) by mouth 2 (two) times daily.    Dispense:  60 capsule    Refill:  5    Fill one day early if pharmacy is closed on scheduled refill date. May substitute for generic if available.   Orders:  Orders Placed This Encounter  Procedures   LUMBAR FACET(MEDIAL BRANCH NERVE BLOCK) MBNB    Standing Status:   Future    Standing Expiration Date:   06/29/2022    Scheduling Instructions:     Procedure: Lumbar facet block (AKA.:  Lumbosacral medial branch nerve block)     Side: Bilateral     Level: L3-4 & L5-S1 Facets ( L3, L4, L5, Medial Branch Nerves)     Sedation: without     Timeframe: ASAA    Order Specific Question:   Where will this procedure be performed?    Answer:   [facility]   Follow-up plan:   Return in about 18 days (around 04/16/2022) for B/L L3, 4, 5 MBNB , in clinic NS.    Recent Visits Date Type Provider Dept  03/05/22 Office Visit Gillis Santa, MD [facility]  02/12/22 Procedure visit Gillis Santa, MD [facility]  01/22/22 Office Visit Gillis Santa, MD [facility]  01/04/22 Office Visit Gillis Santa, MD [facility]  Showing recent visits within past 90 days and meeting all other requirements Today's Visits Date Type Provider Dept  03/29/22 Office Visit Gillis Santa, MD [facility]  Showing today's visits and meeting all other requirements Future Appointments No visits were found meeting these conditions. Showing future appointments within next 90 days and meeting all other requirements  I discussed the assessment and treatment plan with the patient. The patient was provided an opportunity to ask questions and all were answered. The patient agreed with the plan and demonstrated an understanding of the instructions.  Patient advised to call back or seek an in-person evaluation if the symptoms or condition worsens.  Duration of encounter: 74mnutes.  Total time on encounter, as per AMA guidelines included both the face-to-face and non-face-to-face time personally spent by the physician and/or other qualified health care  professional(s) on the day of the encounter (includes time in activities that require the physician or other qualified health care professional and does not include time in activities normally performed by clinical staff). Physician's time may include the following activities when performed: preparing to see the  patient (eg, review of tests, pre-charting review of records) obtaining and/or reviewing separately obtained history performing a medically appropriate examination and/or evaluation counseling and educating the patient/family/caregiver ordering medications, tests, or procedures referring and communicating with other health care professionals (when not separately reported) documenting clinical information in the electronic or other health record independently interpreting results (not separately reported) and communicating results to the patient/ family/caregiver care coordination (not separately reported)  Note by: Gillis Santa, MD Date: 03/29/2022; Time: 11:26 AM

## 2022-03-29 NOTE — Progress Notes (Signed)
Nursing Pain Medication Assessment:  Safety precautions to be maintained throughout the outpatient stay will include: orient to surroundings, keep bed in low position, maintain call bell within reach at all times, provide assistance with transfer out of bed and ambulation.  Medication Inspection Compliance: Pill count conducted under aseptic conditions, in front of the patient. Neither the pills nor the bottle was removed from the patient's sight at any time. Once count was completed pills were immediately returned to the patient in their original bottle.  Medication: Tramadol (Ultram) Pill/Patch Count: 56/90 Pill/Patch Appearance: Markings consistent with prescribed medication Bottle Appearance: Standard pharmacy container. Clearly labeled. Filled Date: 09 / 18 / 2023 Last Medication intake:  Yesterday

## 2022-03-29 NOTE — Patient Instructions (Signed)

## 2022-04-03 ENCOUNTER — Telehealth: Payer: Self-pay | Admitting: Family Medicine

## 2022-04-03 NOTE — Telephone Encounter (Signed)
Receive call from [facility] with [facility] to follow up on request received for patient to have foot/toe injections; referral needed.  Please advise at [phone] (direct line).

## 2022-04-04 NOTE — Telephone Encounter (Signed)
Patient called to follow up on status of referral. Please advise at [phone]

## 2022-04-09 ENCOUNTER — Telehealth: Payer: Self-pay

## 2022-04-09 NOTE — Telephone Encounter (Signed)
Pt called in to inquire about a referral? Please advise.  Cb#: [phone]

## 2022-04-26 ENCOUNTER — Other Ambulatory Visit: Payer: Self-pay | Admitting: Student in an Organized Health Care Education/Training Program

## 2022-04-26 DIAGNOSIS — M5412 Radiculopathy, cervical region: Secondary | ICD-10-CM

## 2022-04-26 DIAGNOSIS — G894 Chronic pain syndrome: Secondary | ICD-10-CM

## 2022-04-26 DIAGNOSIS — G5793 Unspecified mononeuropathy of bilateral lower limbs: Secondary | ICD-10-CM

## 2022-04-26 DIAGNOSIS — M549 Dorsalgia, unspecified: Secondary | ICD-10-CM

## 2022-05-14 ENCOUNTER — Encounter: Payer: Self-pay | Admitting: Student in an Organized Health Care Education/Training Program

## 2022-05-14 ENCOUNTER — Ambulatory Visit
Admission: RE | Admit: 2022-05-14 | Discharge: 2022-05-14 | Disposition: A | Discharge status: Home / Self Care | Payer: 59 | Source: Ambulatory Visit | Attending: Student in an Organized Health Care Education/Training Program | Admitting: Student in an Organized Health Care Education/Training Program

## 2022-05-14 ENCOUNTER — Ambulatory Visit
Payer: 59 | Attending: Student in an Organized Health Care Education/Training Program | Admitting: Student in an Organized Health Care Education/Training Program

## 2022-05-14 ENCOUNTER — Encounter: Payer: Self-pay | Admitting: Family Medicine

## 2022-05-14 VITALS — BP 141/99 | HR 81 | Temp 97.3°F | Resp 17 | Ht 67.0 in | Wt 200.0 lb

## 2022-05-14 DIAGNOSIS — M5136 Other intervertebral disc degeneration, lumbar region: Secondary | ICD-10-CM | POA: Diagnosis not present

## 2022-05-14 DIAGNOSIS — M47896 Other spondylosis, lumbar region: Secondary | ICD-10-CM | POA: Diagnosis not present

## 2022-05-14 DIAGNOSIS — M545 Low back pain, unspecified: Secondary | ICD-10-CM | POA: Diagnosis present

## 2022-05-14 DIAGNOSIS — M5116 Intervertebral disc disorders with radiculopathy, lumbar region: Secondary | ICD-10-CM | POA: Insufficient documentation

## 2022-05-14 DIAGNOSIS — M47816 Spondylosis without myelopathy or radiculopathy, lumbar region: Secondary | ICD-10-CM

## 2022-05-14 DIAGNOSIS — G894 Chronic pain syndrome: Secondary | ICD-10-CM | POA: Diagnosis not present

## 2022-05-14 MED ORDER — DEXAMETHASONE SODIUM PHOSPHATE 10 MG/ML IJ SOLN
INTRAMUSCULAR | Status: AC
  Filled 2022-05-14: qty 2

## 2022-05-14 MED ORDER — LIDOCAINE HCL 2 % IJ SOLN
20.0000 mL | Freq: Once | INTRAMUSCULAR | Status: AC
  Administered 2022-05-14: 400 mg

## 2022-05-14 MED ORDER — ROPIVACAINE HCL 2 MG/ML IJ SOLN
9.0000 mL | Freq: Once | INTRAMUSCULAR | Status: AC
  Administered 2022-05-14: 9 mL via PERINEURAL

## 2022-05-14 MED ORDER — LIDOCAINE HCL 2 % IJ SOLN
INTRAMUSCULAR | Status: AC
  Filled 2022-05-14: qty 20

## 2022-05-14 MED ORDER — ROPIVACAINE HCL 2 MG/ML IJ SOLN
INTRAMUSCULAR | Status: AC
  Filled 2022-05-14: qty 20

## 2022-05-14 MED ORDER — DEXAMETHASONE SODIUM PHOSPHATE 10 MG/ML IJ SOLN
10.0000 mg | Freq: Once | INTRAMUSCULAR | Status: AC
  Administered 2022-05-14: 10 mg

## 2022-05-14 NOTE — Progress Notes (Signed)
PROVIDER NOTE: Interpretation of information contained herein should be left to medically-trained personnel. Specific patient instructions are provided elsewhere under "Patient Instructions" section of medical record. This document was created in part using STT-dictation technology, any transcriptional errors that may result from this process are unintentional.  Patient: Erica Walters Type: Established DOB: [birth year] MRN: [id number] PCP: Susy Frizzle, MD  Service: Procedure DOS: 05/14/2022 Setting: Ambulatory Location: Ambulatory outpatient facility Delivery: Face-to-face Provider: Gillis Santa, MD Specialty: Interventional Pain Management Specialty designation: 09 Location: Outpatient facility Ref. Prov.: Gillis Santa, MD    Procedure:           Type: Lumbar Facet, Medial Branch Block(s) #1  Laterality: Bilateral  Level: L3, L4, L5, Medial Branch Level(s). Injecting these levels blocks the L3-4 and L4-5 lumbar facet joints. Imaging: Fluoroscopic guidance         Anesthesia: Local anesthesia (1-2% Lidocaine) DOS: 05/14/2022 Performed by: Gillis Santa, MD  Primary Purpose: Diagnostic/Therapeutic Indications: Low back pain severe enough to impact quality of life or function. 1. Lumbar facet arthropathy   2. Lumbar degenerative disc disease   3. Lumbar disc herniation with radiculopathy   4. Chronic pain syndrome    NAS-11 Pain score:   Pre-procedure: 3 /10   Post-procedure: 3 /10     Position / Prep / Materials:  Position: Prone  Prep solution: DuraPrep (Iodine Povacrylex [0.7% available iodine] and Isopropyl Alcohol, 74% w/w) Area Prepped: Posterolateral Lumbosacral Spine (Wide prep: From the lower border of the scapula down to the end of the tailbone and from flank to flank.)  Materials:  Tray: Block Needle(s):  Type: Spinal  Gauge (G): 22  Length: 5-in Qty: 3     Pre-op H&P Assessment:  Erica Walters is a 56 y.o. (year old), female patient, seen today for  interventional treatment. She  has a past surgical history that includes Total abdominal hysterectomy w/ bilateral salpingoophorectomy and Colonoscopy (12/04/2017). Erica Walters has a current medication list which includes the following prescription(s): cyclobenzaprine, ibuprofen, [START ON 06/08/2022] pregabalin, tramadol, and nortriptyline. Her primarily concern today is the Back Pain (lower)  Initial Vital Signs:  Pulse/HCG Rate: 88  Temp: (!) 97.3 F (36.3 C) Resp: 16 BP: (!) 140/83 SpO2: 100 %  BMI: Estimated body mass index is 31.32 kg/m as calculated from the following:   Height as of this encounter: '5\' 7"'$  (1.702 m).   Weight as of this encounter: 200 lb (90.7 kg).  Risk Assessment: Allergies: Reviewed. She has No Known Allergies.  Allergy Precautions: None required Coagulopathies: Reviewed. None identified.  Blood-thinner therapy: None at this time Active Infection(s): Reviewed. None identified. Erica Walters is afebrile  Site Confirmation: Erica Walters was asked to confirm the procedure and laterality before marking the site Procedure checklist: Completed Consent: Before the procedure and under the influence of no sedative(s), amnesic(s), or anxiolytics, the patient was informed of the treatment options, risks and possible complications. To fulfill our ethical and legal obligations, as recommended by the American Medical Association's Code of Ethics, I have informed the patient of my clinical impression; the nature and purpose of the treatment or procedure; the risks, benefits, and possible complications of the intervention; the alternatives, including doing nothing; the risk(s) and benefit(s) of the alternative treatment(s) or procedure(s); and the risk(s) and benefit(s) of doing nothing. The patient was provided information about the general risks and possible complications associated with the procedure. These may include, but are not limited to: failure to achieve desired goals,  infection, bleeding, organ or  nerve damage, allergic reactions, paralysis, and death. In addition, the patient was informed of those risks and complications associated to Spine-related procedures, such as failure to decrease pain; infection (i.e.: Meningitis, epidural or intraspinal abscess); bleeding (i.e.: epidural hematoma, subarachnoid hemorrhage, or any other type of intraspinal or peri-dural bleeding); organ or nerve damage (i.e.: Any type of peripheral nerve, nerve root, or spinal cord injury) with subsequent damage to sensory, motor, and/or autonomic systems, resulting in permanent pain, numbness, and/or weakness of one or several areas of the body; allergic reactions; (i.e.: anaphylactic reaction); and/or death. Furthermore, the patient was informed of those risks and complications associated with the medications. These include, but are not limited to: allergic reactions (i.e.: anaphylactic or anaphylactoid reaction(s)); adrenal axis suppression; blood sugar elevation that in diabetics may result in ketoacidosis or comma; water retention that in patients with history of congestive heart failure may result in shortness of breath, pulmonary edema, and decompensation with resultant heart failure; weight gain; swelling or edema; medication-induced neural toxicity; particulate matter embolism and blood vessel occlusion with resultant organ, and/or nervous system infarction; and/or aseptic necrosis of one or more joints. Finally, the patient was informed that Medicine is not an exact science; therefore, there is also the possibility of unforeseen or unpredictable risks and/or possible complications that may result in a catastrophic outcome. The patient indicated having understood very clearly. We have given the patient no guarantees and we have made no promises. Enough time was given to the patient to ask questions, all of which were answered to the patient's satisfaction. Erica Walters has indicated that she  wanted to continue with the procedure. Attestation: I, the ordering provider, attest that I have discussed with the patient the benefits, risks, side-effects, alternatives, likelihood of achieving goals, and potential problems during recovery for the procedure that I have provided informed consent. Date  Time: 05/14/2022 10:00 AM  Pre-Procedure Preparation:  Monitoring: As per clinic protocol. Respiration, ETCO2, SpO2, BP, heart rate and rhythm monitor placed and checked for adequate function Safety Precautions: Patient was assessed for positional comfort and pressure points before starting the procedure. Time-out: I initiated and conducted the "Time-out" before starting the procedure, as per protocol. The patient was asked to participate by confirming the accuracy of the "Time Out" information. Verification of the correct person, site, and procedure were performed and confirmed by me, the nursing staff, and the patient. "Time-out" conducted as per Joint Commission's Universal Protocol (UP.01.01.01). Time: 1037  Description of Procedure:          Laterality: Bilateral. The procedure was performed in identical fashion on both sides. Targeted Levels:  L3, L4, L5, Medial Branch Level(s)  Safety Precautions: Aspiration looking for blood return was conducted prior to all injections. At no point did we inject any substances, as a needle was being advanced. Before injecting, the patient was told to immediately notify me if she was experiencing any new onset of "ringing in the ears, or metallic taste in the mouth". No attempts were made at seeking any paresthesias. Safe injection practices and needle disposal techniques used. Medications properly checked for expiration dates. SDV (single dose vial) medications used. After the completion of the procedure, all disposable equipment used was discarded in the proper designated medical waste containers. Local Anesthesia: Protocol guidelines were followed. The  patient was positioned over the fluoroscopy table. The area was prepped in the usual manner. The time-out was completed. The target area was identified using fluoroscopy. A 12-in long, straight, sterile hemostat was used  with fluoroscopic guidance to locate the targets for each level blocked. Once located, the skin was marked with an approved surgical skin marker. Once all sites were marked, the skin (epidermis, dermis, and hypodermis), as well as deeper tissues (fat, connective tissue and muscle) were infiltrated with a small amount of a short-acting local anesthetic, loaded on a 10cc syringe with a 25G, 1.5-in  Needle. An appropriate amount of time was allowed for local anesthetics to take effect before proceeding to the next step. Local Anesthetic: Lidocaine 2.0% The unused portion of the local anesthetic was discarded in the proper designated containers. Technical description of process:   L3 Medial Branch Nerve Block (MBB): The target area for the L3 medial branch is at the junction of the postero-lateral aspect of the superior articular process and the superior, posterior, and medial edge of the transverse process of L4. Under fluoroscopic guidance, a Quincke needle was inserted until contact was made with os over the superior postero-lateral aspect of the pedicular shadow (target area). After negative aspiration for blood, 93m of the nerve block solution was injected without difficulty or complication. The needle was removed intact. L4 Medial Branch Nerve Block (MBB): The target area for the L4 medial branch is at the junction of the postero-lateral aspect of the superior articular process and the superior, posterior, and medial edge of the transverse process of L5. Under fluoroscopic guidance, a Quincke needle was inserted until contact was made with os over the superior postero-lateral aspect of the pedicular shadow (target area). After negative aspiration for blood,259mof the nerve block solution was  injected without difficulty or complication. The needle was removed intact. L5 Medial Branch Nerve Block (MBB): The target area for the L5 medial branch is at the junction of the postero-lateral aspect of the superior articular process and the superior, posterior, and medial edge of the sacral ala. Under fluoroscopic guidance, a Quincke needle was inserted until contact was made with os over the superior postero-lateral aspect of the pedicular shadow (target area). After negative aspiration for blood, 83m25mf the nerve block solution was injected without difficulty or complication. The needle was removed intact.   12 cc solution made of 10 cc of 0.2% ropivacaine, 2 cc of Decadron 10 mg/cc.  2 cc injected at each level above bilaterally.    Once the entire procedure was completed, the treated area was cleaned, making sure to leave some of the prepping solution back to take advantage of its long term bactericidal properties.         Illustration of the posterior view of the lumbar spine and the posterior neural structures. Laminae of L2 through S1 are labeled. DPRL5, dorsal primary ramus of L5; DPRS1, dorsal primary ramus of S1; DPR3, dorsal primary ramus of L3; FJ, facet (zygapophyseal) joint L3-L4; I, inferior articular process of L4; LB1, lateral branch of dorsal primary ramus of L1; IAB, inferior articular branches from L3 medial branch (supplies L4-L5 facet joint); IBP, intermediate branch plexus; MB3, medial branch of dorsal primary ramus of L3; NR3, third lumbar nerve root; S, superior articular process of L5; SAB, superior articular branches from L4 (supplies L4-5 facet joint also); TP3, transverse process of L3.  Vitals:   05/14/22 1035 05/14/22 1040 05/14/22 1045 05/14/22 1051  BP: (!) 137/104 (!) 140/90 (!) 138/91 (!) 141/99  Pulse: 86 84 80 81  Resp: '16 15 16 17  '$ Temp:      SpO2: 99% 100% 98% 96%  Weight:  Height:         Start Time: 1037 hrs. End Time: 1046 hrs.  Imaging  Guidance (Spinal):          Type of Imaging Technique: Fluoroscopy Guidance (Spinal) Indication(s): Assistance in needle guidance and placement for procedures requiring needle placement in or near specific anatomical locations not easily accessible without such assistance. Exposure Time: Please see nurses notes. Contrast: None used. Fluoroscopic Guidance: I was personally present during the use of fluoroscopy. "Tunnel Vision Technique" used to obtain the best possible view of the target area. Parallax error corrected before commencing the procedure. "Direction-depth-direction" technique used to introduce the needle under continuous pulsed fluoroscopy. Once target was reached, antero-posterior, oblique, and lateral fluoroscopic projection used confirm needle placement in all planes. Images permanently stored in EMR. Interpretation: No contrast injected. I personally interpreted the imaging intraoperatively. Adequate needle placement confirmed in multiple planes. Permanent images saved into the patient's record.  Antibiotic Prophylaxis:   Anti-infectives (From admission, onward)    None      Indication(s): None identified  Post-operative Assessment:  Post-procedure Vital Signs:  Pulse/HCG Rate: 81  Temp: (!) 97.3 F (36.3 C) Resp: 17 BP: (!) 141/99 SpO2: 96 %  EBL: None  Complications: No immediate post-treatment complications observed by team, or reported by patient.  Note: The patient tolerated the entire procedure well. A repeat set of vitals were taken after the procedure and the patient was kept under observation following institutional policy, for this type of procedure. Post-procedural neurological assessment was performed, showing return to baseline, prior to discharge. The patient was provided with post-procedure discharge instructions, including a section on how to identify potential problems. Should any problems arise concerning this procedure, the patient was given  instructions to immediately contact us, at any time, without hesitation. In any case, we plan to contact the patient by telephone for a follow-up status report regarding this interventional procedure.  Comments:  No additional relevant information.  Plan of Care  Orders:  Orders Placed This Encounter  Procedures   [facility] C-ARM 1-60 MIN NO REPORT    Intraoperative interpretation by procedural physician at [facility].    Standing Status:   Standing    Number of Occurrences:   1    Order Specific Question:   Reason for exam:    Answer:   Assistance in needle guidance and placement for procedures requiring needle placement in or near specific anatomical locations not easily accessible without such assistance.     Medications ordered for procedure: Meds ordered this encounter  Medications   lidocaine (XYLOCAINE) 2 % (with pres) injection 400 mg   dexamethasone (DECADRON) injection 10 mg   dexamethasone (DECADRON) injection 10 mg   ropivacaine (PF) 2 mg/mL (0.2%) (NAROPIN) injection 9 mL   ropivacaine (PF) 2 mg/mL (0.2%) (NAROPIN) injection 9 mL   Medications administered: We administered lidocaine, dexamethasone, dexamethasone, ropivacaine (PF) 2 mg/mL (0.2%), and ropivacaine (PF) 2 mg/mL (0.2%).  See the medical record for exact dosing, route, and time of administration.  Follow-up plan:   Return in about 4 weeks (around 06/11/2022) for Post Procedure Evaluation, virtual.     Recent Visits Date Type Provider Dept  03/29/22 Office Visit Gillis Santa, MD [facility]  03/05/22 Office Visit Gillis Santa, MD [facility]  Showing recent visits within past 90 days and meeting all other requirements Today's Visits Date Type Provider Dept  05/14/22 Procedure visit Gillis Santa, MD [facility]  Showing today's  visits and meeting all other requirements Future Appointments Date Type Provider Dept  06/11/22 Appointment Gillis Santa, MD  [facility]  Showing future appointments within next 90 days and meeting all other requirements  Disposition: Discharge home  Discharge (Date  Time): 05/14/2022; 1054 hrs.   Primary Care Physician: Susy Frizzle, MD Location: [facility] Note by: Gillis Santa, MD Date: 05/14/2022; Time: 11:36 AM  Disclaimer:  Medicine is not an exact science. The only guarantee in medicine is that nothing is guaranteed. It is important to note that the decision to proceed with this intervention was based on the information collected from the patient. The Data and conclusions were drawn from the patient's questionnaire, the interview, and the physical examination. Because the information was provided in large part by the patient, it cannot be guaranteed that it has not been purposely or unconsciously manipulated. Every effort has been made to obtain as much relevant data as possible for this evaluation. It is important to note that the conclusions that lead to this procedure are derived in large part from the available data. Always take into account that the treatment will also be dependent on availability of resources and existing treatment guidelines, considered by other Pain Management Practitioners as being common knowledge and practice, at the time of the intervention. For Medico-Legal purposes, it is also important to point out that variation in procedural techniques and pharmacological choices are the acceptable norm. The indications, contraindications, technique, and results of the above procedure should only be interpreted and judged by a Board-Certified Interventional Pain Specialist with extensive familiarity and expertise in the same exact procedure and technique.

## 2022-05-14 NOTE — Progress Notes (Signed)
Nursing Pain Medication Assessment:  Safety precautions to be maintained throughout the outpatient stay will include: orient to surroundings, keep bed in low position, maintain call bell within reach at all times, provide assistance with transfer out of bed and ambulation.  Medication Inspection Compliance: Pill count conducted under aseptic conditions, in front of the patient. Neither the pills nor the bottle was removed from the patient's sight at any time. Once count was completed pills were immediately returned to the patient in their original bottle.  Medication: Tramadol (Ultram) Pill/Patch Count:  46 of 90 pills remain Pill/Patch Appearance: Markings consistent with prescribed medication Bottle Appearance: Standard pharmacy container. Clearly labeled. Filled Date: 10 / 5 / 2023 Last Medication intake:  TodaySafety precautions to be maintained throughout the outpatient stay will include: orient to surroundings, keep bed in low position, maintain call bell within reach at all times, provide assistance with transfer out of bed and ambulation.

## 2022-05-14 NOTE — Progress Notes (Deleted)
Safety precautions to be maintained throughout the outpatient stay will include: orient to surroundings, keep bed in low position, maintain call bell within reach at all times, provide assistance with transfer out of bed and ambulation.  

## 2022-05-14 NOTE — Patient Instructions (Signed)

## 2022-05-15 ENCOUNTER — Telehealth: Payer: Self-pay | Admitting: *Deleted

## 2022-05-15 NOTE — Telephone Encounter (Signed)
No problems post procedure. 

## 2022-05-21 ENCOUNTER — Other Ambulatory Visit: Payer: Self-pay

## 2022-05-21 ENCOUNTER — Telehealth: Payer: Self-pay | Admitting: Student in an Organized Health Care Education/Training Program

## 2022-05-21 DIAGNOSIS — M5412 Radiculopathy, cervical region: Secondary | ICD-10-CM

## 2022-05-21 DIAGNOSIS — G894 Chronic pain syndrome: Secondary | ICD-10-CM

## 2022-05-21 DIAGNOSIS — G5793 Unspecified mononeuropathy of bilateral lower limbs: Secondary | ICD-10-CM

## 2022-05-21 DIAGNOSIS — M549 Dorsalgia, unspecified: Secondary | ICD-10-CM

## 2022-05-21 MED ORDER — NORTRIPTYLINE HCL 50 MG PO CAPS: 50.0000 mg | ORAL_CAPSULE | Freq: Every day | ORAL | 5 refills | Status: DC

## 2022-05-21 NOTE — Telephone Encounter (Signed)
Refill request sent to Dr Lateef.  

## 2022-05-21 NOTE — Telephone Encounter (Signed)
PT stated that she need her Nortriptyline to be send to [facility] in [place]. Please give patient a call. Thanks

## 2022-06-11 ENCOUNTER — Encounter: Payer: Self-pay | Admitting: Student in an Organized Health Care Education/Training Program

## 2022-06-11 ENCOUNTER — Ambulatory Visit
Payer: 59 | Attending: Student in an Organized Health Care Education/Training Program | Admitting: Student in an Organized Health Care Education/Training Program

## 2022-06-11 DIAGNOSIS — M47816 Spondylosis without myelopathy or radiculopathy, lumbar region: Secondary | ICD-10-CM | POA: Diagnosis not present

## 2022-06-11 DIAGNOSIS — M5136 Other intervertebral disc degeneration, lumbar region: Secondary | ICD-10-CM | POA: Diagnosis not present

## 2022-06-11 DIAGNOSIS — G894 Chronic pain syndrome: Secondary | ICD-10-CM | POA: Diagnosis not present

## 2022-06-11 NOTE — Progress Notes (Signed)
Patient: Erica Walters  Service Category: E/M  Provider: Gillis Santa, MD  DOB: 18-Feb-1966  DOS: 06/11/2022  Location: Office  MRN: [id number]  Setting: Ambulatory outpatient  Referring Provider: Susy Frizzle, MD  Type: Established Patient  Specialty: Interventional Pain Management  PCP: Susy Frizzle, MD  Location: Remote location  Delivery: TeleHealth     Virtual Encounter - Pain Management PROVIDER NOTE: Information contained herein reflects review and annotations entered in association with encounter. Interpretation of such information and data should be left to medically-trained personnel. Information provided to patient can be located elsewhere in the medical record under "Patient Instructions". Document created using STT-dictation technology, any transcriptional errors that may result from process are unintentional.    Contact & Pharmacy Preferred: [phone] Home: [phone] (home) Mobile: [phone] (mobile) E-mail: [email or web address] .[facility] [address] [place] [address] Phone: [phone] Fax: [phone]   Pre-screening  Ms. Grissom offered "in-person" vs "virtual" encounter. She indicated preferring virtual for this encounter.   Reason COVID-19*  Social distancing based on CDC and AMA recommendations.   I contacted Lis Savitt on 06/11/2022 via telephone.      I clearly identified myself as Gillis Santa, MD. I verified that I was speaking with the correct person using two identifiers (Name: Marielys Trinidad, and date of birth: 04-Jun-1966).  Consent I sought verbal advanced consent from [name] for virtual visit interactions. I informed Ms. Rice of possible security and privacy concerns, risks, and limitations associated with providing "not-in-person" medical evaluation and management services. I also informed Ms. Wismer of the availability of "in-person"  appointments. Finally, I informed her that there would be a charge for the virtual visit and that she could be  personally, fully or partially, financially responsible for it. Ms. Corriveau expressed understanding and agreed to proceed.   Historic Elements   Ms. Pearson Reasons is a 56 y.o. year old, female patient evaluated today after our last contact on 05/21/2022. Ms. Mezo  has a past medical history of Allergy, Arthritis, and Hyperlipidemia. She also  has a past surgical history that includes Total abdominal hysterectomy w/ bilateral salpingoophorectomy and Colonoscopy (12/04/2017). Ms. Bove has a current medication list which includes the following prescription(s): cyclobenzaprine, ibuprofen, nortriptyline, pregabalin, and tramadol. She  reports that she has been smoking cigarettes. She has a 13.00 pack-year smoking history. She has never used smokeless tobacco. She reports current alcohol use of about 14.0 standard drinks of alcohol per week. She reports that she does not use drugs. Ms. Suen has No Known Allergies.  Estimated body mass index is 31.32 kg/m as calculated from the following:   Height as of 05/14/22: 5' 7" (1.702 m).   Weight as of 05/14/22: 200 lb (90.7 kg).  HPI  Today, she is being contacted for a post-procedure assessment.   Post-procedure evaluation   Type: Lumbar Facet, Medial Branch Block(s) #1  Laterality: Bilateral  Level: L3, L4, L5, Medial Branch Level(s). Injecting these levels blocks the L3-4 and L4-5 lumbar facet joints. Imaging: Fluoroscopic guidance         Anesthesia: Local anesthesia (1-2% Lidocaine) DOS: 05/14/2022 Performed by: Gillis Santa, MD  Primary Purpose: Diagnostic/Therapeutic Indications: Low back pain severe enough to impact quality of life or function. 1. Lumbar facet arthropathy   2. Lumbar degenerative disc disease   3. Lumbar disc herniation with radiculopathy   4. Chronic pain syndrome    NAS-11  Pain score:    Pre-procedure: 3 /10   Post-procedure: 3 /10      Effectiveness:  Initial hour after procedure: 0 %  Subsequent 4-6 hours post-procedure: 0 %  Analgesia past initial 6 hours: 90 % (lasting 3 weeks)  Ongoing improvement:  Analgesic:  60% Function: Back to baseline ROM: Back to baseline   Laboratory Chemistry Profile   Renal Lab Results  Component Value Date   BUN 11 01/23/2019   CREATININE 0.81 01/23/2019   BCR NOT APPLICABLE 01/23/2019   GFRAA 97 01/23/2019   GFRNONAA 84 01/23/2019    Hepatic Lab Results  Component Value Date   AST 44 (H) 01/23/2019   ALT 62 (H) 01/23/2019   ALBUMIN 4.1 07/19/2015   ALKPHOS 83 07/19/2015   LIPASE 30 07/19/2015    Electrolytes Lab Results  Component Value Date   NA 141 01/23/2019   K 4.8 01/23/2019   CL 106 01/23/2019   CALCIUM 9.7 01/23/2019    Bone Lab Results  Component Value Date   VD25OH 31 01/23/2019    Inflammation (CRP: Acute Phase) (ESR: Chronic Phase) Lab Results  Component Value Date   CRP 4.7 12/25/2018   ESRSEDRATE 14 12/25/2018         Note: Above Lab results reviewed.  Assessment  The primary encounter diagnosis was Lumbar facet arthropathy. Diagnoses of Lumbar degenerative disc disease and Chronic pain syndrome were also pertinent to this visit.  Plan of Care  Positive response to first diagnostic lumbar facet medial branch nerve block.  We discussed repeating second set and if similar or better results, we will plan for lumbar radiofrequency ablation for the purpose of obtaining longer-term pain relief.  Risk and benefits reviewed and patient would like to proceed.  1. Lumbar facet arthropathy - LUMBAR FACET(MEDIAL BRANCH NERVE BLOCK) MBNB; Future  2. Lumbar degenerative disc disease - LUMBAR FACET(MEDIAL BRANCH NERVE BLOCK) MBNB; Future  3. Chronic pain syndrome - LUMBAR FACET(MEDIAL BRANCH NERVE BLOCK) MBNB; Future    Orders:  Orders Placed This Encounter  Procedures   LUMBAR FACET(MEDIAL  BRANCH NERVE BLOCK) MBNB    Standing Status:   Future    Standing Expiration Date:   09/10/2022    Scheduling Instructions:     Procedure: Lumbar facet block (AKA.: Lumbosacral medial branch nerve block)     Side: Bilateral     Level: L3-4 & L5-S1 Facets ( L3, L4, L5, Medial Branch Nerves)     Sedation: local, no sedation     Timeframe: ASAA    Order Specific Question:   Where will this procedure be performed?    Answer:   [facility]   Follow-up plan:   Return in about 16 days (around 06/27/2022) for B/L L3, 4, 5 MBNB #2, in clinic NS.    Recent Visits Date Type Provider Dept  05/14/22 Procedure visit Lateef, Bilal, MD [facility]  03/29/22 Office Visit Lateef, Bilal, MD [facility]  Showing recent visits within past 90 days and meeting all other requirements Today's Visits Date Type Provider Dept  06/11/22 Office Visit Lateef, Bilal, MD [facility]  Showing today's visits and meeting all other requirements Future Appointments No visits were found meeting these conditions. Showing future appointments within next 90 days and meeting all other requirements  I discussed the assessment and treatment plan with the patient. The patient was provided an opportunity to ask questions and all were answered. The patient agreed with the plan and demonstrated an   understanding of the instructions.  Patient advised to call back or seek an in-person evaluation if the symptoms or condition worsens.  Duration of encounter: 20minutes.  Note by: Bilal Lateef, MD Date: 06/11/2022; Time: 3:46 PM 

## 2022-07-17 ENCOUNTER — Other Ambulatory Visit: Payer: Self-pay | Admitting: Family Medicine

## 2022-07-17 DIAGNOSIS — Z1231 Encounter for screening mammogram for malignant neoplasm of breast: Secondary | ICD-10-CM

## 2022-07-18 ENCOUNTER — Ambulatory Visit
Admission: RE | Admit: 2022-07-18 | Discharge: 2022-07-18 | Disposition: A | Discharge status: Home / Self Care | Payer: 59 | Source: Ambulatory Visit | Attending: Student in an Organized Health Care Education/Training Program | Admitting: Student in an Organized Health Care Education/Training Program

## 2022-07-18 ENCOUNTER — Encounter: Payer: Self-pay | Admitting: Student in an Organized Health Care Education/Training Program

## 2022-07-18 ENCOUNTER — Ambulatory Visit
Payer: 59 | Attending: Student in an Organized Health Care Education/Training Program | Admitting: Student in an Organized Health Care Education/Training Program

## 2022-07-18 DIAGNOSIS — M47816 Spondylosis without myelopathy or radiculopathy, lumbar region: Secondary | ICD-10-CM

## 2022-07-18 DIAGNOSIS — M5136 Other intervertebral disc degeneration, lumbar region: Secondary | ICD-10-CM | POA: Insufficient documentation

## 2022-07-18 DIAGNOSIS — G894 Chronic pain syndrome: Secondary | ICD-10-CM | POA: Insufficient documentation

## 2022-07-18 MED ORDER — ROPIVACAINE HCL 2 MG/ML IJ SOLN
9.0000 mL | Freq: Once | INTRAMUSCULAR | Status: AC
  Administered 2022-07-18: 9 mL via PERINEURAL

## 2022-07-18 MED ORDER — LIDOCAINE HCL 2 % IJ SOLN
20.0000 mL | Freq: Once | INTRAMUSCULAR | Status: AC
  Administered 2022-07-18: 100 mg

## 2022-07-18 MED ORDER — LIDOCAINE HCL (PF) 2 % IJ SOLN
INTRAMUSCULAR | Status: AC
  Filled 2022-07-18: qty 10

## 2022-07-18 MED ORDER — ROPIVACAINE HCL 2 MG/ML IJ SOLN
INTRAMUSCULAR | Status: AC
  Filled 2022-07-18: qty 20

## 2022-07-18 MED ORDER — DEXAMETHASONE SODIUM PHOSPHATE 10 MG/ML IJ SOLN
10.0000 mg | Freq: Once | INTRAMUSCULAR | Status: AC
  Administered 2022-07-18: 10 mg

## 2022-07-18 MED ORDER — DEXAMETHASONE SODIUM PHOSPHATE 10 MG/ML IJ SOLN
INTRAMUSCULAR | Status: AC
  Filled 2022-07-18: qty 2

## 2022-07-18 NOTE — Progress Notes (Signed)
PROVIDER NOTE: Interpretation of information contained herein should be left to medically-trained personnel. Specific patient instructions are provided elsewhere under "Patient Instructions" section of medical record. This document was created in part using STT-dictation technology, any transcriptional errors that may result from this process are unintentional.  Patient: Erica Walters Type: Established DOB: 24-May-1966 MRN: [id number] PCP: Susy Frizzle, MD  Service: Procedure DOS: 07/18/2022 Setting: Ambulatory Location: Ambulatory outpatient facility Delivery: Face-to-face Provider: Gillis Santa, MD Specialty: Interventional Pain Management Specialty designation: 09 Location: Outpatient facility Ref. Prov.: Gillis Santa, MD    Procedure:           Type: Lumbar Facet, Medial Branch Block(s) #2  Laterality: Bilateral  Level: L3, L4, L5, Medial Branch Level(s). Injecting these levels blocks the L3-4 and L4-5 lumbar facet joints. Imaging: Fluoroscopic guidance         Anesthesia: Local anesthesia (1-2% Lidocaine) DOS: 07/18/2022 Performed by: Gillis Santa, MD  Primary Purpose: Diagnostic/Therapeutic Indications: Low back pain severe enough to impact quality of life or function. 1. Lumbar facet arthropathy   2. Lumbar degenerative disc disease   3. Chronic pain syndrome    NAS-11 Pain score:   Pre-procedure: 3 /10   Post-procedure: 0-No pain/10     Position / Prep / Materials:  Position: Prone  Prep solution: DuraPrep (Iodine Povacrylex [0.7% available iodine] and Isopropyl Alcohol, 74% w/w) Area Prepped: Posterolateral Lumbosacral Spine (Wide prep: From the lower border of the scapula down to the end of the tailbone and from flank to flank.)  Materials:  Tray: Block Needle(s):  Type: Spinal  Gauge (G): 22  Length: 5-in Qty: 2     Pre-op H&P Assessment:  Ms. Erica Walters is a 57 y.o. (year old), female patient, seen today for interventional treatment. She  has a past  surgical history that includes Total abdominal hysterectomy w/ bilateral salpingoophorectomy and Colonoscopy (12/04/2017). Ms. Erica Walters has a current medication list which includes the following prescription(s): cyclobenzaprine, ibuprofen, nortriptyline, pregabalin, and tramadol. Her primarily concern today is the Back Pain (lower)  Initial Vital Signs:  Pulse/HCG Rate: 88ECG Heart Rate: 91 Temp: (!) 97.5 F (36.4 C) Resp: 18 BP: (!) 136/95 SpO2: 98 %  BMI: Estimated body mass index is 31.8 kg/m as calculated from the following:   Height as of this encounter: 5' 6.5" (1.689 m).   Weight as of this encounter: 200 lb (90.7 kg).  Risk Assessment: Allergies: Reviewed. She has No Known Allergies.  Allergy Precautions: None required Coagulopathies: Reviewed. None identified.  Blood-thinner therapy: None at this time Active Infection(s): Reviewed. None identified. Ms. Erica Walters is afebrile  Site Confirmation: Ms. Erica Walters was asked to confirm the procedure and laterality before marking the site Procedure checklist: Completed Consent: Before the procedure and under the influence of no sedative(s), amnesic(s), or anxiolytics, the patient was informed of the treatment options, risks and possible complications. To fulfill our ethical and legal obligations, as recommended by the American Medical Association's Code of Ethics, I have informed the patient of my clinical impression; the nature and purpose of the treatment or procedure; the risks, benefits, and possible complications of the intervention; the alternatives, including doing nothing; the risk(s) and benefit(s) of the alternative treatment(s) or procedure(s); and the risk(s) and benefit(s) of doing nothing. The patient was provided information about the general risks and possible complications associated with the procedure. These may include, but are not limited to: failure to achieve desired goals, infection, bleeding, organ or nerve damage,  allergic reactions, paralysis, and death. In addition,  the patient was informed of those risks and complications associated to Spine-related procedures, such as failure to decrease pain; infection (i.e.: Meningitis, epidural or intraspinal abscess); bleeding (i.e.: epidural hematoma, subarachnoid hemorrhage, or any other type of intraspinal or peri-dural bleeding); organ or nerve damage (i.e.: Any type of peripheral nerve, nerve root, or spinal cord injury) with subsequent damage to sensory, motor, and/or autonomic systems, resulting in permanent pain, numbness, and/or weakness of one or several areas of the body; allergic reactions; (i.e.: anaphylactic reaction); and/or death. Furthermore, the patient was informed of those risks and complications associated with the medications. These include, but are not limited to: allergic reactions (i.e.: anaphylactic or anaphylactoid reaction(s)); adrenal axis suppression; blood sugar elevation that in diabetics may result in ketoacidosis or comma; water retention that in patients with history of congestive heart failure may result in shortness of breath, pulmonary edema, and decompensation with resultant heart failure; weight gain; swelling or edema; medication-induced neural toxicity; particulate matter embolism and blood vessel occlusion with resultant organ, and/or nervous system infarction; and/or aseptic necrosis of one or more joints. Finally, the patient was informed that Medicine is not an exact science; therefore, there is also the possibility of unforeseen or unpredictable risks and/or possible complications that may result in a catastrophic outcome. The patient indicated having understood very clearly. We have given the patient no guarantees and we have made no promises. Enough time was given to the patient to ask questions, all of which were answered to the patient's satisfaction. Ms. Erica Walters has indicated that she wanted to continue with the  procedure. Attestation: I, the ordering provider, attest that I have discussed with the patient the benefits, risks, side-effects, alternatives, likelihood of achieving goals, and potential problems during recovery for the procedure that I have provided informed consent. Date  Time: 07/18/2022  9:21 AM  Pre-Procedure Preparation:  Monitoring: As per clinic protocol. Respiration, ETCO2, SpO2, BP, heart rate and rhythm monitor placed and checked for adequate function Safety Precautions: Patient was assessed for positional comfort and pressure points before starting the procedure. Time-out: I initiated and conducted the "Time-out" before starting the procedure, as per protocol. The patient was asked to participate by confirming the accuracy of the "Time Out" information. Verification of the correct person, site, and procedure were performed and confirmed by me, the nursing staff, and the patient. "Time-out" conducted as per Joint Commission's Universal Protocol (UP.01.01.01). Time: 2297  Description of Procedure:          Laterality: Bilateral. The procedure was performed in identical fashion on both sides. Targeted Levels:  L3, L4, L5, Medial Branch Level(s)  Safety Precautions: Aspiration looking for blood return was conducted prior to all injections. At no point did we inject any substances, as a needle was being advanced. Before injecting, the patient was told to immediately notify me if she was experiencing any new onset of "ringing in the ears, or metallic taste in the mouth". No attempts were made at seeking any paresthesias. Safe injection practices and needle disposal techniques used. Medications properly checked for expiration dates. SDV (single dose vial) medications used. After the completion of the procedure, all disposable equipment used was discarded in the proper designated medical waste containers. Local Anesthesia: Protocol guidelines were followed. The patient was positioned over the  fluoroscopy table. The area was prepped in the usual manner. The time-out was completed. The target area was identified using fluoroscopy. A 12-in long, straight, sterile hemostat was used with fluoroscopic guidance to locate the targets for  each level blocked. Once located, the skin was marked with an approved surgical skin marker. Once all sites were marked, the skin (epidermis, dermis, and hypodermis), as well as deeper tissues (fat, connective tissue and muscle) were infiltrated with a small amount of a short-acting local anesthetic, loaded on a 10cc syringe with a 25G, 1.5-in  Needle. An appropriate amount of time was allowed for local anesthetics to take effect before proceeding to the next step. Local Anesthetic: Lidocaine 2.0% The unused portion of the local anesthetic was discarded in the proper designated containers. Technical description of process:   L3 Medial Branch Nerve Block (MBB): The target area for the L3 medial branch is at the junction of the postero-lateral aspect of the superior articular process and the superior, posterior, and medial edge of the transverse process of L4. Under fluoroscopic guidance, a Quincke needle was inserted until contact was made with os over the superior postero-lateral aspect of the pedicular shadow (target area). After negative aspiration for blood, 28m of the nerve block solution was injected without difficulty or complication. The needle was removed intact. L4 Medial Branch Nerve Block (MBB): The target area for the L4 medial branch is at the junction of the postero-lateral aspect of the superior articular process and the superior, posterior, and medial edge of the transverse process of L5. Under fluoroscopic guidance, a Quincke needle was inserted until contact was made with os over the superior postero-lateral aspect of the pedicular shadow (target area). After negative aspiration for blood,219mof the nerve block solution was injected without difficulty or  complication. The needle was removed intact. L5 Medial Branch Nerve Block (MBB): The target area for the L5 medial branch is at the junction of the postero-lateral aspect of the superior articular process and the superior, posterior, and medial edge of the sacral ala. Under fluoroscopic guidance, a Quincke needle was inserted until contact was made with os over the superior postero-lateral aspect of the pedicular shadow (target area). After negative aspiration for blood, 70m11mf the nerve block solution was injected without difficulty or complication. The needle was removed intact.   12 cc solution made of 10 cc of 0.2% ropivacaine, 2 cc of Decadron 10 mg/cc.  2 cc injected at each level above bilaterally.    Once the entire procedure was completed, the treated area was cleaned, making sure to leave some of the prepping solution back to take advantage of its long term bactericidal properties.         Illustration of the posterior view of the lumbar spine and the posterior neural structures. Laminae of L2 through S1 are labeled. DPRL5, dorsal primary ramus of L5; DPRS1, dorsal primary ramus of S1; DPR3, dorsal primary ramus of L3; FJ, facet (zygapophyseal) joint L3-L4; I, inferior articular process of L4; LB1, lateral branch of dorsal primary ramus of L1; IAB, inferior articular branches from L3 medial branch (supplies L4-L5 facet joint); IBP, intermediate branch plexus; MB3, medial branch of dorsal primary ramus of L3; NR3, third lumbar nerve root; S, superior articular process of L5; SAB, superior articular branches from L4 (supplies L4-5 facet joint also); TP3, transverse process of L3.  Vitals:   07/18/22 0946 07/18/22 0952 07/18/22 0957 07/18/22 1001  BP: 124/70 (!) 139/96 (!) 126/98 (!) 139/92  Pulse:      Resp: '18 18 18 16  '$ Temp:      TempSrc:      SpO2: 97% 96% 95% 96%  Weight:      Height:  Start Time: 0952 hrs. End Time: 1001 hrs.  Imaging Guidance (Spinal):           Type of Imaging Technique: Fluoroscopy Guidance (Spinal) Indication(s): Assistance in needle guidance and placement for procedures requiring needle placement in or near specific anatomical locations not easily accessible without such assistance. Exposure Time: Please see nurses notes. Contrast: None used. Fluoroscopic Guidance: I was personally present during the use of fluoroscopy. "Tunnel Vision Technique" used to obtain the best possible view of the target area. Parallax error corrected before commencing the procedure. "Direction-depth-direction" technique used to introduce the needle under continuous pulsed fluoroscopy. Once target was reached, antero-posterior, oblique, and lateral fluoroscopic projection used confirm needle placement in all planes. Images permanently stored in EMR. Interpretation: No contrast injected. I personally interpreted the imaging intraoperatively. Adequate needle placement confirmed in multiple planes. Permanent images saved into the patient's record.  Antibiotic Prophylaxis:   Anti-infectives (From admission, onward)    None      Indication(s): None identified  Post-operative Assessment:  Post-procedure Vital Signs:  Pulse/HCG Rate: 8889 Temp: (!) 97.5 F (36.4 C) Resp: 16 BP: (!) 139/92 SpO2: 96 %  EBL: None  Complications: No immediate post-treatment complications observed by team, or reported by patient.  Note: The patient tolerated the entire procedure well. A repeat set of vitals were taken after the procedure and the patient was kept under observation following institutional policy, for this type of procedure. Post-procedural neurological assessment was performed, showing return to baseline, prior to discharge. The patient was provided with post-procedure discharge instructions, including a section on how to identify potential problems. Should any problems arise concerning this procedure, the patient was given instructions to immediately contact  us, at any time, without hesitation. In any case, we plan to contact the patient by telephone for a follow-up status report regarding this interventional procedure.  Comments:  No additional relevant information.  Plan of Care  Orders:  Orders Placed This Encounter  Procedures   DG PAIN [facility] C-ARM 1-60 MIN NO REPORT    Intraoperative interpretation by procedural physician at [facility].    Standing Status:   Standing    Number of Occurrences:   1    Order Specific Question:   Reason for exam:    Answer:   Assistance in needle guidance and placement for procedures requiring needle placement in or near specific anatomical locations not easily accessible without such assistance.     Medications ordered for procedure: Meds ordered this encounter  Medications   lidocaine (XYLOCAINE) 2 % (with pres) injection 400 mg   dexamethasone (DECADRON) injection 10 mg   dexamethasone (DECADRON) injection 10 mg   ropivacaine (PF) 2 mg/mL (0.2%) (NAROPIN) injection 9 mL   ropivacaine (PF) 2 mg/mL (0.2%) (NAROPIN) injection 9 mL   Medications administered: We administered lidocaine, dexamethasone, dexamethasone, ropivacaine (PF) 2 mg/mL (0.2%), and ropivacaine (PF) 2 mg/mL (0.2%).  See the medical record for exact dosing, route, and time of administration.  Follow-up plan:   Return in about 3 weeks (around 08/08/2022) for Post Procedure Evaluation, virtual (sch RFA).     Recent Visits Date Type Provider Dept  06/11/22 Office Visit Gillis Santa, MD [facility]  05/14/22 Procedure visit Gillis Santa, MD [facility]  Showing recent visits within past 90 days and meeting all other requirements Today's Visits Date Type Provider Dept  07/18/22 Procedure visit Gillis Santa, MD [facility]  Showing today's visits and meeting all other requirements Future Appointments  No visits were found meeting these conditions. Showing future appointments within next  90 days and meeting all other requirements  Disposition: Discharge home  Discharge (Date  Time): 07/18/2022;   hrs.   Primary Care Physician: Susy Frizzle, MD Location: [facility] Note by: Gillis Santa, MD Date: 07/18/2022; Time: 10:06 AM  Disclaimer:  Medicine is not an exact science. The only guarantee in medicine is that nothing is guaranteed. It is important to note that the decision to proceed with this intervention was based on the information collected from the patient. The Data and conclusions were drawn from the patient's questionnaire, the interview, and the physical examination. Because the information was provided in large part by the patient, it cannot be guaranteed that it has not been purposely or unconsciously manipulated. Every effort has been made to obtain as much relevant data as possible for this evaluation. It is important to note that the conclusions that lead to this procedure are derived in large part from the available data. Always take into account that the treatment will also be dependent on availability of resources and existing treatment guidelines, considered by other Pain Management Practitioners as being common knowledge and practice, at the time of the intervention. For Medico-Legal purposes, it is also important to point out that variation in procedural techniques and pharmacological choices are the acceptable norm. The indications, contraindications, technique, and results of the above procedure should only be interpreted and judged by a Board-Certified Interventional Pain Specialist with extensive familiarity and expertise in the same exact procedure and technique.

## 2022-07-18 NOTE — Patient Instructions (Signed)
Pain Management Discharge Instructions  General Discharge Instructions :  If you need to reach your doctor call: Monday-Friday 8:00 am - 4:00 pm at [phone] or toll free [phone].  After clinic hours [phone] to have operator reach doctor.  Bring all of your medication bottles to all your appointments in the pain clinic.  To cancel or reschedule your appointment with Pain Management please remember to call 24 hours in advance to avoid a fee.  Refer to the educational materials which you have been given on: General Risks, I had my Procedure. Discharge Instructions, Post Sedation.  Post Procedure Instructions:  The drugs you were given will stay in your system until tomorrow, so for the next 24 hours you should not drive, make any legal decisions or drink any alcoholic beverages.  You may eat anything you prefer, but it is better to start with liquids then soups and crackers, and gradually work up to solid foods.  Please notify your doctor immediately if you have any unusual bleeding, trouble breathing or pain that is not related to your normal pain.  Depending on the type of procedure that was done, some parts of your body may feel week and/or numb.  This usually clears up by tonight or the next day.  Walk with the use of an assistive device or accompanied by an adult for the 24 hours.  You may use ice on the affected area for the first 24 hours.  Put ice in a Ziploc bag and cover with a towel and place against area 15 minutes on 15 minutes off.  You may switch to heat after 24 hours.Facet Blocks Patient Information  Description: The facets are joints in the spine between the vertebrae.  Like any joints in the body, facets can become irritated and painful.  Arthritis can also effect the facets.  By injecting steroids and local anesthetic in and around these joints, we can temporarily block the nerve supply to them.  Steroids act directly on irritated nerves and tissues to  reduce selling and inflammation which often leads to decreased pain.  Facet blocks may be done anywhere along the spine from the neck to the low back depending upon the location of your pain.   After numbing the skin with local anesthetic (like Novocaine), a small needle is passed onto the facet joints under x-ray guidance.  You may experience a sensation of pressure while this is being done.  The entire block usually lasts about 15-25 minutes.   Conditions which may be treated by facet blocks:  Low back/buttock pain Neck/shoulder pain Certain types of headaches  Preparation for the injection:  Do not eat any solid food or dairy products within 8 hours of your appointment. You may drink clear liquid up to 3 hours before appointment.  Clear liquids include water, black coffee, juice or soda.  No milk or cream please. You may take your regular medication, including pain medications, with a sip of water before your appointment.  Diabetics should hold regular insulin (if taken separately) and take 1/2 normal NPH dose the morning of the procedure.  Carry some sugar containing items with you to your appointment. A driver must accompany you and be prepared to drive you home after your procedure. Bring all your current medications with you. An IV may be inserted and sedation may be given at the discretion of the physician. A blood pressure cuff, EKG and other monitors will often be applied during the procedure.  Some patients may need to   have extra oxygen administered for a short period. You will be asked to provide medical information, including your allergies and medications, prior to the procedure.  We must know immediately if you are taking blood thinners (like Coumadin/Warfarin) or if you are allergic to IV iodine contrast (dye).  We must know if you could possible be pregnant.  Possible side-effects:  Bleeding from needle site Infection (rare, may require surgery) Nerve injury (rare) Numbness  & tingling (temporary) Difficulty urinating (rare, temporary) Spinal headache (a headache worse with upright posture) Light-headedness (temporary) Pain at injection site (serveral days) Decreased blood pressure (rare, temporary) Weakness in arm/leg (temporary) Pressure sensation in back/neck (temporary)   Call if you experience:  Fever/chills associated with headache or increased back/neck pain Headache worsened by an upright position New onset, weakness or numbness of an extremity below the injection site Hives or difficulty breathing (go to the emergency room) Inflammation or drainage at the injection site(s) Severe back/neck pain greater than usual New symptoms which are concerning to you  Please note:  Although the local anesthetic injected can often make your back or neck feel good for several hours after the injection, the pain will likely return. It takes 3-7 days for steroids to work.  You may not notice any pain relief for at least one week.  If effective, we will often do a series of 2-3 injections spaced 3-6 weeks apart to maximally decrease your pain.  After the initial series, you may be a candidate for a more permanent nerve block of the facets.  If you have any questions, please call [phone] [facility] 

## 2022-07-18 NOTE — Progress Notes (Signed)
Safety precautions to be maintained throughout the outpatient stay will include: orient to surroundings, keep bed in low position, maintain call bell within reach at all times, provide assistance with transfer out of bed and ambulation.  

## 2022-07-19 ENCOUNTER — Telehealth: Payer: Self-pay

## 2022-07-19 NOTE — Telephone Encounter (Signed)
Post procedure follow up.  Patient states she is doing great.

## 2022-08-09 ENCOUNTER — Encounter: Payer: Self-pay | Admitting: Student in an Organized Health Care Education/Training Program

## 2022-08-13 ENCOUNTER — Encounter: Payer: Self-pay | Admitting: Student in an Organized Health Care Education/Training Program

## 2022-08-13 ENCOUNTER — Ambulatory Visit
Payer: 59 | Attending: Student in an Organized Health Care Education/Training Program | Admitting: Student in an Organized Health Care Education/Training Program

## 2022-08-13 DIAGNOSIS — M47816 Spondylosis without myelopathy or radiculopathy, lumbar region: Secondary | ICD-10-CM

## 2022-08-13 DIAGNOSIS — M5136 Other intervertebral disc degeneration, lumbar region: Secondary | ICD-10-CM

## 2022-08-13 DIAGNOSIS — G894 Chronic pain syndrome: Secondary | ICD-10-CM

## 2022-08-13 NOTE — Progress Notes (Signed)
Patient: Erica Walters  Service Category: E/M  Provider: Gillis Santa, MD  DOB: 08/19/1965  DOS: 08/13/2022  Location: Office  MRN: [id number]  Setting: Ambulatory outpatient  Referring Provider: Susy Frizzle, MD  Type: Established Patient  Specialty: Interventional Pain Management  PCP: Susy Frizzle, MD  Location: Remote location  Delivery: TeleHealth     Virtual Encounter - Pain Management PROVIDER NOTE: Information contained herein reflects review and annotations entered in association with encounter. Interpretation of such information and data should be left to medically-trained personnel. Information provided to patient can be located elsewhere in the medical record under "Patient Instructions". Document created using STT-dictation technology, any transcriptional errors that may result from process are unintentional.    Contact & Pharmacy Preferred: [phone] Home: [phone] (home) Mobile: [phone] (mobile) E-mail: [email or web address] [address], [place] [address] Phone: [phone] Fax: [phone]   Pre-screening  Erica Walters offered "in-person" vs "virtual" encounter. She indicated preferring virtual for this encounter.   Reason COVID-19*  Social distancing based on CDC and AMA recommendations.   I contacted Erica Walters on 08/13/2022 via telephone.      I clearly identified myself as Gillis Santa, MD. I verified that I was speaking with the correct person using two identifiers (Name: Erica Walters, and date of birth: 26-Sep-1965).  Consent I sought verbal advanced consent from Erica Walters for virtual visit interactions. I informed Erica Walters of possible security and privacy concerns, risks, and limitations associated with providing "not-in-person" medical evaluation and management services. I also informed Erica Walters of the availability of "in-person"  appointments. Finally, I informed her that there would be a charge for the virtual visit and that she could be  personally, fully or partially, financially responsible for it. Erica Walters expressed understanding and agreed to proceed.   Historic Elements   Erica Walters is a 57 y.o. year old, female patient evaluated today after our last contact on 07/18/2022. Erica Walters  has a past medical history of Allergy, Arthritis, and Hyperlipidemia. She also  has a past surgical history that includes Total abdominal hysterectomy w/ bilateral salpingoophorectomy and Colonoscopy (12/04/2017). Erica Walters has a current medication list which includes the following prescription(s): cyclobenzaprine, ibuprofen, nortriptyline, pregabalin, and tramadol. She  reports that she has been smoking cigarettes. She has a 13.00 pack-year smoking history. She has never used smokeless tobacco. She reports current alcohol use of about 14.0 standard drinks of alcohol per week. She reports that she does not use drugs. Erica Walters has No Known Allergies.  Estimated body mass index is 31.8 kg/m as calculated from the following:   Height as of 07/18/22: 5' 6.5" (1.689 m).   Weight as of 07/18/22: 200 lb (90.7 kg).  HPI  Today, she is being contacted for a post-procedure assessment.   Post-procedure evaluation   Type: Lumbar Facet, Medial Branch Block(s) #2  Laterality: Bilateral  Level: L3, L4, L5, Medial Branch Level(s). Injecting these levels blocks the L3-4 and L4-5 lumbar facet joints. Imaging: Fluoroscopic guidance         Anesthesia: Local anesthesia (1-2% Lidocaine) DOS: 07/18/2022 Performed by: Gillis Santa, MD  Primary Purpose: Diagnostic/Therapeutic Indications: Low back pain severe enough to impact quality of life or function. 1. Lumbar facet arthropathy   2. Lumbar degenerative disc disease   3. Chronic pain syndrome    NAS-11 Pain score:   Pre-procedure: 3 /10  Post-procedure: 0-No pain/10       Effectiveness:  Initial hour after procedure: 100 %  Subsequent 4-6 hours post-procedure: 100 %  Analgesia past initial 6 hours: 100% for 3 days Ongoing improvement:  Analgesic:  <10% Function: Back to baseline ROM: Back to baseline   Laboratory Chemistry Profile   Renal Lab Results  Component Value Date   BUN 11 01/23/2019   CREATININE 0.81 [id number]   BCR NOT APPLICABLE [id number]   GFRAA 97 01/23/2019   GFRNONAA 84 01/23/2019    Hepatic Lab Results  Component Value Date   AST 44 (H) 01/23/2019   ALT 62 (H) 01/23/2019   ALBUMIN 4.1 07/19/2015   ALKPHOS 83 07/19/2015   LIPASE 30 07/19/2015    Electrolytes Lab Results  Component Value Date   NA 141 01/23/2019   K 4.8 01/23/2019   CL 106 01/23/2019   CALCIUM 9.7 01/23/2019    Bone Lab Results  Component Value Date   VD25OH 31 01/23/2019    Inflammation (CRP: Acute Phase) (ESR: Chronic Phase) Lab Results  Component Value Date   CRP 4.7 12/25/2018   ESRSEDRATE 14 12/25/2018         Note: Above Lab results reviewed.   Assessment  The primary encounter diagnosis was Lumbar facet arthropathy. Diagnoses of Lumbar degenerative disc disease and Chronic pain syndrome were also pertinent to this visit.  Plan of Care  Patient is status post 2 positive diagnostic lumbar facet medial branch nerve blocks.  We discussed lumbar radiofrequency ablation of the medial branch nerves for the purpose of providing longer-term pain relief.  Risk and benefits reviewed and patient would like to proceed.  Orders:  Orders Placed This Encounter  Procedures   Radiofrequency,Lumbar    Standing Status:   Future    Standing Expiration Date:   11/11/2022    Scheduling Instructions:     Side(s): Bilateral     Level(s): L3, L4, L5, Medial Branch Nerve(s)     Sedation: With IV Versed     Scheduling Timeframe: As soon as pre-approved    Order Specific Question:   Where will this procedure be performed?    Answer:   [facility]   Follow-up plan:   Return in about 2 weeks (around 08/27/2022) for Right L3, 4, 5 RFA, in clinic IV Versed.      Recent Visits Date Type Provider Dept  07/18/22 Procedure visit Gillis Santa, MD [facility]  06/11/22 Office Visit Gillis Santa, MD [facility]  Showing recent visits within past 90 days and meeting all other requirements Today's Visits Date Type Provider Dept  08/13/22 Office Visit Gillis Santa, MD [facility]  Showing today's visits and meeting all other requirements Future Appointments No visits were found meeting these conditions. Showing future appointments within next 90 days and meeting all other requirements  I discussed the assessment and treatment plan with the patient. The patient was provided an opportunity to ask questions and all were answered. The patient agreed with the plan and demonstrated an understanding of the instructions.  Patient advised to call back or seek an in-person evaluation if the symptoms or condition worsens.  Duration of encounter: 58mnutes.  Note by: BGillis Santa MD Date: 08/13/2022; Time: 3:31 PM

## 2022-08-14 NOTE — Patient Instructions (Addendum)
Do not eat any solid food at least 6 hours before your procedure appointment. You may have a sip of water with any medications you need to take that morning. Bring someone who can drive you home. Make sure the person is responsible and easily reached for when your procedure if finished. You will not be able to drive for 24 hours.

## 2022-08-28 ENCOUNTER — Other Ambulatory Visit: Payer: Self-pay | Admitting: Student in an Organized Health Care Education/Training Program

## 2022-08-28 ENCOUNTER — Other Ambulatory Visit: Payer: Self-pay | Admitting: Family Medicine

## 2022-08-28 ENCOUNTER — Ambulatory Visit: Payer: 59

## 2022-08-29 ENCOUNTER — Telehealth: Payer: Self-pay | Admitting: Student in an Organized Health Care Education/Training Program

## 2022-08-29 ENCOUNTER — Encounter: Payer: Self-pay | Admitting: Student in an Organized Health Care Education/Training Program

## 2022-08-29 NOTE — Telephone Encounter (Signed)
Patient informed that Tramadol is considered an opioid, due to opioid prescribing regulations, it must be prescribed at an appointment. Offered to transfer her to a Network engineer to possibly get an appointment today or tomorrow, she said she may have to wait until her upcoming procedure appt.

## 2022-08-29 NOTE — Telephone Encounter (Signed)
PT stated that she need refill on Tramadol. PT stated that pharmacy had send over fax, and hadn't heard anything back. Please give patient a call. TY

## 2022-08-30 ENCOUNTER — Ambulatory Visit: Payer: 59

## 2022-08-30 ENCOUNTER — Other Ambulatory Visit: Payer: Self-pay

## 2022-08-30 MED ORDER — TRAMADOL HCL 50 MG PO TABS
50.0000 mg | ORAL_TABLET | Freq: Three times a day (TID) | ORAL | 0 refills | Status: DC | PRN
  Filled 2022-08-30: qty 30, 10d supply, fill #0

## 2022-08-31 NOTE — Telephone Encounter (Signed)
It does not look like you have an appointment scheduled for your medication. I do see that you have appointment set up for Radiofrequency. Please call our office to make a medication appt.

## 2022-09-04 ENCOUNTER — Ambulatory Visit
Admission: RE | Admit: 2022-09-04 | Discharge: 2022-09-04 | Disposition: A | Discharge status: Home / Self Care | Payer: 59 | Source: Ambulatory Visit | Attending: Family Medicine | Admitting: Family Medicine

## 2022-09-04 DIAGNOSIS — Z1231 Encounter for screening mammogram for malignant neoplasm of breast: Secondary | ICD-10-CM

## 2022-09-07 ENCOUNTER — Other Ambulatory Visit: Payer: Self-pay

## 2022-09-10 ENCOUNTER — Encounter: Payer: Self-pay | Admitting: Family Medicine

## 2022-09-11 ENCOUNTER — Other Ambulatory Visit: Payer: Self-pay

## 2022-09-11 DIAGNOSIS — D369 Benign neoplasm, unspecified site: Secondary | ICD-10-CM

## 2022-09-11 DIAGNOSIS — Z1211 Encounter for screening for malignant neoplasm of colon: Secondary | ICD-10-CM

## 2022-09-12 ENCOUNTER — Encounter: Payer: Self-pay | Admitting: Student in an Organized Health Care Education/Training Program

## 2022-09-12 ENCOUNTER — Ambulatory Visit
Payer: 59 | Attending: Student in an Organized Health Care Education/Training Program | Admitting: Student in an Organized Health Care Education/Training Program

## 2022-09-12 ENCOUNTER — Ambulatory Visit
Admission: RE | Admit: 2022-09-12 | Discharge: 2022-09-12 | Disposition: A | Discharge status: Home / Self Care | Payer: 59 | Source: Ambulatory Visit | Attending: Student in an Organized Health Care Education/Training Program | Admitting: Student in an Organized Health Care Education/Training Program

## 2022-09-12 DIAGNOSIS — G894 Chronic pain syndrome: Secondary | ICD-10-CM | POA: Diagnosis present

## 2022-09-12 DIAGNOSIS — M47816 Spondylosis without myelopathy or radiculopathy, lumbar region: Secondary | ICD-10-CM | POA: Insufficient documentation

## 2022-09-12 DIAGNOSIS — M5136 Other intervertebral disc degeneration, lumbar region: Secondary | ICD-10-CM | POA: Diagnosis not present

## 2022-09-12 MED ORDER — LIDOCAINE HCL 2 % IJ SOLN
INTRAMUSCULAR | Status: AC
  Filled 2022-09-12: qty 20

## 2022-09-12 MED ORDER — TRAMADOL HCL 50 MG PO TABS: 50.0000 mg | ORAL_TABLET | Freq: Three times a day (TID) | ORAL | 1 refills | Status: DC | PRN

## 2022-09-12 MED ORDER — DEXAMETHASONE SODIUM PHOSPHATE 10 MG/ML IJ SOLN
10.0000 mg | Freq: Once | INTRAMUSCULAR | Status: AC
  Administered 2022-09-12: 10 mg

## 2022-09-12 MED ORDER — DIAZEPAM 5 MG PO TABS
ORAL_TABLET | ORAL | Status: AC
  Filled 2022-09-12: qty 1

## 2022-09-12 MED ORDER — LIDOCAINE HCL 2 % IJ SOLN
20.0000 mL | Freq: Once | INTRAMUSCULAR | Status: AC
  Administered 2022-09-12: 100 mg

## 2022-09-12 MED ORDER — DIAZEPAM 5 MG PO TABS
5.0000 mg | ORAL_TABLET | ORAL | Status: AC
  Administered 2022-09-12: 5 mg via ORAL

## 2022-09-12 MED ORDER — DEXAMETHASONE SODIUM PHOSPHATE 10 MG/ML IJ SOLN
INTRAMUSCULAR | Status: AC
  Filled 2022-09-12: qty 1

## 2022-09-12 MED ORDER — ROPIVACAINE HCL 2 MG/ML IJ SOLN
INTRAMUSCULAR | Status: AC
  Filled 2022-09-12: qty 20

## 2022-09-12 MED ORDER — ROPIVACAINE HCL 2 MG/ML IJ SOLN
9.0000 mL | Freq: Once | INTRAMUSCULAR | Status: AC
  Administered 2022-09-12: 9 mL via PERINEURAL

## 2022-09-12 NOTE — Progress Notes (Signed)
Safety precautions to be maintained throughout the outpatient stay will include: orient to surroundings, keep bed in low position, maintain call bell within reach at all times, provide assistance with transfer out of bed and ambulation.  

## 2022-09-12 NOTE — Patient Instructions (Signed)
___________________________________________________________________________________________  Post-Radiofrequency (RF) Discharge Instructions  You have just completed a Radiofrequency Neurotomy.  The following instructions will provide you with information and guidelines for self-care upon discharge.  If at any time you have questions or concerns please call your physician. DO NOT DRIVE YOURSELF!!  Instructions: Apply ice: Fill a plastic sandwich bag with crushed ice. Cover it with a small towel and apply to injection site. Apply for 15 minutes then remove x 15 minutes. Repeat sequence on day of procedure, until you go to bed. The purpose is to minimize swelling and discomfort after procedure. Apply heat: Apply heat to procedure site starting the day following the procedure. The purpose is to treat any soreness and discomfort from the procedure. Food intake: No eating limitations, unless stipulated above.  Nevertheless, if you have had sedation, you may experience some nausea.  In this case, it may be wise to wait at least two hours prior to resuming regular diet. Physical activities: Keep activities to a minimum for the first 8 hours after the procedure. For the first 24 hours after the procedure, do not drive a motor vehicle,  Operate heavy machinery, power tools, or handle any weapons.  Consider walking with the use of an assistive device or accompanied by an adult for the first 24 hours.  Do not drink alcoholic beverages including beer.  Do not make any important decisions or sign any legal documents. Go home and rest today.  Resume activities tomorrow, as tolerated.  Use caution in moving about as you may experience mild leg weakness.  Use caution in cooking, use of household electrical appliances and climbing steps. Driving: If you have received any sedation, you are not allowed to drive for 24 hours after your procedure. Blood thinner: Restart your blood thinner 6 hours after your procedure. (Only  for those taking blood thinners) Insulin: As soon as you can eat, you may resume your normal dosing schedule. (Only for those taking insulin) Medications: May resume pre-procedure medications.  Do not take any drugs, other than what has been prescribed to you. Infection prevention: Keep procedure site clean and dry. Post-procedure Pain Diary: Extremely important that this be done correctly and accurately. Recorded information will be used to determine the next step in treatment. Pain evaluated is that of treated area only. Do not include pain from an untreated area. Complete every hour, on the hour, for the initial 8 hours. Set an alarm to help you do this part accurately. Do not go to sleep and have it completed later. It will not be accurate. Follow-up appointment: Keep your follow-up appointment after the procedure. Usually 2-6 weeks after radiofrequency. Bring you pain diary. The information collected will be essential for your long-term care.   Expect: From numbing medicine (AKA: Local Anesthetics): Numbness or decrease in pain. Onset: Full effect within 15 minutes of injected. Duration: It will depend on the type of local anesthetic used. On the average, 1 to 8 hours.  From steroids (when added): Decrease in swelling or inflammation. Once inflammation is improved, relief of the pain will follow. Onset of benefits: Depends on the amount of swelling present. The more swelling, the longer it will take for the benefits to be seen. In some cases, up to 10 days. Duration: Steroids will stay in the system x 2 weeks. Duration of benefits will depend on multiple posibilities including persistent irritating factors. From procedure: Some discomfort is to be expected once the numbing medicine wears off. In the case of radiofrequency procedures,   this may last as long as 6 weeks. Additional post-procedure pain medication is provided for this. Discomfort is minimized if ice and heat are applied as  instructed.  Call if: You experience numbness and weakness that gets worse with time, as opposed to wearing off. He experience any unusual bleeding, difficulty breathing, or loss of the ability to control your bowel and bladder. (This applies to Spinal procedures only) You experience any redness, swelling, heat, red streaks, elevated temperature, fever, or any other signs of a possible infection.  Emergency Numbers: Durning business hours (Monday - Thursday, 8:00 AM - 4:00 PM) (Friday, 9:00 AM - 12:00 Noon): [phone] After hours: [phone] ____________________________________________________________________________________________  Radiofrequency Ablation Radiofrequency ablation is a procedure that is performed to relieve pain. The procedure is often used for back, neck, or arm pain. Radiofrequency ablation involves the use of a machine that creates radio waves to make heat. During the procedure, the heat is applied to the nerve that carries the pain signal. The heat damages the nerve and interferes with the pain signal. Pain relief usually starts about 2 weeks after the procedure and lasts for 6 months to 1 year. Tell a health care provider about: Any allergies you have. All medicines you are taking, including vitamins, herbs, eye drops, creams, and over-the-counter medicines. Any problems you or family members have had with anesthetic medicines. Any bleeding problems you have. Any surgeries you have had. Any medical conditions you have. Whether you are pregnant or may be pregnant. What are the risks? Generally, this is a safe procedure. However, problems may occur, including: Pain or soreness at the injection site. Allergic reaction to medicines given during the procedure. Bleeding. Infection at the injection site. Damage to nerves or blood vessels. What happens before the procedure? When to stop eating and drinking Follow instructions from your health care provider about  what you may eat and drink before your procedure. These may include: 8 hours before the procedure Stop eating most foods. Do not eat meat, fried foods, or fatty foods. Eat only light foods, such as toast or crackers. All liquids are okay except energy drinks and alcohol. 6 hours before the procedure Stop eating. Drink only clear liquids, such as water, clear fruit juice, black coffee, plain tea, and sports drinks. Do not drink energy drinks or alcohol. 2 hours before the procedure Stop drinking all liquids. You may be allowed to take medicine with small sips of water. If you do not follow your health care provider's instructions, your procedure may be delayed or canceled. Medicines Ask your health care provider about: Changing or stopping your regular medicines. This is especially important if you are taking diabetes medicines or blood thinners. Taking medicines such as aspirin and ibuprofen. These medicines can thin your blood. Do not take these medicines unless your health care provider tells you to take them. Taking over-the-counter medicines, vitamins, herbs, and supplements. General instructions Ask your health care provider what steps will be taken to help prevent infection. These steps may include: Removing hair at the procedure site. Washing skin with a germ-killing soap. Taking antibiotic medicine. If you will be going home right after the procedure, plan to have a responsible adult: Take you home from the hospital or clinic. You will not be allowed to drive. Care for you for the time you are told. What happens during the procedure?  You will be awake during the procedure. You will need to be able to talk with the health care provider during   the procedure. An IV will be inserted into one of your veins. You will be given one or more of the following: A medicine to help you relax (sedative). A medicine to numb the area (local anesthetic). Your health care provider will insert  a radiofrequency needle into the area to be treated. This is done with the help of fluoroscopy. A wire that carries the radio waves (electrode) will be put through the radiofrequency needle. An electrical pulse will be sent through the electrode to verify the correct nerve that is causing your pain. You will feel a tingling sensation, and you may have muscle twitching. The tissue around the needle tip will be heated by an electric current that comes from the radiofrequency machine. This will numb the nerves. The needle will be removed. A bandage (dressing) will be put on the insertion area. The procedure may vary among health care providers and hospitals. What happens after the procedure? Your blood pressure, heart rate, breathing rate, and blood oxygen level will be monitored until you leave the hospital or clinic. Return to your normal activities as told by your health care provider. Ask your health care provider what activities are safe for you. If you were given a sedative during the procedure, it can affect you for several hours. Do not drive or operate machinery until your health care provider says that it is safe. Summary Radiofrequency ablation is a procedure that is performed to relieve pain. The procedure is often used for back, neck, or arm pain. Radiofrequency ablation involves the use of a machine that creates radio waves to make heat. Plan to have a responsible adult take you home from the hospital or clinic. Do not drive or operate machinery until your health care provider says that it is safe. Return to your normal activities as told by your health care provider. Ask your health care provider what activities are safe for you. This information is not intended to replace advice given to you by your health care provider. Make sure you discuss any questions you have with your health care provider. Document Revised: 11/29/2020 Document Reviewed: 11/29/2020 [employer] Patient Education   2023 [employer]  

## 2022-09-12 NOTE — Progress Notes (Signed)
PROVIDER NOTE: Interpretation of information contained herein should be left to medically-trained personnel. Specific patient instructions are provided elsewhere under "Patient Instructions" section of medical record. This document was created in part using STT-dictation technology, any transcriptional errors that may result from this process are unintentional.  Patient: Erica Walters Type: Established DOB: 1965-09-07 MRN: [id number] PCP: Susy Frizzle, MD  Service: Procedure DOS: 09/12/2022 Setting: Ambulatory Location: Ambulatory outpatient facility Delivery: Face-to-face Provider: Gillis Santa, MD Specialty: Interventional Pain Management Specialty designation: 09 Location: Outpatient facility Ref. Prov.: Gillis Santa, MD       Interventional Therapy   Procedure: Lumbar Facet, Medial Branch Radiofrequency Ablation (RFA) #1  Laterality: Left (-LT)  Level: L3, L4, and L5 Medial Branch Level(s). These levels will denervate the L3-4 and L4-5 lumbar facet joints.  Imaging: Fluoroscopy-guided         Anesthesia: Local anesthesia (1-2% Lidocaine) Anxiolysis:5 mg PO Valium DOS: 09/12/2022  Performed by: Gillis Santa, MD  Purpose: Therapeutic/Palliative Indications: Low back pain severe enough to impact quality of life or function. Indications: 1. Lumbar facet arthropathy   2. Lumbar degenerative disc disease   3. Chronic pain syndrome    Erica Walters has been dealing with the above chronic pain for longer than three months and has either failed to respond, was unable to tolerate, or simply did not get enough benefit from other more conservative therapies including, but not limited to: 1. Over-the-counter medications 2. Anti-inflammatory medications 3. Muscle relaxants 4. Membrane stabilizers 5. Opioids 6. Physical therapy and/or chiropractic manipulation 7. Modalities (Heat, ice, etc.) 8. Invasive techniques such as nerve blocks. Erica Walters has attained more than 50% relief  of the pain from a series of diagnostic injections conducted in separate occasions.  Pain Score: Pre-procedure: 5 /10 Post-procedure: 2 /10     Position / Prep / Materials:  Position: Prone  Prep solution: DuraPrep (Iodine Povacrylex [0.7% available iodine] and Isopropyl Alcohol, 74% w/w) Prep Area: Entire Lumbosacral Region (Lower back from mid-thoracic region to end of tailbone and from flank to flank.) Materials:  Tray: RFA (Radiofrequency) tray Needle(s):  Type: RFA (Teflon-coated radiofrequency ablation needles) Gauge (G): 22  Length: Regular (10cm) Qty: 3      Pre-op H&P Assessment:  Erica Walters is a 57 y.o. (year old), female patient, seen today for interventional treatment. She  has a past surgical history that includes Total abdominal hysterectomy w/ bilateral salpingoophorectomy and Colonoscopy (12/04/2017). Erica Walters has a current medication list which includes the following prescription(s): cyclobenzaprine, ibuprofen, nortriptyline, pregabalin, and tramadol. Her primarily concern today is the Back Pain (lower)  Initial Vital Signs:  Pulse/HCG Rate: 93ECG Heart Rate: 98 Temp: 97.6 F (36.4 C) Resp: 16 BP: (!) 126/94 SpO2: 98 %  BMI: Estimated body mass index is 31.8 kg/m as calculated from the following:   Height as of this encounter: 5' 6.5" (1.689 m).   Weight as of this encounter: 200 lb (90.7 kg).  Risk Assessment: Allergies: Reviewed. She has No Known Allergies.  Allergy Precautions: None required Coagulopathies: Reviewed. None identified.  Blood-thinner therapy: None at this time Active Infection(s): Reviewed. None identified. Erica Walters is afebrile  Site Confirmation: Erica Walters was asked to confirm the procedure and laterality before marking the site Procedure checklist: Completed Consent: Before the procedure and under the influence of no sedative(s), amnesic(s), or anxiolytics, the patient was informed of the treatment options, risks and  possible complications. To fulfill our ethical and legal obligations, as recommended by the American Medical Association's Code  of Ethics, I have informed the patient of my clinical impression; the nature and purpose of the treatment or procedure; the risks, benefits, and possible complications of the intervention; the alternatives, including doing nothing; the risk(s) and benefit(s) of the alternative treatment(s) or procedure(s); and the risk(s) and benefit(s) of doing nothing. The patient was provided information about the general risks and possible complications associated with the procedure. These may include, but are not limited to: failure to achieve desired goals, infection, bleeding, organ or nerve damage, allergic reactions, paralysis, and death. In addition, the patient was informed of those risks and complications associated to Spine-related procedures, such as failure to decrease pain; infection (i.e.: Meningitis, epidural or intraspinal abscess); bleeding (i.e.: epidural hematoma, subarachnoid hemorrhage, or any other type of intraspinal or peri-dural bleeding); organ or nerve damage (i.e.: Any type of peripheral nerve, nerve root, or spinal cord injury) with subsequent damage to sensory, motor, and/or autonomic systems, resulting in permanent pain, numbness, and/or weakness of one or several areas of the body; allergic reactions; (i.e.: anaphylactic reaction); and/or death. Furthermore, the patient was informed of those risks and complications associated with the medications. These include, but are not limited to: allergic reactions (i.e.: anaphylactic or anaphylactoid reaction(s)); adrenal axis suppression; blood sugar elevation that in diabetics may result in ketoacidosis or comma; water retention that in patients with history of congestive heart failure may result in shortness of breath, pulmonary edema, and decompensation with resultant heart failure; weight gain; swelling or edema;  medication-induced neural toxicity; particulate matter embolism and blood vessel occlusion with resultant organ, and/or nervous system infarction; and/or aseptic necrosis of one or more joints. Finally, the patient was informed that Medicine is not an exact science; therefore, there is also the possibility of unforeseen or unpredictable risks and/or possible complications that may result in a catastrophic outcome. The patient indicated having understood very clearly. We have given the patient no guarantees and we have made no promises. Enough time was given to the patient to ask questions, all of which were answered to the patient's satisfaction. Ms. Juaire has indicated that she wanted to continue with the procedure. Attestation: I, the ordering provider, attest that I have discussed with the patient the benefits, risks, side-effects, alternatives, likelihood of achieving goals, and potential problems during recovery for the procedure that I have provided informed consent. Date  Time: 09/12/2022 10:31 AM   Pre-Procedure Preparation:  Monitoring: As per clinic protocol. Respiration, ETCO2, SpO2, BP, heart rate and rhythm monitor placed and checked for adequate function Safety Precautions: Patient was assessed for positional comfort and pressure points before starting the procedure. Time-out: I initiated and conducted the "Time-out" before starting the procedure, as per protocol. The patient was asked to participate by confirming the accuracy of the "Time Out" information. Verification of the correct person, site, and procedure were performed and confirmed by me, the nursing staff, and the patient. "Time-out" conducted as per Joint Commission's Universal Protocol (UP.01.01.01). Time: 1059  Description of Procedure:          Laterality: See above. Levels:  See above. Safety Precautions: Aspiration looking for blood return was conducted prior to all injections. At no point did we inject any substances,  as a needle was being advanced. Before injecting, the patient was told to immediately notify me if she was experiencing any new onset of "ringing in the ears, or metallic taste in the mouth". No attempts were made at seeking any paresthesias. Safe injection practices and needle disposal techniques used. Medications  properly checked for expiration dates. SDV (single dose vial) medications used. After the completion of the procedure, all disposable equipment used was discarded in the proper designated medical waste containers. Local Anesthesia: Protocol guidelines were followed. The patient was positioned over the fluoroscopy table. The area was prepped in the usual manner. The time-out was completed. The target area was identified using fluoroscopy. A 12-in long, straight, sterile hemostat was used with fluoroscopic guidance to locate the targets for each level blocked. Once located, the skin was marked with an approved surgical skin marker. Once all sites were marked, the skin (epidermis, dermis, and hypodermis), as well as deeper tissues (fat, connective tissue and muscle) were infiltrated with a small amount of a short-acting local anesthetic, loaded on a 10cc syringe with a 25G, 1.5-in  Needle. An appropriate amount of time was allowed for local anesthetics to take effect before proceeding to the next step. Technical description of process:  Radiofrequency Ablation (RFA) L3 Medial Branch Nerve RFA: The target area for the L3 medial branch is at the junction of the postero-lateral aspect of the superior articular process and the superior, posterior, and medial edge of the transverse process of L4. Under fluoroscopic guidance, a Radiofrequency needle was inserted until contact was made with os over the superior postero-lateral aspect of the pedicular shadow (target area). Sensory and motor testing was conducted to properly adjust the position of the needle. Once satisfactory placement of the needle was  achieved, the numbing solution was slowly injected after negative aspiration for blood. 2.0 mL of the nerve block solution was injected without difficulty or complication. After waiting for at least 3 minutes, the ablation was performed. Once completed, the needle was removed intact. L4 Medial Branch Nerve RFA: The target area for the L4 medial branch is at the junction of the postero-lateral aspect of the superior articular process and the superior, posterior, and medial edge of the transverse process of L5. Under fluoroscopic guidance, a Radiofrequency needle was inserted until contact was made with os over the superior postero-lateral aspect of the pedicular shadow (target area). Sensory and motor testing was conducted to properly adjust the position of the needle. Once satisfactory placement of the needle was achieved, the numbing solution was slowly injected after negative aspiration for blood. 2.0 mL of the nerve block solution was injected without difficulty or complication. After waiting for at least 3 minutes, the ablation was performed. Once completed, the needle was removed intact. L5 Medial Branch Nerve RFA: The target area for the L5 medial branch is at the junction of the postero-lateral aspect of the superior articular process of S1 and the superior, posterior, and medial edge of the sacral ala. Under fluoroscopic guidance, a Radiofrequency needle was inserted until contact was made with os over the superior postero-lateral aspect of the pedicular shadow (target area). Sensory and motor testing was conducted to properly adjust the position of the needle. Once satisfactory placement of the needle was achieved, the numbing solution was slowly injected after negative aspiration for blood. 2.0 mL of the nerve block solution was injected without difficulty or complication. After waiting for at least 3 minutes, the ablation was performed. Once completed, the needle was removed intact.  6 cc solution  made of 5 cc of 0.2% ropivacaine, 1 cc of Decadron 10 mg/cc.  2 cc injected at each level above on the left after sensorimotor testing, prior to lesioning.  Radiofrequency lesioning (ablation):  Radiofrequency Generator: Medtronic AccurianTM AG 1000 RF Generator Sensory Stimulation Parameters:  50 Hz was used to locate & identify the nerve, making sure that the needle was positioned such that there was no sensory stimulation below 0.3 V or above 0.7 V. Motor Stimulation Parameters: 2 Hz was used to evaluate the motor component. Care was taken not to lesion any nerves that demonstrated motor stimulation of the lower extremities at an output of less than 2.5 times that of the sensory threshold, or a maximum of 2.0 V. Lesioning Technique Parameters: Standard Radiofrequency settings. (Not bipolar or pulsed.) Temperature Settings: 80 degrees C Lesioning time: 60 seconds Intra-operative Compliance: Compliant  Once the entire procedure was completed, the treated area was cleaned, making sure to leave some of the prepping solution back to take advantage of its long term bactericidal properties.    Illustration of the posterior view of the lumbar spine and the posterior neural structures. Laminae of L2 through S1 are labeled. DPRL5, dorsal primary ramus of L5; DPRS1, dorsal primary ramus of S1; DPR3, dorsal primary ramus of L3; FJ, facet (zygapophyseal) joint L3-L4; I, inferior articular process of L4; LB1, lateral branch of dorsal primary ramus of L1; IAB, inferior articular branches from L3 medial branch (supplies L4-L5 facet joint); IBP, intermediate branch plexus; MB3, medial branch of dorsal primary ramus of L3; NR3, third lumbar nerve root; S, superior articular process of L5; SAB, superior articular branches from L4 (supplies L4-5 facet joint also); TP3, transverse process of L3.  Vitals:   09/12/22 1058 09/12/22 1103 09/12/22 1108 09/12/22 1113  BP: (!) 139/110 (!) 137/104 (!) 142/109 (!) 123/95   Pulse:      Resp: 18 16 18 15   Temp:      SpO2: 95% 95% 95% 99%  Weight:      Height:        Start Time: 1100 hrs. End Time: 1113 hrs.  Imaging Guidance (Spinal):          Type of Imaging Technique: Fluoroscopy Guidance (Spinal) Indication(s): Assistance in needle guidance and placement for procedures requiring needle placement in or near specific anatomical locations not easily accessible without such assistance. Exposure Time: Please see nurses notes. Contrast: None used. Fluoroscopic Guidance: I was personally present during the use of fluoroscopy. "Tunnel Vision Technique" used to obtain the best possible view of the target area. Parallax error corrected before commencing the procedure. "Direction-depth-direction" technique used to introduce the needle under continuous pulsed fluoroscopy. Once target was reached, antero-posterior, oblique, and lateral fluoroscopic projection used confirm needle placement in all planes. Images permanently stored in EMR. Interpretation: No contrast injected. I personally interpreted the imaging intraoperatively. Adequate needle placement confirmed in multiple planes. Permanent images saved into the patient's record.  Antibiotic Prophylaxis:   Anti-infectives (From admission, onward)    None      Indication(s): None identified  Post-operative Assessment:  Post-procedure Vital Signs:  Pulse/HCG Rate: 9393 Temp: 97.6 F (36.4 C) Resp: 15 BP: (!) 123/95 SpO2: 99 %  EBL: None  Complications: No immediate post-treatment complications observed by team, or reported by patient.  Note: The patient tolerated the entire procedure well. A repeat set of vitals were taken after the procedure and the patient was kept under observation following institutional policy, for this type of procedure. Post-procedural neurological assessment was performed, showing return to baseline, prior to discharge. The patient was provided with post-procedure discharge  instructions, including a section on how to identify potential problems. Should any problems arise concerning this procedure, the patient was given instructions to immediately contact us, at any  time, without hesitation. In any case, we plan to contact the patient by telephone for a follow-up status report regarding this interventional procedure.  Comments:  No additional relevant information.  Plan of Care (POC)  Orders:  Orders Placed This Encounter  Procedures   DG PA[facility] C-ARM 1-60 MIN NO REPORT    Intraoperative interpretation by procedural physician at [facility].    Standing Status:   Standing    Number of Occurrences:   1    Order Specific Question:   Reason for exam:    Answer:   Assistance in needle guidance and placement for procedures requiring needle placement in or near specific anatomical locations not easily accessible without such assistance.     Medications ordered for procedure: Meds ordered this encounter  Medications   lidocaine (XYLOCAINE) 2 % (with pres) injection 400 mg   diazepam (VALIUM) tablet 5 mg    Make sure Flumazenil is available in the pyxis when using this medication. If oversedation occurs, administer 0.2 mg IV over 15 sec. If after 45 sec no response, administer 0.2 mg again over 1 min; may repeat at 1 min intervals; not to exceed 4 doses (1 mg)   dexamethasone (DECADRON) injection 10 mg   ropivacaine (PF) 2 mg/mL (0.2%) (NAROPIN) injection 9 mL   traMADol (ULTRAM) 50 MG tablet    Sig: Take 1 tablet (50 mg total) by mouth every 8 (eight) hours as needed.    Dispense:  90 tablet    Refill:  1   Medications administered: We administered lidocaine, diazepam, dexamethasone, and ropivacaine (PF) 2 mg/mL (0.2%).  See the medical record for exact dosing, route, and time of administration.  Follow-up plan:   Return for Keep sch. appt, Contra-lateral RFA.      Recent Visits Date Type Provider Dept  08/13/22 Office Visit Gillis Santa, MD [facility]  07/18/22 Procedure visit Gillis Santa, MD [facility]  Showing recent visits within past 90 days and meeting all other requirements Today's Visits Date Type Provider Dept  09/12/22 Procedure visit Gillis Santa, MD [facility]  Showing today's visits and meeting all other requirements Future Appointments Date Type Provider Dept  09/26/22 Appointment Gillis Santa, MD [facility]  Showing future appointments within next 90 days and meeting all other requirements  Disposition: Discharge home  Discharge (Date  Time): 09/12/2022; 1125 hrs.   Primary Care Physician: Susy Frizzle, MD Location: [facility] Note by: Gillis Santa, MD (TTS technology used. I apologize for any typographical errors that were not detected and corrected.) Date: 09/12/2022; Time: 1:00 PM  Disclaimer:  Medicine is not an Chief Strategy Officer. The only guarantee in medicine is that nothing is guaranteed. It is important to note that the decision to proceed with this intervention was based on the information collected from the patient. The Data and conclusions were drawn from the patient's questionnaire, the interview, and the physical examination. Because the information was provided in large part by the patient, it cannot be guaranteed that it has not been purposely or unconsciously manipulated. Every effort has been made to obtain as much relevant data as possible for this evaluation. It is important to note that the conclusions that lead to this procedure are derived in large part from the available data. Always take into account that the treatment will also be dependent on availability of resources and existing treatment guidelines, considered by other Pain Management Practitioners as being common knowledge  and practice, at the time of the intervention. For Medico-Legal purposes, it is also important to point out that variation in  procedural techniques and pharmacological choices are the acceptable norm. The indications, contraindications, technique, and results of the above procedure should only be interpreted and judged by a Board-Certified Interventional Pain Specialist with extensive familiarity and expertise in the same exact procedure and technique.

## 2022-09-13 ENCOUNTER — Telehealth: Payer: Self-pay | Admitting: *Deleted

## 2022-09-13 NOTE — Telephone Encounter (Signed)
No problems post procedure. 

## 2022-09-19 ENCOUNTER — Encounter: Payer: Self-pay | Admitting: Student in an Organized Health Care Education/Training Program

## 2022-09-19 DIAGNOSIS — M5136 Other intervertebral disc degeneration, lumbar region: Secondary | ICD-10-CM

## 2022-09-19 DIAGNOSIS — M47816 Spondylosis without myelopathy or radiculopathy, lumbar region: Secondary | ICD-10-CM

## 2022-09-20 MED ORDER — CELECOXIB 100 MG PO CAPS: 100.0000 mg | ORAL_CAPSULE | Freq: Two times a day (BID) | ORAL | 0 refills | Status: DC

## 2022-09-20 NOTE — Telephone Encounter (Signed)
Attempted to call patient to discuss these options. Message left.

## 2022-09-26 ENCOUNTER — Encounter: Payer: Self-pay | Admitting: Student in an Organized Health Care Education/Training Program

## 2022-09-26 ENCOUNTER — Ambulatory Visit
Admission: RE | Admit: 2022-09-26 | Discharge: 2022-09-26 | Disposition: A | Discharge status: Home / Self Care | Payer: 59 | Source: Ambulatory Visit | Attending: Student in an Organized Health Care Education/Training Program | Admitting: Student in an Organized Health Care Education/Training Program

## 2022-09-26 ENCOUNTER — Ambulatory Visit
Payer: 59 | Attending: Student in an Organized Health Care Education/Training Program | Admitting: Student in an Organized Health Care Education/Training Program

## 2022-09-26 VITALS — BP 121/94 | HR 96 | Temp 97.3°F | Resp 16 | Ht 66.0 in | Wt 200.0 lb

## 2022-09-26 DIAGNOSIS — M47816 Spondylosis without myelopathy or radiculopathy, lumbar region: Secondary | ICD-10-CM | POA: Insufficient documentation

## 2022-09-26 DIAGNOSIS — M5136 Other intervertebral disc degeneration, lumbar region: Secondary | ICD-10-CM | POA: Diagnosis not present

## 2022-09-26 DIAGNOSIS — G894 Chronic pain syndrome: Secondary | ICD-10-CM | POA: Diagnosis not present

## 2022-09-26 DIAGNOSIS — M545 Low back pain, unspecified: Secondary | ICD-10-CM | POA: Insufficient documentation

## 2022-09-26 MED ORDER — ROPIVACAINE HCL 2 MG/ML IJ SOLN
9.0000 mL | Freq: Once | INTRAMUSCULAR | Status: AC
  Administered 2022-09-26: 9 mL via PERINEURAL

## 2022-09-26 MED ORDER — DEXAMETHASONE SODIUM PHOSPHATE 10 MG/ML IJ SOLN
10.0000 mg | Freq: Once | INTRAMUSCULAR | Status: AC
  Administered 2022-09-26: 10 mg
  Filled 2022-09-26: qty 1

## 2022-09-26 MED ORDER — TRIAMCINOLONE ACETONIDE 40 MG/ML IJ SUSP
INTRAMUSCULAR | Status: AC
  Filled 2022-09-26: qty 1

## 2022-09-26 MED ORDER — MIDAZOLAM HCL 2 MG/2ML IJ SOLN
INTRAMUSCULAR | Status: AC
  Filled 2022-09-26: qty 2

## 2022-09-26 MED ORDER — MIDAZOLAM HCL 2 MG/2ML IJ SOLN
0.5000 mg | Freq: Once | INTRAMUSCULAR | Status: AC
  Administered 2022-09-26: 2 mg via INTRAVENOUS

## 2022-09-26 MED ORDER — LACTATED RINGERS IV SOLN: Freq: Once | INTRAVENOUS | Status: AC

## 2022-09-26 MED ORDER — LIDOCAINE HCL 2 % IJ SOLN
INTRAMUSCULAR | Status: AC
  Filled 2022-09-26: qty 20

## 2022-09-26 MED ORDER — LIDOCAINE HCL 2 % IJ SOLN
20.0000 mL | Freq: Once | INTRAMUSCULAR | Status: AC
  Administered 2022-09-26: 400 mg

## 2022-09-26 MED ORDER — ROPIVACAINE HCL 2 MG/ML IJ SOLN
INTRAMUSCULAR | Status: AC
  Filled 2022-09-26: qty 20

## 2022-09-26 NOTE — Progress Notes (Signed)
PROVIDER NOTE: Interpretation of information contained herein should be left to medically-trained personnel. Specific patient instructions are provided elsewhere under "Patient Instructions" section of medical record. This document was created in part using STT-dictation technology, any transcriptional errors that may result from this process are unintentional.  Patient: Erica Walters Type: Established DOB: 1965-07-21 MRN: [id number] PCP: Susy Frizzle, MD  Service: Procedure DOS: 09/26/2022 Setting: Ambulatory Location: Ambulatory outpatient facility Delivery: Face-to-face Provider: Gillis Santa, MD Specialty: Interventional Pain Management Specialty designation: 09 Location: Outpatient facility Ref. Prov.: Susy Frizzle, MD       Interventional Therapy   Procedure: Lumbar Facet, Medial Branch Radiofrequency Ablation (RFA) #1  Laterality: Right (-RT)  Level: L3, L4, and L5 Medial Branch Level(s). These levels will denervate the L3-4 and L4-5 lumbar facet joints.  Imaging: Fluoroscopy-guided         Anesthesia: Local anesthesia (1-2% Lidocaine) Anxiolysis:5 mg PO Valium DOS: 09/26/2022  Performed by: Gillis Santa, MD  Purpose: Therapeutic/Palliative Indications: Low back pain severe enough to impact quality of life or function. Indications: 1. Lumbar facet arthropathy   2. Lumbar degenerative disc disease   3. Chronic pain syndrome    Erica Walters has been dealing with the above chronic pain for longer than three months and has either failed to respond, was unable to tolerate, or simply did not get enough benefit from other more conservative therapies including, but not limited to: 1. Over-the-counter medications 2. Anti-inflammatory medications 3. Muscle relaxants 4. Membrane stabilizers 5. Opioids 6. Physical therapy and/or chiropractic manipulation 7. Modalities (Heat, ice, etc.) 8. Invasive techniques such as nerve blocks. Erica Walters has attained more than 50%  relief of the pain from a series of diagnostic injections conducted in separate occasions.  Pain Score: Pre-procedure: 2 /10 Post-procedure: 0-No pain/10     Position / Prep / Materials:  Position: Prone  Prep solution: DuraPrep (Iodine Povacrylex [0.7% available iodine] and Isopropyl Alcohol, 74% w/w) Prep Area: Entire Lumbosacral Region (Lower back from mid-thoracic region to end of tailbone and from flank to flank.) Materials:  Tray: RFA (Radiofrequency) tray Needle(s):  Type: RFA (Teflon-coated radiofrequency ablation needles) Gauge (G): 22  Length: Regular (10cm) Qty: 3      Pre-op H&P Assessment:  Erica Walters is a 57 y.o. (year old), female patient, seen today for interventional treatment. She  has a past surgical history that includes Total abdominal hysterectomy w/ bilateral salpingoophorectomy and Colonoscopy (12/04/2017). [name] has a current medication list which includes the following prescription(s): celecoxib, cyclobenzaprine, ibuprofen, nortriptyline, pregabalin, and tramadol, and the following Facility-Administered Medications: lactated ringers. Her primarily concern today is the Back Pain (Lower left back)  Initial Vital Signs:  Pulse/HCG Rate: 100  Temp: (!) 97.3 F (36.3 C) Resp: 17 BP: (!) 128/94 SpO2: 95 %  BMI: Estimated body mass index is 32.28 kg/m as calculated from the following:   Height as of this encounter: 5\' 6"  (1.676 m).   Weight as of this encounter: 200 lb (90.7 kg).  Risk Assessment: Allergies: Reviewed. She has No Known Allergies.  Allergy Precautions: None required Coagulopathies: Reviewed. None identified.  Blood-thinner therapy: None at this time Active Infection(s): Reviewed. None identified. Erica Walters is afebrile  Site Confirmation: Erica Walters was asked to confirm the procedure and laterality before marking the site Procedure checklist: Completed Consent: Before the procedure and under the influence of no sedative(s),  amnesic(s), or anxiolytics, the patient was informed of the treatment options, risks and possible complications. To fulfill our ethical and  legal obligations, as recommended by the American Medical Association's Code of Ethics, I have informed the patient of my clinical impression; the nature and purpose of the treatment or procedure; the risks, benefits, and possible complications of the intervention; the alternatives, including doing nothing; the risk(s) and benefit(s) of the alternative treatment(s) or procedure(s); and the risk(s) and benefit(s) of doing nothing. The patient was provided information about the general risks and possible complications associated with the procedure. These may include, but are not limited to: failure to achieve desired goals, infection, bleeding, organ or nerve damage, allergic reactions, paralysis, and death. In addition, the patient was informed of those risks and complications associated to Spine-related procedures, such as failure to decrease pain; infection (i.e.: Meningitis, epidural or intraspinal abscess); bleeding (i.e.: epidural hematoma, subarachnoid hemorrhage, or any other type of intraspinal or peri-dural bleeding); organ or nerve damage (i.e.: Any type of peripheral nerve, nerve root, or spinal cord injury) with subsequent damage to sensory, motor, and/or autonomic systems, resulting in permanent pain, numbness, and/or weakness of one or several areas of the body; allergic reactions; (i.e.: anaphylactic reaction); and/or death. Furthermore, the patient was informed of those risks and complications associated with the medications. These include, but are not limited to: allergic reactions (i.e.: anaphylactic or anaphylactoid reaction(s)); adrenal axis suppression; blood sugar elevation that in diabetics may result in ketoacidosis or comma; water retention that in patients with history of congestive heart failure may result in shortness of breath, pulmonary edema, and  decompensation with resultant heart failure; weight gain; swelling or edema; medication-induced neural toxicity; particulate matter embolism and blood vessel occlusion with resultant organ, and/or nervous system infarction; and/or aseptic necrosis of one or more joints. Finally, the patient was informed that Medicine is not an exact science; therefore, there is also the possibility of unforeseen or unpredictable risks and/or possible complications that may result in a catastrophic outcome. The patient indicated having understood very clearly. We have given the patient no guarantees and we have made no promises. Enough time was given to the patient to ask questions, all of which were answered to the patient's satisfaction. Ms. Cromley has indicated that she wanted to continue with the procedure. Attestation: I, the ordering provider, attest that I have discussed with the patient the benefits, risks, side-effects, alternatives, likelihood of achieving goals, and potential problems during recovery for the procedure that I have provided informed consent. Date  Time: 09/26/2022  9:21 AM   Pre-Procedure Preparation:  Monitoring: As per clinic protocol. Respiration, ETCO2, SpO2, BP, heart rate and rhythm monitor placed and checked for adequate function Safety Precautions: Patient was assessed for positional comfort and pressure points before starting the procedure. Time-out: I initiated and conducted the "Time-out" before starting the procedure, as per protocol. The patient was asked to participate by confirming the accuracy of the "Time Out" information. Verification of the correct person, site, and procedure were performed and confirmed by me, the nursing staff, and the patient. "Time-out" conducted as per Joint Commission's Universal Protocol (UP.01.01.01). Time: 1001  Description of Procedure:          Laterality: See above. Levels:  See above. Safety Precautions: Aspiration looking for blood return was  conducted prior to all injections. At no point did we inject any substances, as a needle was being advanced. Before injecting, the patient was told to immediately notify me if she was experiencing any new onset of "ringing in the ears, or metallic taste in the mouth". No attempts were made at seeking  any paresthesias. Safe injection practices and needle disposal techniques used. Medications properly checked for expiration dates. SDV (single dose vial) medications used. After the completion of the procedure, all disposable equipment used was discarded in the proper designated medical waste containers. Local Anesthesia: Protocol guidelines were followed. The patient was positioned over the fluoroscopy table. The area was prepped in the usual manner. The time-out was completed. The target area was identified using fluoroscopy. A 12-in long, straight, sterile hemostat was used with fluoroscopic guidance to locate the targets for each level blocked. Once located, the skin was marked with an approved surgical skin marker. Once all sites were marked, the skin (epidermis, dermis, and hypodermis), as well as deeper tissues (fat, connective tissue and muscle) were infiltrated with a small amount of a short-acting local anesthetic, loaded on a 10cc syringe with a 25G, 1.5-in  Needle. An appropriate amount of time was allowed for local anesthetics to take effect before proceeding to the next step. Technical description of process:  Radiofrequency Ablation (RFA) L3 Medial Branch Nerve RFA: The target area for the L3 medial branch is at the junction of the postero-lateral aspect of the superior articular process and the superior, posterior, and medial edge of the transverse process of L4. Under fluoroscopic guidance, a Radiofrequency needle was inserted until contact was made with os over the superior postero-lateral aspect of the pedicular shadow (target area). Sensory and motor testing was conducted to properly adjust the  position of the needle. Once satisfactory placement of the needle was achieved, the numbing solution was slowly injected after negative aspiration for blood. 2.0 mL of the nerve block solution was injected without difficulty or complication. After waiting for at least 3 minutes, the ablation was performed. Once completed, the needle was removed intact. L4 Medial Branch Nerve RFA: The target area for the L4 medial branch is at the junction of the postero-lateral aspect of the superior articular process and the superior, posterior, and medial edge of the transverse process of L5. Under fluoroscopic guidance, a Radiofrequency needle was inserted until contact was made with os over the superior postero-lateral aspect of the pedicular shadow (target area). Sensory and motor testing was conducted to properly adjust the position of the needle. Once satisfactory placement of the needle was achieved, the numbing solution was slowly injected after negative aspiration for blood. 2.0 mL of the nerve block solution was injected without difficulty or complication. After waiting for at least 3 minutes, the ablation was performed. Once completed, the needle was removed intact. L5 Medial Branch Nerve RFA: The target area for the L5 medial branch is at the junction of the postero-lateral aspect of the superior articular process of S1 and the superior, posterior, and medial edge of the sacral ala. Under fluoroscopic guidance, a Radiofrequency needle was inserted until contact was made with os over the superior postero-lateral aspect of the pedicular shadow (target area). Sensory and motor testing was conducted to properly adjust the position of the needle. Once satisfactory placement of the needle was achieved, the numbing solution was slowly injected after negative aspiration for blood. 2.0 mL of the nerve block solution was injected without difficulty or complication. After waiting for at least 3 minutes, the ablation was  performed. Once completed, the needle was removed intact.  6 cc solution made of 5 cc of 0.2% ropivacaine, 1 cc of Decadron 10 mg/cc.  2 cc injected at each level above on the right after sensorimotor testing, prior to lesioning.  Radiofrequency lesioning (ablation):  Radiofrequency Generator: Medtronic AccurianTM AG 1000 RF Generator Sensory Stimulation Parameters: 50 Hz was used to locate & identify the nerve, making sure that the needle was positioned such that there was no sensory stimulation below 0.3 V or above 0.7 V. Motor Stimulation Parameters: 2 Hz was used to evaluate the motor component. Care was taken not to lesion any nerves that demonstrated motor stimulation of the lower extremities at an output of less than 2.5 times that of the sensory threshold, or a maximum of 2.0 V. Lesioning Technique Parameters: Standard Radiofrequency settings. (Not bipolar or pulsed.) Temperature Settings: 80 degrees C Lesioning time: 60 seconds Intra-operative Compliance: Compliant  Once the entire procedure was completed, the treated area was cleaned, making sure to leave some of the prepping solution back to take advantage of its long term bactericidal properties.    Illustration of the posterior view of the lumbar spine and the posterior neural structures. Laminae of L2 through S1 are labeled. DPRL5, dorsal primary ramus of L5; DPRS1, dorsal primary ramus of S1; DPR3, dorsal primary ramus of L3; FJ, facet (zygapophyseal) joint L3-L4; I, inferior articular process of L4; LB1, lateral branch of dorsal primary ramus of L1; IAB, inferior articular branches from L3 medial branch (supplies L4-L5 facet joint); IBP, intermediate branch plexus; MB3, medial branch of dorsal primary ramus of L3; NR3, third lumbar nerve root; S, superior articular process of L5; SAB, superior articular branches from L4 (supplies L4-5 facet joint also); TP3, transverse process of L3.  Vitals:   09/26/22 1005 09/26/22 1010 09/26/22  1015 09/26/22 1021  BP: (!) 118/95 130/89 (!) 120/96 (!) 121/94  Pulse: 92 94 95 96  Resp: 20 19 18 16   Temp:      SpO2: 98% 96% 95% 96%  Weight:      Height:        Start Time: 1001 hrs. End Time: 1018 hrs.  Imaging Guidance (Spinal):          Type of Imaging Technique: Fluoroscopy Guidance (Spinal) Indication(s): Assistance in needle guidance and placement for procedures requiring needle placement in or near specific anatomical locations not easily accessible without such assistance. Exposure Time: Please see nurses notes. Contrast: None used. Fluoroscopic Guidance: I was personally present during the use of fluoroscopy. "Tunnel Vision Technique" used to obtain the best possible view of the target area. Parallax error corrected before commencing the procedure. "Direction-depth-direction" technique used to introduce the needle under continuous pulsed fluoroscopy. Once target was reached, antero-posterior, oblique, and lateral fluoroscopic projection used confirm needle placement in all planes. Images permanently stored in EMR. Interpretation: No contrast injected. I personally interpreted the imaging intraoperatively. Adequate needle placement confirmed in multiple planes. Permanent images saved into the patient's record.  Antibiotic Prophylaxis:   Anti-infectives (From admission, onward)    None      Indication(s): None identified  Post-operative Assessment:  Post-procedure Vital Signs:  Pulse/HCG Rate: 96  Temp: (!) 97.3 F (36.3 C) Resp: 16 BP: (!) 121/94 SpO2: 96 %  EBL: None  Complications: No immediate post-treatment complications observed by team, or reported by patient.  Note: The patient tolerated the entire procedure well. A repeat set of vitals were taken after the procedure and the patient was kept under observation following institutional policy, for this type of procedure. Post-procedural neurological assessment was performed, showing return to baseline,  prior to discharge. The patient was provided with post-procedure discharge instructions, including a section on how to identify potential problems. Should any problems arise concerning this  procedure, the patient was given instructions to immediately contact us, at any time, without hesitation. In any case, we plan to contact the patient by telephone for a follow-up status report regarding this interventional procedure.  Comments:  No additional relevant information.  Plan of Care (POC)  Orders:  Orders Placed This Encounter  Procedures   DG PAIN [facility] C-ARM 1-60 MIN NO REPORT    Intraoperative interpretation by procedural physician at [place].    Standing Status:   Standing    Number of Occurrences:   1    Order Specific Question:   Reason for exam:    Answer:   Assistance in needle guidance and placement for procedures requiring needle placement in or near specific anatomical locations not easily accessible without such assistance.     Medications ordered for procedure: Meds ordered this encounter  Medications   lidocaine (XYLOCAINE) 2 % (with pres) injection 400 mg   lactated ringers infusion   midazolam (VERSED) injection 0.5-2 mg    Make sure Flumazenil is available in the pyxis when using this medication. If oversedation occurs, administer 0.2 mg IV over 15 sec. If after 45 sec no response, administer 0.2 mg again over 1 min; may repeat at 1 min intervals; not to exceed 4 doses (1 mg)   dexamethasone (DECADRON) injection 10 mg   ropivacaine (PF) 2 mg/mL (0.2%) (NAROPIN) injection 9 mL   Medications administered: We administered lidocaine, lactated ringers, midazolam, dexamethasone, and ropivacaine (PF) 2 mg/mL (0.2%).  See the medical record for exact dosing, route, and time of administration.  Follow-up plan:   Return in about 8 weeks (around 11/21/2022) for Post Procedure Evaluation, virtual.      Recent Visits Date Type Provider Dept  09/12/22 Procedure  visit Gillis Santa, [facility]  08/13/22 Office Visit Gillis Santa, MD [facility]  07/18/22 Procedure visit Gillis Santa, MD [facility]  Showing recent visits within past 90 days and meeting all other requirements Today's Visits Date Type Provider Dept  09/26/22 Procedure visit Gillis Santa, MD [facility]  Showing today's visits and meeting all other requirements Future Appointments Date Type Provider Dept  11/21/22 Appointment Gillis Santa, MD [facility]  Showing future appointments within next 90 days and meeting all other requirements  Disposition: Discharge home  Discharge (Date  Time): 09/26/2022; 1023 hrs.   Primary Care Physician: Susy Frizzle, MD Location: [facility] Note by: Gillis Santa, MD (TTS technology used. I apologize for any typographical errors that were not detected and corrected.) Date: 09/26/2022; Time: 10:37 AM  Disclaimer:  Medicine is not an Chief Strategy Officer. The only guarantee in medicine is that nothing is guaranteed. It is important to note that the decision to proceed with this intervention was based on the information collected from the patient. The Data and conclusions were drawn from the patient's questionnaire, the interview, and the physical examination. Because the information was provided in large part by the patient, it cannot be guaranteed that it has not been purposely or unconsciously manipulated. Every effort has been made to obtain as much relevant data as possible for this evaluation. It is important to note that the conclusions that lead to this procedure are derived in large part from the available data. Always take into account that the treatment will also be dependent on availability of resources and existing treatment guidelines, considered by other Pain Management Practitioners as being common knowledge and practice, at the  time of the intervention. For  Medico-Legal purposes, it is also important to point out that variation in procedural techniques and pharmacological choices are the acceptable norm. The indications, contraindications, technique, and results of the above procedure should only be interpreted and judged by a Board-Certified Interventional Pain Specialist with extensive familiarity and expertise in the same exact procedure and technique.

## 2022-09-26 NOTE — Progress Notes (Signed)
Safety precautions to be maintained throughout the outpatient stay will include: orient to surroundings, keep bed in low position, maintain call bell within reach at all times, provide assistance with transfer out of bed and ambulation.  

## 2022-09-26 NOTE — Patient Instructions (Signed)

## 2022-09-27 ENCOUNTER — Telehealth: Payer: Self-pay

## 2022-09-27 NOTE — Telephone Encounter (Signed)
Post procedure follow up.  Patinet states she is doing good.  

## 2022-10-19 ENCOUNTER — Other Ambulatory Visit: Payer: Self-pay | Admitting: Student in an Organized Health Care Education/Training Program

## 2022-10-23 ENCOUNTER — Other Ambulatory Visit: Payer: Self-pay | Admitting: Student in an Organized Health Care Education/Training Program

## 2022-10-23 ENCOUNTER — Other Ambulatory Visit: Payer: Self-pay | Admitting: *Deleted

## 2022-10-23 ENCOUNTER — Encounter: Payer: Self-pay | Admitting: Student in an Organized Health Care Education/Training Program

## 2022-10-23 DIAGNOSIS — G8929 Other chronic pain: Secondary | ICD-10-CM

## 2022-10-23 DIAGNOSIS — G894 Chronic pain syndrome: Secondary | ICD-10-CM

## 2022-10-23 DIAGNOSIS — G5793 Unspecified mononeuropathy of bilateral lower limbs: Secondary | ICD-10-CM

## 2022-10-23 DIAGNOSIS — M5116 Intervertebral disc disorders with radiculopathy, lumbar region: Secondary | ICD-10-CM

## 2022-10-23 DIAGNOSIS — M47816 Spondylosis without myelopathy or radiculopathy, lumbar region: Secondary | ICD-10-CM

## 2022-10-23 DIAGNOSIS — M5136 Other intervertebral disc degeneration, lumbar region: Secondary | ICD-10-CM

## 2022-10-23 MED ORDER — CYCLOBENZAPRINE HCL 5 MG PO TABS
5.0000 mg | ORAL_TABLET | Freq: Three times a day (TID) | ORAL | 0 refills | Status: DC | PRN
Start: 2022-10-23 — End: 2023-02-11

## 2022-10-23 MED ORDER — PREGABALIN 100 MG PO CAPS
100.0000 mg | ORAL_CAPSULE | Freq: Two times a day (BID) | ORAL | 5 refills | Status: DC
Start: 2022-10-23 — End: 2022-11-21

## 2022-10-24 ENCOUNTER — Telehealth: Payer: Self-pay | Admitting: Student in an Organized Health Care Education/Training Program

## 2022-10-24 ENCOUNTER — Other Ambulatory Visit: Payer: Self-pay

## 2022-10-24 MED ORDER — CELECOXIB 100 MG PO CAPS: 100.0000 mg | ORAL_CAPSULE | Freq: Two times a day (BID) | ORAL | 0 refills | Status: DC

## 2022-10-24 NOTE — Telephone Encounter (Signed)
Refill request sent to Dr Lateef.  

## 2022-10-24 NOTE — Telephone Encounter (Signed)
Patient is not scheduled for med refill. She is out of celebrex generic. She needs script sent in

## 2022-11-10 ENCOUNTER — Other Ambulatory Visit: Payer: Self-pay | Admitting: Student in an Organized Health Care Education/Training Program

## 2022-11-10 DIAGNOSIS — G894 Chronic pain syndrome: Secondary | ICD-10-CM

## 2022-11-10 DIAGNOSIS — G5793 Unspecified mononeuropathy of bilateral lower limbs: Secondary | ICD-10-CM

## 2022-11-10 DIAGNOSIS — M549 Dorsalgia, unspecified: Secondary | ICD-10-CM

## 2022-11-10 DIAGNOSIS — M5412 Radiculopathy, cervical region: Secondary | ICD-10-CM

## 2022-11-21 ENCOUNTER — Other Ambulatory Visit: Payer: Self-pay

## 2022-11-21 ENCOUNTER — Ambulatory Visit
Payer: 59 | Attending: Student in an Organized Health Care Education/Training Program | Admitting: Student in an Organized Health Care Education/Training Program

## 2022-11-21 ENCOUNTER — Encounter: Payer: Self-pay | Admitting: Student in an Organized Health Care Education/Training Program

## 2022-11-21 DIAGNOSIS — M5116 Intervertebral disc disorders with radiculopathy, lumbar region: Secondary | ICD-10-CM | POA: Diagnosis not present

## 2022-11-21 DIAGNOSIS — G894 Chronic pain syndrome: Secondary | ICD-10-CM

## 2022-11-21 DIAGNOSIS — M47816 Spondylosis without myelopathy or radiculopathy, lumbar region: Secondary | ICD-10-CM

## 2022-11-21 DIAGNOSIS — G5793 Unspecified mononeuropathy of bilateral lower limbs: Secondary | ICD-10-CM

## 2022-11-21 DIAGNOSIS — M5136 Other intervertebral disc degeneration, lumbar region: Secondary | ICD-10-CM

## 2022-11-21 DIAGNOSIS — M5412 Radiculopathy, cervical region: Secondary | ICD-10-CM

## 2022-11-21 DIAGNOSIS — M549 Dorsalgia, unspecified: Secondary | ICD-10-CM

## 2022-11-21 MED ORDER — CELECOXIB 100 MG PO CAPS: 100.0000 mg | ORAL_CAPSULE | Freq: Two times a day (BID) | ORAL | 0 refills | Status: DC

## 2022-11-21 MED ORDER — NORTRIPTYLINE HCL 50 MG PO CAPS
50.0000 mg | ORAL_CAPSULE | Freq: Every day | ORAL | 5 refills | Status: DC
Start: 2022-11-21 — End: 2023-05-09

## 2022-11-21 MED ORDER — PREGABALIN 100 MG PO CAPS
100.0000 mg | ORAL_CAPSULE | Freq: Two times a day (BID) | ORAL | 5 refills | Status: DC
Start: 2022-11-21 — End: 2023-05-09

## 2022-11-21 NOTE — Addendum Note (Signed)
Addended by: Edward Jolly on: 11/21/2022 03:08 PM   Modules accepted: Orders

## 2022-11-21 NOTE — Progress Notes (Addendum)
Patient: Erica Walters  Service Category: E/M  Provider: Edward Jolly, MD  DOB: May 08, 1966  DOS: 11/21/2022  Location: Office  MRN: [id number]  Setting: Ambulatory outpatient  Referring Provider: Donita Brooks, MD  Type: Established Patient  Specialty: Interventional Pain Management  PCP: Donita Brooks, MD  Location: Remote location  Delivery: TeleHealth     Virtual Encounter - Pain Management PROVIDER NOTE: Information contained herein reflects review and annotations entered in association with encounter. Interpretation of such information and data should be left to medically-trained personnel. Information provided to patient can be located elsewhere in the medical record under "Patient Instructions". Document created using STT-dictation technology, any transcriptional errors that may result from process are unintentional.    Contact & Pharmacy Preferred: [phone] Home: [phone] (home) Mobile: [phone] (mobile) E-mail: [email or web address] [phone] [place] [facility], [place] - [address] AT [facility] [address] [place] [address] Phone: [phone] Fax: [phone]   Pre-screening  Ms. Bumann offered "in-person" vs "virtual" encounter. She indicated preferring virtual for this encounter.   Reason COVID-19*  Social distancing based on CDC and AMA recommendations.   I contacted Larisa Garstka on 11/21/2022 via telephone.      I clearly identified myself as Edward Jolly, MD. I verified that I was speaking with the correct person using two identifiers (Name: Marjo Bietz, and date of birth: [birth year]).  Consent I sought verbal advanced consent from Michaelene Song for virtual visit interactions. I informed Ms. Cassiano of possible security and privacy concerns, risks, and limitations associated with providing "not-in-person" medical evaluation and management services. I also informed Ms. Susko of the availability of "in-person"  appointments. Finally, I informed her that there would be a charge for the virtual visit and that she could be  personally, fully or partially, financially responsible for it. Ms. Abele expressed understanding and agreed to proceed.   Historic Elements   Ms. Kristyne Kindschi is a 57 y.o. year old, female patient evaluated today after our last contact on 11/10/2022. Ms. Canon  has a past medical history of Allergy, Arthritis, and Hyperlipidemia. She also  has a past surgical history that includes Total abdominal hysterectomy w/ bilateral salpingoophorectomy and Colonoscopy (12/04/2017). Ms. Bechtel has a current medication list which includes the following prescription(s): celecoxib, cyclobenzaprine, ibuprofen, nortriptyline, pregabalin, and tramadol. She  reports that she has been smoking cigarettes. She has a 13.00 pack-year smoking history. She has never used smokeless tobacco. She reports current alcohol use of about 14.0 standard drinks of alcohol per week. She reports that she does not use drugs. Ms. Polcyn has No Known Allergies.  BMI: Estimated body mass index is 32.28 kg/m as calculated from the following:   Height as of 09/26/22: 5\' 6"  (1.676 m).   Weight as of 09/26/22: 200 lb (90.7 kg). Last encounter: 08/13/2022. Last procedure: 09/26/2022.  HPI  Today, she is being contacted for a post-procedure assessment.   Post-procedure evaluation   Procedure: Lumbar Facet, Medial Branch Radiofrequency Ablation (RFA) #1  Laterality: Right (-RT)  Level: L3, L4, and L5 Medial Branch Level(s). These levels will denervate the L3-4 and L4-5 lumbar facet joints.  Imaging: Fluoroscopy-guided         Anesthesia: Local anesthesia (1-2% Lidocaine) Anxiolysis:5 mg PO Valium DOS: 09/26/2022  Performed by: Edward Jolly, MD  Purpose: Therapeutic/Palliative Indications: Low back pain severe enough to impact quality of life or function. Indications: 1. Lumbar facet arthropathy   2. Lumbar degenerative  disc disease  3. Chronic pain syndrome    Ms. Hetz has been dealing with the above chronic pain for longer than three months and has either failed to respond, was unable to tolerate, or simply did not get enough benefit from other more conservative therapies including, but not limited to: 1. Over-the-counter medications 2. Anti-inflammatory medications 3. Muscle relaxants 4. Membrane stabilizers 5. Opioids 6. Physical therapy and/or chiropractic manipulation 7. Modalities (Heat, ice, etc.) 8. Invasive techniques such as nerve blocks. Ms. Bendell has attained more than 50% relief of the pain from a series of diagnostic injections conducted in separate occasions.  Pain Score: Pre-procedure: 2 /10 Post-procedure: 0-No pain/10     Effectiveness:  Initial hour after procedure: 80 %  Subsequent 4-6 hours post-procedure: 80 %  Analgesia past initial 6 hours: 80 % (at least 4 weeks and slowly returning to baseline.)  Ongoing improvement:  Analgesic:  back to baseline Function: Back to baseline ROM: Back to baseline   Laboratory Chemistry Profile   Renal Lab Results  Component Value Date   BUN 11 01/23/2019   CREATININE 0.81 01/23/2019   BCR NOT APPLICABLE 01/23/2019   GFRAA 97 01/23/2019   GFRNONAA 84 01/23/2019    Hepatic Lab Results  Component Value Date   AST 44 (H) 01/23/2019   ALT 62 (H) 01/23/2019   ALBUMIN 4.1 07/19/2015   ALKPHOS 83 07/19/2015   LIPASE 30 07/19/2015    Electrolytes Lab Results  Component Value Date   NA 141 01/23/2019   K 4.8 01/23/2019   CL 106 01/23/2019   CALCIUM 9.7 01/23/2019    Bone Lab Results  Component Value Date   VD25OH 31 01/23/2019    Inflammation (CRP: Acute Phase) (ESR: Chronic Phase) Lab Results  Component Value Date   CRP 4.7 12/25/2018   ESRSEDRATE 14 12/25/2018         Note: Above Lab results reviewed.    Assessment  The primary encounter diagnosis was Lumbar facet arthropathy. Diagnoses of Lumbar  degenerative disc disease, Lumbar disc herniation with radiculopathy, Chronic pain syndrome, and Neuropathy involving both lower extremities were also pertinent to this visit.  Plan of Care  Patient states that she had an excellent response to her lumbar radiofrequency ablation and she experienced significant pain relief and functional improvement for the first 4 to 6 weeks but now her pain has returned back to normal.  She is having increased pain while she is working.  I would like to see her back in about 3 to 4 weeks to discuss Sprint peripheral nerve stimulation for the lumbar medial branch nerves.  At that visit, I will explain to her what the coiled electrode lead looks like as well as the battery pack and what the procedure entails.  Patient in agreement with plan.  Refill of nortriptyline, Lyrica and Celebrex as well.  Requested Prescriptions   Signed Prescriptions Disp Refills   pregabalin (LYRICA) 100 MG capsule 60 capsule 5    Sig: Take 1 capsule (100 mg total) by mouth 2 (two) times daily.   celecoxib (CELEBREX) 100 MG capsule 60 capsule 0    Sig: Take 1 capsule (100 mg total) by mouth 2 (two) times daily.      Follow-up plan:   Return in about 29 days (around 12/20/2022) for F2F to discuss SPRINT PNS MBB (s.p RFA).      Recent Visits Date Type Provider Dept  09/26/22 Procedure visit Edward Jolly, MD [facility]  09/12/22 Procedure visit Edward Jolly, MD  [facility]  Showing recent visits within past 90 days and meeting all other requirements Today's Visits Date Type Provider Dept  11/21/22 Office Visit Edward Jolly, MD [facility]  Showing today's visits and meeting all other requirements Future Appointments No visits were found meeting these conditions. Showing future appointments within next 90 days and meeting all other requirements  I discussed the assessment and treatment plan with the patient. The patient was provided an  opportunity to ask questions and all were answered. The patient agreed with the plan and demonstrated an understanding of the instructions.  Patient advised to call back or seek an in-person evaluation if the symptoms or condition worsens.  Duration of encounter: .  Note by: Edward Jolly, MD Date: 11/21/2022; Time: 3:08 PM

## 2022-11-23 ENCOUNTER — Encounter: Payer: Self-pay | Admitting: Student in an Organized Health Care Education/Training Program

## 2022-11-26 ENCOUNTER — Other Ambulatory Visit: Payer: Self-pay | Admitting: *Deleted

## 2022-11-26 MED ORDER — CELECOXIB 100 MG PO CAPS: 100.0000 mg | ORAL_CAPSULE | Freq: Two times a day (BID) | ORAL | 1 refills | Status: DC

## 2022-11-26 NOTE — Telephone Encounter (Signed)
Rx request sent to Dr. Lateef 

## 2022-12-06 ENCOUNTER — Other Ambulatory Visit: Payer: Self-pay

## 2022-12-06 DIAGNOSIS — M5030 Other cervical disc degeneration, unspecified cervical region: Secondary | ICD-10-CM

## 2022-12-06 DIAGNOSIS — M47816 Spondylosis without myelopathy or radiculopathy, lumbar region: Secondary | ICD-10-CM

## 2022-12-06 DIAGNOSIS — M5412 Radiculopathy, cervical region: Secondary | ICD-10-CM

## 2022-12-06 DIAGNOSIS — G5793 Unspecified mononeuropathy of bilateral lower limbs: Secondary | ICD-10-CM

## 2022-12-06 DIAGNOSIS — M5116 Intervertebral disc disorders with radiculopathy, lumbar region: Secondary | ICD-10-CM

## 2022-12-06 DIAGNOSIS — D271 Benign neoplasm of left ovary: Secondary | ICD-10-CM

## 2022-12-20 ENCOUNTER — Encounter: Payer: Self-pay | Admitting: Student in an Organized Health Care Education/Training Program

## 2022-12-20 ENCOUNTER — Ambulatory Visit
Admission: RE | Admit: 2022-12-20 | Discharge: 2022-12-20 | Disposition: A | Discharge status: Home / Self Care | Payer: 59 | Source: Ambulatory Visit | Attending: Student in an Organized Health Care Education/Training Program | Admitting: Student in an Organized Health Care Education/Training Program

## 2022-12-20 ENCOUNTER — Ambulatory Visit (HOSPITAL_BASED_OUTPATIENT_CLINIC_OR_DEPARTMENT_OTHER): Payer: 59 | Admitting: Student in an Organized Health Care Education/Training Program

## 2022-12-20 ENCOUNTER — Ambulatory Visit
Admission: RE | Admit: 2022-12-20 | Discharge: 2022-12-20 | Disposition: A | Discharge status: Home / Self Care | Payer: 59 | Source: Ambulatory Visit | Attending: Family Medicine | Admitting: Family Medicine

## 2022-12-20 VITALS — BP 136/89 | HR 97 | Temp 96.8°F | Ht 66.0 in | Wt 200.0 lb

## 2022-12-20 DIAGNOSIS — M5136 Other intervertebral disc degeneration, lumbar region: Secondary | ICD-10-CM | POA: Diagnosis not present

## 2022-12-20 DIAGNOSIS — G5702 Lesion of sciatic nerve, left lower limb: Secondary | ICD-10-CM

## 2022-12-20 DIAGNOSIS — M549 Dorsalgia, unspecified: Secondary | ICD-10-CM | POA: Insufficient documentation

## 2022-12-20 DIAGNOSIS — M4726 Other spondylosis with radiculopathy, lumbar region: Secondary | ICD-10-CM | POA: Diagnosis not present

## 2022-12-20 DIAGNOSIS — M47816 Spondylosis without myelopathy or radiculopathy, lumbar region: Secondary | ICD-10-CM | POA: Insufficient documentation

## 2022-12-20 DIAGNOSIS — M533 Sacrococcygeal disorders, not elsewhere classified: Secondary | ICD-10-CM | POA: Insufficient documentation

## 2022-12-20 DIAGNOSIS — M5116 Intervertebral disc disorders with radiculopathy, lumbar region: Secondary | ICD-10-CM | POA: Insufficient documentation

## 2022-12-20 DIAGNOSIS — G894 Chronic pain syndrome: Secondary | ICD-10-CM | POA: Insufficient documentation

## 2022-12-20 DIAGNOSIS — G8929 Other chronic pain: Secondary | ICD-10-CM

## 2022-12-20 DIAGNOSIS — M5416 Radiculopathy, lumbar region: Secondary | ICD-10-CM

## 2022-12-20 NOTE — Progress Notes (Signed)
Safety precautions to be maintained throughout the outpatient stay will include: orient to surroundings, keep bed in low position, maintain call bell within reach at all times, provide assistance with transfer out of bed and ambulation.  

## 2022-12-20 NOTE — Progress Notes (Signed)
PROVIDER NOTE: Information contained herein reflects review and annotations entered in association with encounter. Interpretation of such information and data should be left to medically-trained personnel. Information provided to patient can be located elsewhere in the medical record under "Patient Instructions". Document created using STT-dictation technology, any transcriptional errors that may result from process are unintentional.    Patient: Erica Walters  Service Category: E/M  Provider: Edward Jolly, MD  DOB: [birth year]  DOS: 12/20/2022  Referring Provider: Donita Brooks, MD  MRN: [id number]  Specialty: Interventional Pain Management  PCP: Donita Brooks, MD  Type: Established Patient  Setting: Ambulatory outpatient    Location: Office  Delivery: Face-to-face     HPI  Ms. Erica Walters, a 57 y.o. year old female, is here today because of her Chronic left SI joint pain [M53.3, G89.29]. Ms. Erica Walters primary complain today is Back Pain (lower)  Pertinent problems: Ms. Erica Walters has Cervical radiculopathy; DDD (degenerative disc disease), cervical; and Bilateral plantar fasciitis on their pertinent problem list. Pain Assessment: Severity of Chronic pain is reported as a 3 /10. Location: Back Lower/radiates to groin area. Onset: More than a month ago. Quality: Constant, Aching. Timing: Constant. Modifying factor(s): rest. Vitals:  height is 5\' 6"  (1.676 m) and weight is 200 lb (90.7 kg). Her temporal temperature is 96.8 F (36 C) (abnormal). Her blood pressure is 136/89 and her pulse is 97. Her oxygen saturation is 97%.  BMI: Estimated body mass index is 32.28 kg/m as calculated from the following:   Height as of this encounter: 5\' 6"  (1.676 m).   Weight as of this encounter: 200 lb (90.7 kg). Last encounter: 11/21/2022. Last procedure: 09/26/2022.  Reason for encounter: evaluation of worsening, or previously known (established) problem.   Increased low back pain and associated  pressure, overlying L4-5 and up to L1/2 region Has occasional groin radation in the left  S/p  Lumbar L3,4,5 RFA April and May with no long term benefit unfortunately. She did receive benefit with her diagnostic nerve blocks Lumbar MRI from 01/20/2022 shows diffuse disc bulge with disc desiccation at L1-L2 superimposed tiny left subarticular disc extrusion with inferior migration which could be impacting the left L2 nerve root.  We discussed a left L1-L2 interlaminar epidural steroid injection. I would also like to get a x-ray of her left SI joint and consider a left SI joint and left piriformis injection for her left buttock pain.    ROS  Constitutional: Denies any fever or chills Gastrointestinal: No reported hemesis, hematochezia, vomiting, or acute GI distress Musculoskeletal:  Low back, left buttock, left groin Neurological: No reported episodes of acute onset apraxia, aphasia, dysarthria, agnosia, amnesia, paralysis, loss of coordination, or loss of consciousness  Medication Review  celecoxib, cyclobenzaprine, ibuprofen, nortriptyline, pregabalin, and traMADol  History Review  Allergy: Ms. Erica Walters has No Known Allergies. Drug: Ms. Erica Walters  reports no history of drug use. Alcohol:  reports current alcohol use of about 14.0 standard drinks of alcohol per week. Tobacco:  reports that she has been smoking cigarettes. She has a 13.00 pack-year smoking history. She has never used smokeless tobacco. Social: Ms. Erica Walters  reports that she has been smoking cigarettes. She has a 13.00 pack-year smoking history. She has never used smokeless tobacco. She reports current alcohol use of about 14.0 standard drinks of alcohol per week. She reports that she does not use drugs. Medical:  has a past medical history of Allergy, Arthritis, and Hyperlipidemia. Surgical: Ms. Erica Walters  has a past surgical history  that includes Total abdominal hysterectomy w/ bilateral salpingoophorectomy and Colonoscopy  (12/04/2017). Family: family history includes Colon cancer in her paternal grandfather.  Laboratory Chemistry Profile   Renal Lab Results  Component Value Date   BUN 11 01/23/2019   CREATININE 0.81 01/23/2019   BCR NOT APPLICABLE 01/23/2019   GFRAA 97 01/23/2019   GFRNONAA 84 01/23/2019    Hepatic Lab Results  Component Value Date   AST 44 (H) 01/23/2019   ALT 62 (H) 01/23/2019   ALBUMIN 4.1 07/19/2015   ALKPHOS 83 07/19/2015   LIPASE 30 07/19/2015    Electrolytes Lab Results  Component Value Date   NA 141 01/23/2019   K 4.8 01/23/2019   CL 106 01/23/2019   CALCIUM 9.7 01/23/2019    Bone Lab Results  Component Value Date   VD25OH 31 01/23/2019    Inflammation (CRP: Acute Phase) (ESR: Chronic Phase) Lab Results  Component Value Date   CRP 4.7 12/25/2018   ESRSEDRATE 14 12/25/2018         Note: Above Lab results reviewed.  Recent Imaging Review  DG PAIN CLIN[facility] C-ARM 1-60 MIN NO REPORT Fluoro was used, but no Radiologist interpretation will be provided.  Please refer to "NOTES" tab for provider progress note. Note: Reviewed        Physical Exam  General appearance: Well nourished, well developed, and well hydrated. In no apparent acute distress Mental status: Alert, oriented x 3 (person, place, & time)       Respiratory: No evidence of acute respiratory distress Eyes: PERLA Vitals: BP 136/89   Pulse 97   Temp (!) 96.8 F (36 C) (Temporal)   Ht 5\' 6"  (1.676 m)   Wt 200 lb (90.7 kg)   SpO2 97%   BMI 32.28 kg/m  BMI: Estimated body mass index is 32.28 kg/m as calculated from the following:   Height as of this encounter: 5\' 6"  (1.676 m).   Weight as of this encounter: 200 lb (90.7 kg). Ideal: Ideal body weight: 59.3 kg (130 lb 11.7 oz) Adjusted ideal body weight: 71.9 kg (158 lb 7 oz)  Lumbar Spine Area Exam  Skin & Axial Inspection: No masses, redness, or swelling Alignment: Symmetrical Functional ROM: Unrestricted ROM       Stability: No  instability detected Muscle Tone/Strength: Functionally intact. No obvious neuro-muscular anomalies detected. Sensory (Neurological): Dermatomal pain pattern left L1-L2 Palpation: No palpable anomalies       Provocative Tests: Hyperextension/rotation test: (+) due to pain. Lumbar quadrant test (Kemp's test): (+) on the left for foraminal stenosis Lateral bending test: (+) due to pain. Patrick's Maneuver: (+) for left-sided S-I arthralgia             FABER* test: (+) for left-sided S-I arthralgia  S-I anterior distraction/compression test:(+) for left-sided S-I arthralgia  S-I lateral compression test:(+) for left-sided S-I arthralgia  S-I Thigh-thrust test: (+) for left-sided S-I arthralgia  S-I Gaenslen's test: (+) for left-sided S-I arthralgia  *(Flexion, ABduction and External Rotation) Gait & Posture Assessment  Ambulation: Unassisted Gait: Relatively normal for age and body habitus Posture: WNL  Lower Extremity Exam    Side: Right lower extremity  Side: Left lower extremity  Stability: No instability observed          Stability: No instability observed          Skin & Extremity Inspection: Skin color, temperature, and hair growth are WNL. No peripheral edema or cyanosis. No masses, redness, swelling, asymmetry, or associated skin lesions.  No contractures.  Skin & Extremity Inspection: Skin color, temperature, and hair growth are WNL. No peripheral edema or cyanosis. No masses, redness, swelling, asymmetry, or associated skin lesions. No contractures.  Functional ROM: Unrestricted ROM                  Functional ROM: Unrestricted ROM                  Muscle Tone/Strength: Functionally intact. No obvious neuro-muscular anomalies detected.  Muscle Tone/Strength: Functionally intact. No obvious neuro-muscular anomalies detected.  Sensory (Neurological): Unimpaired        Sensory (Neurological): Unimpaired        DTR: Patellar: deferred today Achilles: deferred today Plantar: deferred  today  DTR: Patellar: deferred today Achilles: deferred today Plantar: deferred today  Palpation: No palpable anomalies  Palpation: No palpable anomalies    Assessment   Diagnosis Status  1. Chronic left SI joint pain   2. Piriformis syndrome of left side   3. Lumbar facet arthropathy   4. Lumbar degenerative disc disease   5. Lumbar disc herniation with radiculopathy   6. Chronic pain syndrome   7. Lumbar radicular pain    Having a Flare-up Having a Flare-up Persistent     Plan of Care   Orders:  Orders Placed This Encounter  Procedures   Lumbar Epidural Injection    Standing Status:   Future    Standing Expiration Date:   03/22/2023    Scheduling Instructions:     Procedure: Interlaminar Lumbar Epidural Steroid injection (LESI)            Laterality: without     Timeframe: ASAA    Order Specific Question:   Where will this procedure be performed?    Answer:   [facility]   SACROILIAC JOINT INJECTION    Standing Status:   Future    Standing Expiration Date:   03/22/2023    Scheduling Instructions:     Side: RIGHT     Sedation: Patient's choice.     Timeframe: ASAP    Order Specific Question:   Where will this procedure be performed?    Answer:   [facility]   TRIGGER POINT INJECTION    Area: Buttocks region (gluteal area) Indications: Piriformis muscle pain; Right (G57.01) piriformis-syndrome; piriformis muscle spasms (W11.914). CPT code: 78295    Standing Status:   Future    Standing Expiration Date:   12/20/2023    Scheduling Instructions:     Right piriformis TPI    Order Specific Question:   Where will this procedure be performed?    Answer:   [facility]   DG Si Joints    Standing Status:   Future    Standing Expiration Date:   12/20/2023    Order Specific Question:   Reason for Exam (SYMPTOM  OR DIAGNOSIS REQUIRED)    Answer:   left SI joint pain    Order Specific Question:   Is patient pregnant?    Answer:   No    Order  Specific Question:   Preferred imaging location?    Answer:   [facility]   Follow-up plan:   Return in about 20 days (around 01/09/2023) for Left L1/2 IL ESI, Left SI-J, Piriformis TPI, in clinic NS.     Recent Visits Date Type Provider Dept  11/21/22 Office Visit Edward Jolly, MD [facility]  09/26/22 Procedure visit Edward Jolly, MD [facility]  Showing  recent visits within past 90 days and meeting all other requirements Today's Visits Date Type Provider Dept  12/20/22 Office Visit Edward Jolly, MD [facility]  Showing today's visits and meeting all other requirements Future Appointments No visits were found meeting these conditions. Showing future appointments within next 90 days and meeting all other requirements  I discussed the assessment and treatment plan with the patient. The patient was provided an opportunity to ask questions and all were answered. The patient agreed with the plan and demonstrated an understanding of the instructions.  Patient advised to call back or seek an in-person evaluation if the symptoms or condition worsens.  Duration of encounter: .  Total time on encounter, as per AMA guidelines included both the face-to-face and non-face-to-face time personally spent by the physician and/or other qualified health care professional(s) on the day of the encounter (includes time in activities that require the physician or other qualified health care professional and does not include time in activities normally performed by clinical staff). Physician's time may include the following activities when performed: Preparing to see the patient (e.g., pre-charting review of records, searching for previously ordered imaging, lab work, and nerve conduction tests) Review of prior analgesic pharmacotherapies. Reviewing PMP Interpreting ordered tests (e.g., lab work, imaging, nerve conduction tests) Performing post-procedure  evaluations, including interpretation of diagnostic procedures Obtaining and/or reviewing separately obtained history Performing a medically appropriate examination and/or evaluation Counseling and educating the patient/family/caregiver Ordering medications, tests, or procedures Referring and communicating with other health care professionals (when not separately reported) Documenting clinical information in the electronic or other health record Independently interpreting results (not separately reported) and communicating results to the patient/ family/caregiver Care coordination (not separately reported)  Note by: Edward Jolly, MD Date: 12/20/2022; Time: 9:55 AM

## 2023-01-14 ENCOUNTER — Encounter: Payer: Self-pay | Admitting: Student in an Organized Health Care Education/Training Program

## 2023-01-14 ENCOUNTER — Ambulatory Visit
Payer: 59 | Attending: Student in an Organized Health Care Education/Training Program | Admitting: Student in an Organized Health Care Education/Training Program

## 2023-01-14 ENCOUNTER — Ambulatory Visit
Admission: RE | Admit: 2023-01-14 | Discharge: 2023-01-14 | Disposition: A | Discharge status: Home / Self Care | Payer: 59 | Source: Ambulatory Visit | Attending: Student in an Organized Health Care Education/Training Program | Admitting: Student in an Organized Health Care Education/Training Program

## 2023-01-14 VITALS — BP 132/89 | HR 98 | Temp 97.4°F | Resp 16 | Ht 66.0 in | Wt 200.0 lb

## 2023-01-14 DIAGNOSIS — G5702 Lesion of sciatic nerve, left lower limb: Secondary | ICD-10-CM | POA: Insufficient documentation

## 2023-01-14 DIAGNOSIS — G8929 Other chronic pain: Secondary | ICD-10-CM | POA: Diagnosis not present

## 2023-01-14 DIAGNOSIS — M533 Sacrococcygeal disorders, not elsewhere classified: Secondary | ICD-10-CM | POA: Insufficient documentation

## 2023-01-14 DIAGNOSIS — M5116 Intervertebral disc disorders with radiculopathy, lumbar region: Secondary | ICD-10-CM | POA: Insufficient documentation

## 2023-01-14 DIAGNOSIS — M5416 Radiculopathy, lumbar region: Secondary | ICD-10-CM

## 2023-01-14 MED ORDER — TRAMADOL HCL 50 MG PO TABS: 50.0000 mg | ORAL_TABLET | Freq: Three times a day (TID) | ORAL | 1 refills | Status: DC | PRN

## 2023-01-14 MED ORDER — LIDOCAINE HCL 2 % IJ SOLN
20.0000 mL | Freq: Once | INTRAMUSCULAR | Status: AC
  Administered 2023-01-14: 400 mg
  Filled 2023-01-14: qty 20

## 2023-01-14 MED ORDER — ROPIVACAINE HCL 2 MG/ML IJ SOLN
2.0000 mL | Freq: Once | INTRAMUSCULAR | Status: AC
  Administered 2023-01-14: 2 mL via EPIDURAL
  Filled 2023-01-14: qty 20

## 2023-01-14 MED ORDER — METHYLPREDNISOLONE ACETATE 40 MG/ML IJ SUSP
40.0000 mg | Freq: Once | INTRAMUSCULAR | Status: AC
  Administered 2023-01-14: 40 mg via INTRA_ARTICULAR
  Filled 2023-01-14: qty 1

## 2023-01-14 MED ORDER — ROPIVACAINE HCL 2 MG/ML IJ SOLN
4.0000 mL | Freq: Once | INTRAMUSCULAR | Status: AC
  Administered 2023-01-14: 4 mL via INTRA_ARTICULAR

## 2023-01-14 MED ORDER — DEXAMETHASONE SODIUM PHOSPHATE 10 MG/ML IJ SOLN
10.0000 mg | Freq: Once | INTRAMUSCULAR | Status: AC
  Administered 2023-01-14: 10 mg
  Filled 2023-01-14: qty 1

## 2023-01-14 MED ORDER — SODIUM CHLORIDE (PF) 0.9 % IJ SOLN
INTRAMUSCULAR | Status: AC
  Filled 2023-01-14: qty 10

## 2023-01-14 MED ORDER — SODIUM CHLORIDE 0.9% FLUSH
2.0000 mL | Freq: Once | INTRAVENOUS | Status: AC
  Administered 2023-01-14: 2 mL

## 2023-01-14 MED ORDER — IOHEXOL 180 MG/ML  SOLN
10.0000 mL | Freq: Once | INTRAMUSCULAR | Status: AC
  Administered 2023-01-14: 10 mL via EPIDURAL
  Filled 2023-01-14: qty 20

## 2023-01-14 NOTE — Progress Notes (Signed)
Nursing Pain Medication Assessment:  Safety precautions to be maintained throughout the outpatient stay will include: orient to surroundings, keep bed in low position, maintain call bell within reach at all times, provide assistance with transfer out of bed and ambulation.  Medication Inspection Compliance: Pill count conducted under aseptic conditions, in front of the patient. Neither the pills nor the bottle was removed from the patient's sight at any time. Once count was completed pills were immediately returned to the patient in their original bottle.  Medication: Tramadol (Ultram) Pill/Patch Count:  1 of 90 pills remain Pill/Patch Appearance: Markings consistent with prescribed medication Bottle Appearance: Standard pharmacy container. Clearly labeled. Filled Date: 05 / 09 / 2024 Last Medication intake:  TodaySafety precautions to be maintained throughout the outpatient stay will include: orient to surroundings, keep bed in low position, maintain call bell within reach at all times, provide assistance with transfer out of bed and ambulation.

## 2023-01-14 NOTE — Progress Notes (Signed)
PROVIDER NOTE: Interpretation of information contained herein should be left to medically-trained personnel. Specific patient instructions are provided elsewhere under "Patient Instructions" section of medical record. This document was created in part using STT-dictation technology, any transcriptional errors that may result from this process are unintentional.  Patient: Erica Walters Type: Established DOB: Dec 10, 1965 MRN: [id number] PCP: Donita Brooks, MD  Service: Procedure DOS: 01/14/2023 Setting: Ambulatory Location: Ambulatory outpatient facility Delivery: Face-to-face Provider: Edward Jolly, MD Specialty: Interventional Pain Management Specialty designation: 09 Location: Outpatient facility Ref. Prov.: Edward Jolly, MD       Interventional Therapy   Procedure: Sacroiliac Joint Steroid Injection #1  & Left Piriformis TPI Laterality: Left     Level: PIIS (Posterior Inferior Iliac Spine)  Imaging: Fluoroscopic guidance Anesthesia: Local anesthesia (1-2% Lidocaine) DOS: 01/14/2023  Performed by: Edward Jolly, MD  Purpose: Diagnostic/Therapeutic Indications: Sacroiliac joint pain in the lower back and hip area severe enough to impact quality of life or function. Rationale (medical necessity): procedure needed and proper for the diagnosis and/or treatment of [name]'s medical symptoms and needs. 1. Lumbar disc herniation with radiculopathy   2. Chronic left SI joint pain   3. Piriformis syndrome of left side   4. Lumbar radicular pain   5. Chronic radicular lumbar pain    NAS-11 Pain score:   Pre-procedure: 3 /10   Post-procedure: 0-No pain/10     Target: Interarticular sacroiliac joint. Location: Medial to the postero-medial edge of iliac spine. Region: Lumbosacral-sacrococcygeal. Approach: Inferior postero-medial percutaneous approach. Type of procedure: Percutaneous joint injection.  Position / Prep / Materials:  Position: Prone  Prep solution: DuraPrep  (Iodine Povacrylex [0.7% available iodine] and Isopropyl Alcohol, 74% w/w) Prep Area: Entire posterior lumbosacral area  Materials:  Tray: Block Needle(s):  Type: Spinal  Gauge (G): 22  Length: 3.5-in Qty: 1  H&P (Pre-op Assessment):  [name] is a 57 y.o. (year old), female patient, seen today for interventional treatment. She  has a past surgical history that includes Total abdominal hysterectomy w/ bilateral salpingoophorectomy and Colonoscopy (12/04/2017). [name] has a current medication list which includes the following prescription(s): celecoxib, cyclobenzaprine, ibuprofen, nortriptyline, pregabalin, and tramadol. Her primarily concern today is the Back Pain (lower)  Initial Vital Signs:  Pulse/HCG Rate: 98ECG Heart Rate: 100 Temp: (!) 97.4 F (36.3 C) Resp: 18 BP: (!) 124/108 SpO2: 100 %  BMI: Estimated body mass index is 32.28 kg/m as calculated from the following:   Height as of this encounter: 5\' 6"  (1.676 m).   Weight as of this encounter: 200 lb (90.7 kg).  Risk Assessment: Allergies: Reviewed. She has No Known Allergies.  Allergy Precautions: None required Coagulopathies: Reviewed. None identified.  Blood-thinner therapy: None at this time Active Infection(s): Reviewed. None identified. [name] is afebrile  Site Confirmation: [name] was asked to confirm the procedure and laterality before marking the site Procedure checklist: Completed Consent: Before the procedure and under the influence of no sedative(s), amnesic(s), or anxiolytics, the patient was informed of the treatment options, risks and possible complications. To fulfill our ethical and legal obligations, as recommended by the American Medical Association's Code of Ethics, I have informed the patient of my clinical impression; the nature and purpose of the treatment or procedure; the risks, benefits, and possible complications of the intervention; the alternatives, including doing nothing;  the risk(s) and benefit(s) of the alternative treatment(s) or procedure(s); and the risk(s) and benefit(s) of doing nothing. The patient was provided information about the general risks and  possible complications associated with the procedure. These may include, but are not limited to: failure to achieve desired goals, infection, bleeding, organ or nerve damage, allergic reactions, paralysis, and death. In addition, the patient was informed of those risks and complications associated to the procedure, such as failure to decrease pain; infection; bleeding; organ or nerve damage with subsequent damage to sensory, motor, and/or autonomic systems, resulting in permanent pain, numbness, and/or weakness of one or several areas of the body; allergic reactions; (i.e.: anaphylactic reaction); and/or death. Furthermore, the patient was informed of those risks and complications associated with the medications. These include, but are not limited to: allergic reactions (i.e.: anaphylactic or anaphylactoid reaction(s)); adrenal axis suppression; blood sugar elevation that in diabetics may result in ketoacidosis or comma; water retention that in patients with history of congestive heart failure may result in shortness of breath, pulmonary edema, and decompensation with resultant heart failure; weight gain; swelling or edema; medication-induced neural toxicity; particulate matter embolism and blood vessel occlusion with resultant organ, and/or nervous system infarction; and/or aseptic necrosis of one or more joints. Finally, the patient was informed that Medicine is not an exact science; therefore, there is also the possibility of unforeseen or unpredictable risks and/or possible complications that may result in a catastrophic outcome. The patient indicated having understood very clearly. We have given the patient no guarantees and we have made no promises. Enough time was given to the patient to ask questions, all of which  were answered to the patient's satisfaction. [name] has indicated that she wanted to continue with the procedure. Attestation: I, the ordering provider, attest that I have discussed with the patient the benefits, risks, side-effects, alternatives, likelihood of achieving goals, and potential problems during recovery for the procedure that I have provided informed consent. Date  Time: 01/14/2023 12:45 PM  Pre-Procedure Preparation:  Monitoring: As per clinic protocol. Respiration, ETCO2, SpO2, BP, heart rate and rhythm monitor placed and checked for adequate function Safety Precautions: Patient was assessed for positional comfort and pressure points before starting the procedure. Time-out: I initiated and conducted the "Time-out" before starting the procedure, as per protocol. The patient was asked to participate by confirming the accuracy of the "Time Out" information. Verification of the correct person, site, and procedure were performed and confirmed by me, the nursing staff, and the patient. "Time-out" conducted as per Joint Commission's Universal Protocol (UP.01.01.01). Time: 1337 Start Time: 1337 hrs.  Description/Narrative of Procedure:          Start Time: 1337 hrs.  Rationale (medical necessity): procedure needed and proper for the diagnosis and/or treatment of the patient's medical symptoms and needs. Procedural Technique Safety Precautions: Aspiration looking for blood return was conducted prior to all injections. At no point did we inject any substances, as a needle was being advanced. No attempts were made at seeking any paresthesias. Safe injection practices and needle disposal techniques used. Medications properly checked for expiration dates. SDV (single dose vial) medications used. Description of the Procedure: Protocol guidelines were followed. The patient was assisted into a comfortable position. The target area was identified and the area prepped in the usual manner. Skin &  deeper tissues infiltrated with local anesthetic. Appropriate amount of time allowed to pass for local anesthetics to take effect. The procedure needles were then advanced to the target area. Proper needle placement secured. Negative aspiration confirmed. Solution injected in intermittent fashion, asking for systemic symptoms every 0.5cc of injectate. The needles were then removed and the area cleansed,  making sure to leave some of the prepping solution back to take advantage of its long term bactericidal properties.  Technical description of procedure:  Fluoroscopy using a posterior anterior 45 degree angle from the midline aiming at the anterolateral aspect of the patient was used to find a direct path into the sacroiliac joint, the superior medial to posterior superior iliac spine.  The skin was marked where the desired target and the skin infiltrated with local anesthetics.  The procedure needle was then advanced until the joint was entered.  Once inside of the joint, we then proceeded to inject the desired solution. 5 cc solution made of 4.5 cc of 0.2% ropivacaine, 0.5 cc of methylprednisolone, 40 mg/cc.  Injected into the left SI joint after contrast confirmation   LEFT piriformis trigger point injection was done 1 cm inferior, 1 cm deep, 1 cm lateral to the inferior fissure of the SI joint.  Contrast was injected to confirm piriformis muscle striation.  5 cc solution made of 4.5 cc of 0.2% ropivacaine, 0.5 cc of methylprednisolone (40 mg/cc.)  was injected.  While injecting, patient did not complain of any pain radiating down her left leg.   Vitals:   01/14/23 1333 01/14/23 1338 01/14/23 1343 01/14/23 1345  BP: (!) 139/102 (!) 142/107 (!) 133/92 132/89  Pulse:      Resp: (!) 22 20 20 16   Temp:      TempSrc:      SpO2: 94% 94% 95% 95%  Weight:      Height:         End Time: 1344 hrs.  Imaging Guidance (Non-Spinal):          Type of Imaging Technique: Fluoroscopy Guidance  (Non-Spinal) Indication(s): Assistance in needle guidance and placement for procedures requiring needle placement in or near specific anatomical locations not easily accessible without such assistance. Exposure Time: Please see nurses notes. Contrast: Before injecting any contrast, we confirmed that the patient did not have an allergy to iodine, shellfish, or radiological contrast. Once satisfactory needle placement was completed at the desired level, radiological contrast was injected. Contrast injected under live fluoroscopy. No contrast complications. See chart for type and volume of contrast used. Fluoroscopic Guidance: I was personally present during the use of fluoroscopy. "Tunnel Vision Technique" used to obtain the best possible view of the target area. Parallax error corrected before commencing the procedure. "Direction-depth-direction" technique used to introduce the needle under continuous pulsed fluoroscopy. Once target was reached, antero-posterior, oblique, and lateral fluoroscopic projection used confirm needle placement in all planes. Images permanently stored in EMR. Interpretation: I personally interpreted the imaging intraoperatively. Adequate needle placement confirmed in multiple planes. Appropriate spread of contrast into desired area was observed. No evidence of afferent or efferent intravascular uptake. Permanent images saved into the patient's record.  Post-operative Assessment:  Post-procedure Vital Signs:  Pulse/HCG Rate: 9898 Temp: (!) 97.4 F (36.3 C) Resp: 16 BP: 132/89 SpO2: 95 %  EBL: None  Complications: No immediate post-treatment complications observed by team, or reported by patient.  Note: The patient tolerated the entire procedure well. A repeat set of vitals were taken after the procedure and the patient was kept under observation following institutional policy, for this type of procedure. Post-procedural neurological assessment was performed, showing return  to baseline, prior to discharge. The patient was provided with post-procedure discharge instructions, including a section on how to identify potential problems. Should any problems arise concerning this procedure, the patient was given instructions to immediately contact us,  at any time, without hesitation. In any case, we plan to contact the patient by telephone for a follow-up status report regarding this interventional procedure.  Comments:  No additional relevant information.  Plan of Care (POC)  Orders:  Orders Placed This Encounter  Procedures   DG PAIN [facility] C-ARM 1-60 MIN NO REPORT    Intraoperative interpretation by procedural physician at [facility].    Standing Status:   Standing    Number of Occurrences:   1    Order Specific Question:   Reason for exam:    Answer:   Assistance in needle guidance and placement for procedures requiring needle placement in or near specific anatomical locations not easily accessible without such assistance.   Medications ordered for procedure: Refill of tramadol as well.  PMP checked and reviewed. Meds ordered this encounter  Medications   iohexol (OMNIPAQUE) 180 MG/ML injection 10 mL    Must be Myelogram-compatible. If not available, you may substitute with a water-soluble, non-ionic, hypoallergenic, myelogram-compatible radiological contrast medium.   lidocaine (XYLOCAINE) 2 % (with pres) injection 400 mg   ropivacaine (PF) 2 mg/mL (0.2%) (NAROPIN) injection 2 mL   sodium chloride flush (NS) 0.9 % injection 2 mL   dexamethasone (DECADRON) injection 10 mg   methylPREDNISolone acetate (DEPO-MEDROL) injection 40 mg   ropivacaine (PF) 2 mg/mL (0.2%) (NAROPIN) injection 4 mL   traMADol (ULTRAM) 50 MG tablet    Sig: Take 1 tablet (50 mg total) by mouth every 8 (eight) hours as needed.    Dispense:  90 tablet    Refill:  1   Medications administered: We administered iohexol, lidocaine, ropivacaine (PF) 2 mg/mL (0.2%), sodium chloride  flush, dexamethasone, methylPREDNISolone acetate, and ropivacaine (PF) 2 mg/mL (0.2%).  See the medical record for exact dosing, route, and time of administration.  Follow-up plan:   Return in about 4 weeks (around 02/11/2023) for Post Procedure Evaluation, in person.       Recent Visits Date Type Provider Dept  12/20/22 Office Visit Edward Jolly, MD [facility]  11/21/22 Office Visit Edward Jolly, MD [facility]  Showing recent visits within past 90 days and meeting all other requirements Today's Visits Date Type Provider Dept  01/14/23 Procedure visit Edward Jolly, MD [facility]  Showing today's visits and meeting all other requirements Future Appointments Date Type Provider Dept  02/11/23 Appointment Edward Jolly, MD [facility]  Showing future appointments within next 90 days and meeting all other requirements  Disposition: Discharge home  Discharge (Date  Time): 01/14/2023; 1353 hrs.   Primary Care Physician: Donita Brooks, MD Location: [facility] Note by: Edward Jolly, MD (TTS technology used. I apologize for any typographical errors that were not detected and corrected.) Date: 01/14/2023; Time: 2:14 PM  Disclaimer:  Medicine is not an Visual merchandiser. The only guarantee in medicine is that nothing is guaranteed. It is important to note that the decision to proceed with this intervention was based on the information collected from the patient. The Data and conclusions were drawn from the patient's questionnaire, the interview, and the physical examination. Because the information was provided in large part by the patient, it cannot be guaranteed that it has not been purposely or unconsciously manipulated. Every effort has been made to obtain as much relevant data as possible for this evaluation. It is important to note that the conclusions that lead to this procedure are derived in large part from the  available  data. Always take into account that the treatment will also be dependent on availability of resources and existing treatment guidelines, considered by other Pain Management Practitioners as being common knowledge and practice, at the time of the intervention. For Medico-Legal purposes, it is also important to point out that variation in procedural techniques and pharmacological choices are the acceptable norm. The indications, contraindications, technique, and results of the above procedure should only be interpreted and judged by a Board-Certified Interventional Pain Specialist with extensive familiarity and expertise in the same exact procedure and technique.

## 2023-01-14 NOTE — Patient Instructions (Signed)

## 2023-01-14 NOTE — Progress Notes (Signed)
PROVIDER NOTE: Interpretation of information contained herein should be left to medically-trained personnel. Specific patient instructions are provided elsewhere under "Patient Instructions" section of medical record. This document was created in part using STT-dictation technology, any transcriptional errors that may result from this process are unintentional.  Patient: Erica Walters Type: Established DOB: 11/02/1965 MRN: [id number] PCP: Donita Brooks, MD  Service: Procedure DOS: 01/14/2023 Setting: Ambulatory Location: Ambulatory outpatient facility Delivery: Face-to-face Provider: Edward Jolly, MD Specialty: Interventional Pain Management Specialty designation: 09 Location: Outpatient facility Ref. Prov.: Edward Jolly, MD       Interventional Therapy   Procedure: Lumbar epidural steroid injection (LESI) (interlaminar) #1    Laterality: Left   Level:  L1-2 Level.  Imaging: Fluoroscopic guidance         Anesthesia: Local anesthesia (1-2% Lidocaine) DOS: 01/14/2023  Performed by: Edward Jolly, MD  Purpose: Diagnostic/Therapeutic Indications: Lumbar radicular pain of intraspinal etiology of more than 4 weeks that has failed to respond to conservative therapy and is severe enough to impact quality of life or function. Lumbar radicular pain  NAS-11 Pain score:   Pre-procedure: 3 /10   Post-procedure: 0-No pain/10      Position / Prep / Materials:  Position: Prone w/ head of the table raised (slight reverse trendelenburg) to facilitate breathing.  Prep solution: DuraPrep (Iodine Povacrylex [0.7% available iodine] and Isopropyl Alcohol, 74% w/w) Prep Area: Entire Posterior Lumbar Region from lower scapular tip down to mid buttocks area and from flank to flank. Materials:  Tray: Epidural tray Needle(s):  Type: Epidural needle (Tuohy) Gauge (G):  22 Length: Regular (3.5-in) Qty: 1   H&P (Pre-op Assessment):  Ms. Sawa is a 57 y.o. (year old), female patient, seen today  for interventional treatment. She  has a past surgical history that includes Total abdominal hysterectomy w/ bilateral salpingoophorectomy and Colonoscopy (12/04/2017). Ms. Poynor has a current medication list which includes the following prescription(s): celecoxib, cyclobenzaprine, ibuprofen, nortriptyline, pregabalin, and tramadol. Her primarily concern today is the Back Pain (lower)  Initial Vital Signs:  Pulse/HCG Rate: 98ECG Heart Rate: 100 Temp: (!) 97.4 F (36.3 C) Resp: 18 BP: (!) 124/108 SpO2: 100 %  BMI: Estimated body mass index is 32.28 kg/m as calculated from the following:   Height as of this encounter: 5\' 6"  (1.676 m).   Weight as of this encounter: 200 lb (90.7 kg).  Risk Assessment: Allergies: Reviewed. She has No Known Allergies.  Allergy Precautions: None required Coagulopathies: Reviewed. None identified.  Blood-thinner therapy: None at this time Active Infection(s): Reviewed. None identified. Ms. Crosley is afebrile  Site Confirmation: Ms. Menton was asked to confirm the procedure and laterality before marking the site Procedure checklist: Completed Consent: Before the procedure and under the influence of no sedative(s), amnesic(s), or anxiolytics, the patient was informed of the treatment options, risks and possible complications. To fulfill our ethical and legal obligations, as recommended by the American Medical Association's Code of Ethics, I have informed the patient of my clinical impression; the nature and purpose of the treatment or procedure; the risks, benefits, and possible complications of the intervention; the alternatives, including doing nothing; the risk(s) and benefit(s) of the alternative treatment(s) or procedure(s); and the risk(s) and benefit(s) of doing nothing. The patient was provided information about the general risks and possible complications associated with the procedure. These may include, but are not limited to: failure to achieve  desired goals, infection, bleeding, organ or nerve damage, allergic reactions, paralysis, and death. In addition, the patient was  informed of those risks and complications associated to Spine-related procedures, such as failure to decrease pain; infection (i.e.: Meningitis, epidural or intraspinal abscess); bleeding (i.e.: epidural hematoma, subarachnoid hemorrhage, or any other type of intraspinal or peri-dural bleeding); organ or nerve damage (i.e.: Any type of peripheral nerve, nerve root, or spinal cord injury) with subsequent damage to sensory, motor, and/or autonomic systems, resulting in permanent pain, numbness, and/or weakness of one or several areas of the body; allergic reactions; (i.e.: anaphylactic reaction); and/or death. Furthermore, the patient was informed of those risks and complications associated with the medications. These include, but are not limited to: allergic reactions (i.e.: anaphylactic or anaphylactoid reaction(s)); adrenal axis suppression; blood sugar elevation that in diabetics may result in ketoacidosis or comma; water retention that in patients with history of congestive heart failure may result in shortness of breath, pulmonary edema, and decompensation with resultant heart failure; weight gain; swelling or edema; medication-induced neural toxicity; particulate matter embolism and blood vessel occlusion with resultant organ, and/or nervous system infarction; and/or aseptic necrosis of one or more joints. Finally, the patient was informed that Medicine is not an exact science; therefore, there is also the possibility of unforeseen or unpredictable risks and/or possible complications that may result in a catastrophic outcome. The patient indicated having understood very clearly. We have given the patient no guarantees and we have made no promises. Enough time was given to the patient to ask questions, all of which were answered to the patient's satisfaction. Ms. Scrogham has  indicated that she wanted to continue with the procedure. Attestation: I, the ordering provider, attest that I have discussed with the patient the benefits, risks, side-effects, alternatives, likelihood of achieving goals, and potential problems during recovery for the procedure that I have provided informed consent. Date  Time: 01/14/2023 12:45 PM   Pre-Procedure Preparation:  Monitoring: As per clinic protocol. Respiration, ETCO2, SpO2, BP, heart rate and rhythm monitor placed and checked for adequate function Safety Precautions: Patient was assessed for positional comfort and pressure points before starting the procedure. Time-out: I initiated and conducted the "Time-out" before starting the procedure, as per protocol. The patient was asked to participate by confirming the accuracy of the "Time Out" information. Verification of the correct person, site, and procedure were performed and confirmed by me, the nursing staff, and the patient. "Time-out" conducted as per Joint Commission's Universal Protocol (UP.01.01.01). Time: 1337 Start Time: 1337 hrs.  Description/Narrative of Procedure:          Target: Epidural space via interlaminar opening, initially targeting the lower laminar border of the superior vertebral body. Region: Lumbar Approach: Percutaneous paravertebral  Rationale (medical necessity): procedure needed and proper for the diagnosis and/or treatment of the patient's medical symptoms and needs. Procedural Technique Safety Precautions: Aspiration looking for blood return was conducted prior to all injections. At no point did we inject any substances, as a needle was being advanced. No attempts were made at seeking any paresthesias. Safe injection practices and needle disposal techniques used. Medications properly checked for expiration dates. SDV (single dose vial) medications used. Description of the Procedure: Protocol guidelines were followed. The procedure needle was introduced  through the skin, ipsilateral to the reported pain, and advanced to the target area. Bone was contacted and the needle walked caudad, until the lamina was cleared. The epidural space was identified using "loss-of-resistance technique" with 2-3 ml of PF-NaCl (0.9% NSS), in a 5cc LOR glass syringe.  6 cc solution made of 3 cc of preservative-free saline, 2  cc of 0.2% ropivacaine, 1 cc of Decadron 10 mg/cc.   Vitals:   01/14/23 1333 01/14/23 1338 01/14/23 1343 01/14/23 1345  BP: (!) 139/102 (!) 142/107 (!) 133/92 132/89  Pulse:      Resp: (!) 22 20 20 16   Temp:      TempSrc:      SpO2: 94% 94% 95% 95%  Weight:      Height:        Start Time: 1337 hrs. End Time: 1344 hrs.  Imaging Guidance (Spinal):          Type of Imaging Technique: Fluoroscopy Guidance (Spinal) Indication(s): Assistance in needle guidance and placement for procedures requiring needle placement in or near specific anatomical locations not easily accessible without such assistance. Exposure Time: Please see nurses notes. Contrast: Before injecting any contrast, we confirmed that the patient did not have an allergy to iodine, shellfish, or radiological contrast. Once satisfactory needle placement was completed at the desired level, radiological contrast was injected. Contrast injected under live fluoroscopy. No contrast complications. See chart for type and volume of contrast used. Fluoroscopic Guidance: I was personally present during the use of fluoroscopy. "Tunnel Vision Technique" used to obtain the best possible view of the target area. Parallax error corrected before commencing the procedure. "Direction-depth-direction" technique used to introduce the needle under continuous pulsed fluoroscopy. Once target was reached, antero-posterior, oblique, and lateral fluoroscopic projection used confirm needle placement in all planes. Images permanently stored in EMR. Interpretation: I personally interpreted the imaging  intraoperatively. Adequate needle placement confirmed in multiple planes. Appropriate spread of contrast into desired area was observed. No evidence of afferent or efferent intravascular uptake. No intrathecal or subarachnoid spread observed. Permanent images saved into the patient's record.  Antibiotic Prophylaxis:   Anti-infectives (From admission, onward)    None      Indication(s): None identified  Post-operative Assessment:  Post-procedure Vital Signs:  Pulse/HCG Rate: 9898 Temp: (!) 97.4 F (36.3 C) Resp: 16 BP: 132/89 SpO2: 95 %  EBL: None  Complications: No immediate post-treatment complications observed by team, or reported by patient.  Note: The patient tolerated the entire procedure well. A repeat set of vitals were taken after the procedure and the patient was kept under observation following institutional policy, for this type of procedure. Post-procedural neurological assessment was performed, showing return to baseline, prior to discharge. The patient was provided with post-procedure discharge instructions, including a section on how to identify potential problems. Should any problems arise concerning this procedure, the patient was given instructions to immediately contact us, at any time, without hesitation. In any case, we plan to contact the patient by telephone for a follow-up status report regarding this interventional procedure.  Comments:  No additional relevant information.  Plan of Care (POC)  Orders:  Orders Placed This Encounter  Procedures   DG PAIN CLINIC C-ARM 1-60 MIN NO REPORT    Intraoperative interpretation by procedural physician at [facility].    Standing Status:   Standing    Number of Occurrences:   1    Order Specific Question:   Reason for exam:    Answer:   Assistance in needle guidance and placement for procedures requiring needle placement in or near specific anatomical locations not easily accessible without such  assistance.    Medications ordered for procedure: PMP checked, refill of Tramadol as below Meds ordered this encounter  Medications   iohexol (OMNIPAQUE) 180 MG/ML injection 10 mL    Must be Myelogram-compatible. If  not available, you may substitute with a water-soluble, non-ionic, hypoallergenic, myelogram-compatible radiological contrast medium.   lidocaine (XYLOCAINE) 2 % (with pres) injection 400 mg   ropivacaine (PF) 2 mg/mL (0.2%) (NAROPIN) injection 2 mL   sodium chloride flush (NS) 0.9 % injection 2 mL   dexamethasone (DECADRON) injection 10 mg   methylPREDNISolone acetate (DEPO-MEDROL) injection 40 mg   ropivacaine (PF) 2 mg/mL (0.2%) (NAROPIN) injection 4 mL   traMADol (ULTRAM) 50 MG tablet    Sig: Take 1 tablet (50 mg total) by mouth every 8 (eight) hours as needed.    Dispense:  90 tablet    Refill:  1   Medications administered: We administered iohexol, lidocaine, ropivacaine (PF) 2 mg/mL (0.2%), sodium chloride flush, dexamethasone, methylPREDNISolone acetate, and ropivacaine (PF) 2 mg/mL (0.2%).  See the medical record for exact dosing, route, and time of administration.  Follow-up plan:   Return in about 4 weeks (around 02/11/2023) for Post Procedure Evaluation, in person.       Recent Visits Date Type Provider Dept  12/20/22 Office Visit Edward Jolly, MD [facility]  11/21/22 Office Visit Edward Jolly, MD [facility]  Showing recent visits within past 90 days and meeting all other requirements Today's Visits Date Type Provider Dept  01/14/23 Procedure visit Edward Jolly, MD [facility]  Showing today's visits and meeting all other requirements Future Appointments Date Type Provider Dept  02/11/23 Appointment Edward Jolly, MD [facility]  Showing future appointments within next 90 days and meeting all other requirements  Disposition: Discharge home  Discharge (Date  Time): 01/14/2023; 1353 hrs.   Primary Care  Physician: Donita Brooks, MD Location: [facility] Note by: Edward Jolly, MD (TTS technology used. I apologize for any typographical errors that were not detected and corrected.) Date: 01/14/2023; Time: 2:14 PM  Disclaimer:  Medicine is not an Visual merchandiser. The only guarantee in medicine is that nothing is guaranteed. It is important to note that the decision to proceed with this intervention was based on the information collected from the patient. The Data and conclusions were drawn from the patient's questionnaire, the interview, and the physical examination. Because the information was provided in large part by the patient, it cannot be guaranteed that it has not been purposely or unconsciously manipulated. Every effort has been made to obtain as much relevant data as possible for this evaluation. It is important to note that the conclusions that lead to this procedure are derived in large part from the available data. Always take into account that the treatment will also be dependent on availability of resources and existing treatment guidelines, considered by other Pain Management Practitioners as being common knowledge and practice, at the time of the intervention. For Medico-Legal purposes, it is also important to point out that variation in procedural techniques and pharmacological choices are the acceptable norm. The indications, contraindications, technique, and results of the above procedure should only be interpreted and judged by a Board-Certified Interventional Pain Specialist with extensive familiarity and expertise in the same exact procedure and technique.

## 2023-01-15 ENCOUNTER — Telehealth: Payer: Self-pay

## 2023-01-15 NOTE — Telephone Encounter (Signed)
Post rpocedure follow up.  Patient states she is doing good.

## 2023-02-08 NOTE — Progress Notes (Signed)
treated

## 2023-02-11 ENCOUNTER — Encounter: Payer: Self-pay | Admitting: Student in an Organized Health Care Education/Training Program

## 2023-02-11 ENCOUNTER — Ambulatory Visit
Payer: 59 | Attending: Student in an Organized Health Care Education/Training Program | Admitting: Student in an Organized Health Care Education/Training Program

## 2023-02-11 VITALS — BP 133/87 | HR 101 | Temp 97.2°F | Resp 16 | Ht 66.0 in | Wt 200.0 lb

## 2023-02-11 DIAGNOSIS — M5416 Radiculopathy, lumbar region: Secondary | ICD-10-CM | POA: Insufficient documentation

## 2023-02-11 DIAGNOSIS — G8929 Other chronic pain: Secondary | ICD-10-CM | POA: Diagnosis present

## 2023-02-11 DIAGNOSIS — M5116 Intervertebral disc disorders with radiculopathy, lumbar region: Secondary | ICD-10-CM | POA: Insufficient documentation

## 2023-02-11 DIAGNOSIS — G894 Chronic pain syndrome: Secondary | ICD-10-CM | POA: Diagnosis present

## 2023-02-11 MED ORDER — CYCLOBENZAPRINE HCL 5 MG PO TABS
5.0000 mg | ORAL_TABLET | Freq: Three times a day (TID) | ORAL | 2 refills | Status: DC | PRN
Start: 2023-02-11 — End: 2024-01-30

## 2023-02-11 NOTE — Progress Notes (Signed)
Safety precautions to be maintained throughout the outpatient stay will include: orient to surroundings, keep bed in low position, maintain call bell within reach at all times, provide assistance with transfer out of bed and ambulation.  

## 2023-02-11 NOTE — Progress Notes (Signed)
PROVIDER NOTE: Information contained herein reflects review and annotations entered in association with encounter. Interpretation of such information and data should be left to medically-trained personnel. Information provided to patient can be located elsewhere in the medical record under "Patient Instructions". Document created using STT-dictation technology, any transcriptional errors that may result from process are unintentional.    Patient: Erica Walters  Service Category: E/M  Provider: Edward Jolly, MD  DOB: [birth year]  DOS: 02/11/2023  Referring Provider: Donita Brooks, MD  MRN: [id number]  Specialty: Interventional Pain Management  PCP: Donita Brooks, MD  Type: Established Patient  Setting: Ambulatory outpatient    Location: Office  Delivery: Face-to-face     HPI  Erica Walters, a 57 y.o. year old female, is here today because of her Lumbar disc herniation with radiculopathy [M51.16]. Erica Walters primary complain today is Back Pain (Lumbar bilateral )  Pertinent problems: Erica Walters has Cervical radiculopathy; DDD (degenerative disc disease), cervical; and Bilateral plantar fasciitis on their pertinent problem list. Pain Assessment: Severity of Chronic pain is reported as a 5 /10. Location: Back Lower, Left, Right/left groin, and into hips as well as buttocks. patient feels like sometimes if affects her knee.  she also reports a burning sensation on the top of the right foot.. Onset: More than a month ago. Quality: Tender, Sore, Discomfort, Constant, Sharp, Aching, Burning. Timing: Constant. Modifying factor(s): nothing currently. Vitals:  height is 5\' 6"  (1.676 m) and weight is 200 lb (90.7 kg). Her temporal temperature is 97.2 F (36.2 C) (abnormal). Her blood pressure is 133/87 and her pulse is 101 (abnormal). Her respiration is 16 and oxygen saturation is 97%.  BMI: Estimated body mass index is 32.28 kg/m as calculated from the following:   Height as of this encounter:  5\' 6"  (1.676 m).   Weight as of this encounter: 200 lb (90.7 kg). Last encounter: 12/20/2022. Last procedure: 01/14/2023.  Reason for encounter:   Zhaniyah presents today for postprocedural follow-up.  She did not get any significant benefit after a diagnostic left SI joint and piriformis injection.  She continues to endorse low back pain with radiation into bilateral buttocks and hips.  MRI at L3-L4 shows disc desiccation with right foraminal to extraforaminal disc protrusion which could be affecting the right L3 nerve root.  She is status post a lumbar epidural steroid injection that did provide benefit.  This was done on 02/12/2022.  Given that has almost been a year since her lumbar epidural steroid injection, and with symptoms consistent with radicular pain flare, discussed repeating lumbar epidural steroid injection.  Risks and benefits reviewed and patient would like to proceed.  Refill of Flexeril as below  ROS  Constitutional: Denies any fever or chills Gastrointestinal: No reported hemesis, hematochezia, vomiting, or acute GI distress Musculoskeletal:  Radiating hip and buttock pain Neurological: No reported episodes of acute onset apraxia, aphasia, dysarthria, agnosia, amnesia, paralysis, loss of coordination, or loss of consciousness  Medication Review  celecoxib, cyclobenzaprine, ibuprofen, nortriptyline, pregabalin, and traMADol  History Review  Allergy: Erica Walters has No Known Allergies. Drug: Erica Walters  reports no history of drug use. Alcohol:  reports current alcohol use of about 14.0 standard drinks of alcohol per week. Tobacco:  reports that she has been smoking cigarettes. She has a 13 pack-year smoking history. She has never used smokeless tobacco. Social: Erica Walters  reports that she has been smoking cigarettes. She has a 13 pack-year smoking history. She has never used smokeless tobacco. She  reports current alcohol use of about 14.0 standard drinks of alcohol per week.  She reports that she does not use drugs. Medical:  has a past medical history of Allergy, Arthritis, and Hyperlipidemia. Surgical: Erica Walters  has a past surgical history that includes Total abdominal hysterectomy w/ bilateral salpingoophorectomy and Colonoscopy (12/04/2017). Family: family history includes Colon cancer in her paternal grandfather.  Laboratory Chemistry Profile   Renal Lab Results  Component Value Date   BUN 11 01/23/2019   CREATININE 0.81 01/23/2019   BCR NOT APPLICABLE 01/23/2019   GFRAA 97 01/23/2019   GFRNONAA 84 01/23/2019    Hepatic Lab Results  Component Value Date   AST 44 (H) 01/23/2019   ALT 62 (H) 01/23/2019   ALBUMIN 4.1 07/19/2015   ALKPHOS 83 07/19/2015   LIPASE 30 07/19/2015    Electrolytes Lab Results  Component Value Date   NA 141 01/23/2019   K 4.8 01/23/2019   CL 106 01/23/2019   CALCIUM 9.7 01/23/2019    Bone Lab Results  Component Value Date   VD25OH 31 01/23/2019    Inflammation (CRP: Acute Phase) (ESR: Chronic Phase) Lab Results  Component Value Date   CRP 4.7 12/25/2018   ESRSEDRATE 14 12/25/2018         Note: Above Lab results reviewed.  MR LUMBAR SPINE WO CONTRAST CLINICAL DATA:  Initial evaluation for low back pain with left-sided radicular symptoms.   EXAM: MRI LUMBAR SPINE WITHOUT CONTRAST   TECHNIQUE: Multiplanar, multisequence MR imaging of the lumbar spine was performed. No intravenous contrast was administered.   COMPARISON:  Prior MRI from 04/11/2019.   FINDINGS: Segmentation: Standard. Lowest well-formed disc space labeled the L5-S1 level.   Alignment: Trace degenerative anterolisthesis of L5 on S1. Alignment otherwise normal with preservation of the normal lumbar lordosis.   Vertebrae: Vertebral body height maintained without acute or chronic fracture. Bone marrow signal intensity diffusely heterogeneous with multiple scattered benign hemangiomata. No worrisome osseous lesions. Mild reactive  edema present about the left L5-S1 facet due to facet arthritis. No other abnormal marrow edema.   Conus medullaris and cauda equina: Conus extends to the T12-L1 level. Conus and cauda equina appear normal.   Paraspinal and other soft tissues: Paraspinous soft tissues within normal limits. Subcentimeter simple right renal cyst noted, benign in appearance, no follow-up imaging recommended. Visualized visceral structures otherwise unremarkable.   Disc levels:   T11-12: Seen only on sagittal projection. Disc bulge with mild disc desiccation. No stenosis.   T12-L1: Mild disc bulge with disc desiccation.  No stenosis.   L1-2: Diffuse disc bulge with disc desiccation. Superimposed tiny left subarticular disc extrusion with inferior migration (series 6, images 9, 10, 11). The descending left L2 nerve root could potentially be affected. No significant canal or lateral recess stenosis. Foramina remain patent.   L2-3: Disc desiccation with mild annular disc bulge. Mild facet hypertrophy. No stenosis.   L3-4: Disc desiccation with mild diffuse disc bulge. Reactive endplate spurring. Superimposed broad right foraminal to extraforaminal disc protrusion (series 7, image 22), potentially affecting the exiting right L3 nerve root. Mild bilateral facet hypertrophy. Resultant mild narrowing of the lateral recesses bilaterally. Central canal remains patent. Mild bilateral L3 foraminal stenosis.   L4-5: Disc desiccation with mild disc bulge. Mild to moderate facet hypertrophy. No significant spinal stenosis. Foramina remain patent.   L5-S1: Degenerative intervertebral disc space narrowing with disc desiccation and diffuse disc bulge. Mild reactive endplate spurring. Moderate left worse than right facet arthrosis. No significant  spinal stenosis. Mild left L5 foraminal narrowing. Right neural foramen remains patent.   IMPRESSION: 1. Tiny left subarticular disc extrusion with inferior migration  at L1-2, potentially affecting the descending left L2 nerve root. 2. Broad right foraminal to extraforaminal disc protrusion at L3-4, potentially affecting the exiting right L3 nerve root. 3. Mild to moderate bilateral facet hypertrophy at L2-3 through L5-S1, most pronounced at L5-S1 on the left. Finding could serve as a source for lower back pain. 4. Additional mild noncompressive disc bulging elsewhere throughout the lumbar spine as above. No other significant stenosis or neural impingement.   Electronically Signed   By: Rise Mu M.D.   On: 01/21/2022 01:43 Note: Reviewed        Physical Exam  General appearance: Well nourished, well developed, and well hydrated. In no apparent acute distress Mental status: Alert, oriented x 3 (person, place, & time)       Respiratory: No evidence of acute respiratory distress Eyes: PERLA Vitals: BP 133/87 (BP Location: Left Arm, Patient Position: Sitting, Cuff Size: Large)   Pulse (!) 101   Temp (!) 97.2 F (36.2 C) (Temporal)   Resp 16   Ht 5\' 6"  (1.676 m)   Wt 200 lb (90.7 kg)   SpO2 97%   BMI 32.28 kg/m  BMI: Estimated body mass index is 32.28 kg/m as calculated from the following:   Height as of this encounter: 5\' 6"  (1.676 m).   Weight as of this encounter: 200 lb (90.7 kg). Ideal: Ideal body weight: 59.3 kg (130 lb 11.7 oz) Adjusted ideal body weight: 71.9 kg (158 lb 7 oz)  Lumbar Spine Area Exam  Skin & Axial Inspection: No masses, redness, or swelling Alignment: Symmetrical Functional ROM: Pain restricted ROM       Stability: No instability detected Muscle Tone/Strength: Functionally intact. No obvious neuro-muscular anomalies detected. Sensory (Neurological): Dermatomal pain pattern  Palpation: No palpable anomalies        Gait & Posture Assessment  Ambulation: Patient ambulates using a cane Gait: Antalgic gait (limping) Posture: Difficulty standing up straight, due to pain  Lower Extremity Exam      Side:  Right lower extremity   Side: Left lower extremity  Stability: No instability observed           Stability: No instability observed          Skin & Extremity Inspection: Skin color, temperature, and hair growth are WNL. No peripheral edema or cyanosis. No masses, redness, swelling, asymmetry, or associated skin lesions. No contractures.   Skin & Extremity Inspection: Skin color, temperature, and hair growth are WNL. No peripheral edema or cyanosis. No masses, redness, swelling, asymmetry, or associated skin lesions. No contractures.  Functional ROM: Unrestricted ROM                   Functional ROM: Pain restricted ROM for all joints of the lower extremity          Muscle Tone/Strength: Functionally intact. No obvious neuro-muscular anomalies detected.   Muscle Tone/Strength: Functionally intact. No obvious neuro-muscular anomalies detected.  Sensory (Neurological): Unimpaired         Sensory (Neurological): Dermatomal pain pattern        DTR: Patellar: deferred today Achilles: deferred today Plantar: deferred today   DTR: Patellar: deferred today Achilles: deferred today Plantar: deferred today  Palpation: No palpable anomalies   Palpation: No palpable anomalies     Assessment   Diagnosis Status  1. Lumbar disc herniation  with radiculopathy   2. Lumbar radicular pain   3. Chronic radicular lumbar pain   4. Chronic pain syndrome   5. Chronic radicular lumbar pain (left)    Having a Flare-up Having a Flare-up Having a Flare-up    Plan of Care    Erica Walters has a current medication list which includes the following long-term medication(s): nortriptyline and pregabalin.  Pharmacotherapy (Medications Ordered): Meds ordered this encounter  Medications   cyclobenzaprine (FLEXERIL) 5 MG tablet    Sig: Take 1 tablet (5 mg total) by mouth 3 (three) times daily as needed for muscle spasms.    Dispense:  90 tablet    Refill:  2    Do not place this medication, or any other  prescription from our practice, on "Automatic Refill". Patient may have prescription filled one day early if pharmacy is closed on scheduled refill date.   Orders:  Orders Placed This Encounter  Procedures   Lumbar Epidural Injection    Standing Status:   Future    Standing Expiration Date:   05/14/2023    Scheduling Instructions:     Procedure: Interlaminar Lumbar Epidural Steroid injection (LESI)            Laterality: Midline     Sedation: without     Timeframe: ASAA    Order Specific Question:   Where will this procedure be performed?    Answer:   [facility]   Follow-up plan:   Return in about 5 weeks (around 03/20/2023) for Lumbar ESI, in clinic [facility].      Recent Visits Date Type Provider Dept  01/14/23 Procedure visit Edward Jolly, MD [facility]  12/20/22 Office Visit Edward Jolly, MD [facility]  11/21/22 Office Visit Edward Jolly, MD [facility]  Showing recent visits within past 90 days and meeting all other requirements Today's Visits Date Type Provider Dept  02/11/23 Office Visit Edward Jolly, MD [facility]  Showing today's visits and meeting all other requirements Future Appointments No visits were found meeting these conditions. Showing future appointments within next 90 days and meeting all other requirements  I discussed the assessment and treatment plan with the patient. The patient was provided an opportunity to ask questions and all were answered. The patient agreed with the plan and demonstrated an understanding of the instructions.  Patient advised to call back or seek an in-person evaluation if the symptoms or condition worsens.  Duration of encounter: .  Total time on encounter, as per AMA guidelines included both the face-to-face and non-face-to-face time personally spent by the physician and/or other qualified health care professional(s) on the day of the encounter (includes time in  activities that require the physician or other qualified health care professional and does not include time in activities normally performed by clinical staff). Physician's time may include the following activities when performed: Preparing to see the patient (e.g., pre-charting review of records, searching for previously ordered imaging, lab work, and nerve conduction tests) Review of prior analgesic pharmacotherapies. Reviewing PMP Interpreting ordered tests (e.g., lab work, imaging, nerve conduction tests) Performing post-procedure evaluations, including interpretation of diagnostic procedures Obtaining and/or reviewing separately obtained history Performing a medically appropriate examination and/or evaluation Counseling and educating the patient/family/caregiver Ordering medications, tests, or procedures Referring and communicating with other health care professionals (when not separately reported) Documenting clinical information in the electronic or other health record Independently interpreting results (not separately reported) and communicating results to the patient/ family/caregiver  Care coordination (not separately reported)  Note by: Edward Jolly, MD Date: 02/11/2023; Time: 3:21 PM

## 2023-02-11 NOTE — Patient Instructions (Addendum)
No lifting beyond 20 lbs Limit bending, lifting, and twisting I recommend Pahal have the ability to rest in between strenuous tasks if she is experiencing increased pain  Edward Jolly, MD

## 2023-02-15 ENCOUNTER — Other Ambulatory Visit: Payer: 59

## 2023-02-15 DIAGNOSIS — E782 Mixed hyperlipidemia: Secondary | ICD-10-CM

## 2023-02-15 DIAGNOSIS — G5793 Unspecified mononeuropathy of bilateral lower limbs: Secondary | ICD-10-CM

## 2023-02-16 LAB — COMPLETE METABOLIC PANEL WITH GFR
AG Ratio: 1.6 (calc) (ref 1.0–2.5)
ALT: 44 U/L — ABNORMAL HIGH (ref 6–29)
AST: 32 U/L (ref 10–35)
Albumin: 3.9 g/dL (ref 3.6–5.1)
Alkaline phosphatase (APISO): 121 U/L (ref 37–153)
BUN: 17 mg/dL (ref 7–25)
CO2: 26 mmol/L (ref 20–32)
Calcium: 9.3 mg/dL (ref 8.6–10.4)
Chloride: 105 mmol/L (ref 98–110)
Creat: 0.66 mg/dL (ref 0.50–1.03)
Globulin: 2.5 g/dL (ref 1.9–3.7)
Glucose, Bld: 108 mg/dL — ABNORMAL HIGH (ref 65–99)
Potassium: 4.5 mmol/L (ref 3.5–5.3)
Sodium: 141 mmol/L (ref 135–146)
Total Bilirubin: 0.2 mg/dL (ref 0.2–1.2)
Total Protein: 6.4 g/dL (ref 6.1–8.1)
eGFR: 103 mL/min/{1.73_m2} (ref >=60)

## 2023-02-16 LAB — CBC WITH DIFFERENTIAL/PLATELET
Absolute Monocytes: 725 {cells}/uL (ref 200–950)
Basophils Absolute: 39 {cells}/uL (ref 0–200)
Basophils Relative: 0.5 %
Eosinophils Absolute: 148 cells/uL (ref 15–500)
Eosinophils Relative: 1.9 %
HCT: 43.4 % (ref 35.0–45.0)
Hemoglobin: 14.4 g/dL (ref 11.7–15.5)
Lymphs Abs: 2324 {cells}/uL (ref 850–3900)
MCH: 31.8 pg (ref 27.0–33.0)
MCHC: 33.2 g/dL (ref 32.0–36.0)
MCV: 95.8 fL (ref 80.0–100.0)
MPV: 9.7 fL (ref 7.5–12.5)
Monocytes Relative: 9.3 %
Neutro Abs: 4563 {cells}/uL (ref 1500–7800)
Neutrophils Relative %: 58.5 %
Platelets: 251 10*3/uL (ref 140–400)
RBC: 4.53 10*6/uL (ref 3.80–5.10)
RDW: 12.8 % (ref 11.0–15.0)
Total Lymphocyte: 29.8 %
WBC: 7.8 10*3/uL (ref 3.8–10.8)

## 2023-02-16 LAB — LIPID PANEL
Cholesterol: 225 mg/dL — ABNORMAL HIGH (ref <200)
HDL: 39 mg/dL — ABNORMAL LOW (ref >=50)
LDL Cholesterol (Calc): 145 mg/dL — ABNORMAL HIGH
Non-HDL Cholesterol (Calc): 186 mg/dL — ABNORMAL HIGH (ref <130)
Total CHOL/HDL Ratio: 5.8 (calc) — ABNORMAL HIGH (ref <5.0)
Triglycerides: 269 mg/dL — ABNORMAL HIGH (ref <150)

## 2023-02-18 ENCOUNTER — Other Ambulatory Visit: Payer: 59

## 2023-02-22 ENCOUNTER — Ambulatory Visit: Payer: 59 | Admitting: Family Medicine

## 2023-02-22 ENCOUNTER — Telehealth: Payer: Self-pay | Admitting: Family Medicine

## 2023-02-22 ENCOUNTER — Other Ambulatory Visit: Payer: Self-pay | Admitting: Family Medicine

## 2023-02-22 VITALS — BP 150/92 | HR 99 | Temp 98.1°F | Ht 66.0 in | Wt 218.2 lb

## 2023-02-22 DIAGNOSIS — F17200 Nicotine dependence, unspecified, uncomplicated: Secondary | ICD-10-CM

## 2023-02-22 DIAGNOSIS — Z0000 Encounter for general adult medical examination without abnormal findings: Secondary | ICD-10-CM

## 2023-02-22 DIAGNOSIS — E669 Obesity, unspecified: Secondary | ICD-10-CM | POA: Diagnosis not present

## 2023-02-22 DIAGNOSIS — R739 Hyperglycemia, unspecified: Secondary | ICD-10-CM | POA: Diagnosis not present

## 2023-02-22 DIAGNOSIS — Z0001 Encounter for general adult medical examination with abnormal findings: Secondary | ICD-10-CM | POA: Diagnosis not present

## 2023-02-22 DIAGNOSIS — I10 Essential (primary) hypertension: Secondary | ICD-10-CM

## 2023-02-22 DIAGNOSIS — E7800 Pure hypercholesterolemia, unspecified: Secondary | ICD-10-CM

## 2023-02-22 DIAGNOSIS — Z1211 Encounter for screening for malignant neoplasm of colon: Secondary | ICD-10-CM

## 2023-02-22 MED ORDER — WEGOVY 0.5 MG/0.5ML ~~LOC~~ SOAJ: 0.5000 mg | SUBCUTANEOUS | 1 refills | Status: DC

## 2023-02-22 MED ORDER — LOSARTAN POTASSIUM 50 MG PO TABS: 50.0000 mg | ORAL_TABLET | Freq: Every day | ORAL | 3 refills | Status: AC

## 2023-02-22 NOTE — Telephone Encounter (Signed)
As requested by provider, patient called to advise that her insurance plan covers [facility]. Requesting for script to be sent today if possible to:  [id number], [place] - [address] AT [facility] [address], [place] [address] Phone: [phone]  Fax: [phone] DEA #: [id number]   Please advise patient with questions at [phone].

## 2023-02-22 NOTE — Progress Notes (Signed)
Subjective:    Patient ID: Erica Walters, female    DOB: Mar 04, 1966, 57 y.o.   MRN: [id number]  HPI Patient is a very pleasant 57 year old Caucasian female here today for a physical exam.  Several issues.  Her last colonoscopy was in 2020.  They recommended a repeat colonoscopy in 2 years.  Is now 4 years.  Therefore she is overdue for a colonoscopy.  Her mammogram was done earlier this year and was normal.  She has a history of a hysterectomy and therefore does not require Pap smears.  She is not yet due for a bone density test.  Unfortunately she smokes.  She has a 20-year plus pack year history of smoking.  Therefore she is overdue for a CT scan screening for lung cancer.  She does report hot flashes and heat intolerance.  This started around the time she went through menopause.  She wanted to discuss that.  Her most recent lab work is listed below.  Labs are significant for a mild elevated function test most likely consistent with fatty liver disease, hyperlipidemia, and hyperglycemia borderline prediabetes. Lab on 02/15/2023  Component Date Value Ref Range Status   WBC 02/15/2023 7.8  3.8 - 10.8 Thousand/uL Final   RBC 02/15/2023 4.53  3.80 - 5.10 Million/uL Final   Hemoglobin 02/15/2023 14.4  11.7 - 15.5 g/dL Final   HCT 24/40/1027 43.4  35.0 - 45.0 % Final   MCV 02/15/2023 95.8  80.0 - 100.0 fL Final   MCH 02/15/2023 31.8  27.0 - 33.0 pg Final   MCHC 02/15/2023 33.2  32.0 - 36.0 g/dL Final   RDW 25/36/6440 12.8  11.0 - 15.0 % Final   Platelets 02/15/2023 251  140 - 400 Thousand/uL Final   MPV 02/15/2023 9.7  7.5 - 12.5 fL Final   Neutro Abs 02/15/2023 4,563  1,500 - 7,800 cells/uL Final   Lymphs Abs 02/15/2023 2,324  850 - 3,900 cells/uL Final   Absolute Monocytes 02/15/2023 725  200 - 950 cells/uL Final   Eosinophils Absolute 02/15/2023 148  15 - 500 cells/uL Final   Basophils Absolute 02/15/2023 39  0 - 200 cells/uL Final   Neutrophils Relative % 02/15/2023 58.5  % Final   Total  Lymphocyte 02/15/2023 29.8  % Final   Monocytes Relative 02/15/2023 9.3  % Final   Eosinophils Relative 02/15/2023 1.9  % Final   Basophils Relative 02/15/2023 0.5  % Final   Glucose, Bld 02/15/2023 108 (H)  65 - 99 mg/dL Final   Comment: .            Fasting reference interval . For someone without known diabetes, a glucose value between 100 and 125 mg/dL is consistent with prediabetes and should be confirmed with a follow-up test. .    BUN 02/15/2023 17  7 - 25 mg/dL Final   Creat 34/74/2595 0.66  0.50 - 1.03 mg/dL Final   eGFR 63/87/5643 103  > OR = 60 mL/min/1.89m2 Final   BUN/Creatinine Ratio 02/15/2023 SEE NOTE:  6 - 22 (calc) Final   Comment:    Not Reported: BUN and Creatinine are within    reference range. .    Sodium 02/15/2023 141  135 - 146 mmol/L Final   Potassium 02/15/2023 4.5  3.5 - 5.3 mmol/L Final   Chloride 02/15/2023 105  98 - 110 mmol/L Final   CO2 02/15/2023 26  20 - 32 mmol/L Final   Calcium 02/15/2023 9.3  8.6 - 10.4 mg/dL Final  Total Protein 02/15/2023 6.4  6.1 - 8.1 g/dL Final   Albumin 16/03/9603 3.9  3.6 - 5.1 g/dL Final   Globulin 54/02/8118 2.5  1.9 - 3.7 g/dL (calc) Final   AG Ratio 02/15/2023 1.6  1.0 - 2.5 (calc) Final   Total Bilirubin 02/15/2023 0.2  0.2 - 1.2 mg/dL Final   Alkaline phosphatase (APISO) 02/15/2023 121  37 - 153 U/L Final   AST 02/15/2023 32  10 - 35 U/L Final   ALT 02/15/2023 44 (H)  6 - 29 U/L Final   Cholesterol 02/15/2023 225 (H)  <200 mg/dL Final   HDL 14/78/2956 39 (L)  > OR = 50 mg/dL Final   Triglycerides 21/30/8657 269 (H)  <150 mg/dL Final   Comment: . If a non-fasting specimen was collected, consider repeat triglyceride testing on a fasting specimen if clinically indicated.  Perry Mount et al. J. of Clin. Lipidol. 2015;9:129-169. Marland Kitchen    LDL Cholesterol (Calc) 02/15/2023 145 (H)  mg/dL (calc) Final   Comment: Reference range: <100 . Desirable range <100 mg/dL for primary prevention;   <70 mg/dL for patients with  CHD or diabetic patients  with > or = 2 CHD risk factors. Marland Kitchen LDL-C is now calculated using the Martin-Hopkins  calculation, which is a validated novel method providing  better accuracy than the Friedewald equation in the  estimation of LDL-C.  Horald Pollen et al. Lenox Ahr. 8469;629(52): 2061-2068  (http://education.[facility].com/faq/FAQ164)    Total CHOL/HDL Ratio 02/15/2023 5.8 (H)  <5.0 (calc) Final   Non-HDL Cholesterol (Calc) 02/15/2023 186 (H)  <130 mg/dL (calc) Final   Comment: For patients with diabetes plus 1 major ASCVD risk  factor, treating to a non-HDL-C goal of <100 mg/dL  (LDL-C of <84 mg/dL) is considered a therapeutic  option.      Past Medical History:  Diagnosis Date   Allergy    Arthritis    Hyperlipidemia    Past Surgical History:  Procedure Laterality Date   COLONOSCOPY  12/04/2017   TOTAL ABDOMINAL HYSTERECTOMY W/ BILATERAL SALPINGOOPHORECTOMY     Current Outpatient Medications on File Prior to Visit  Medication Sig Dispense Refill   celecoxib (CELEBREX) 100 MG capsule Take 1 capsule (100 mg total) by mouth 2 (two) times daily. 180 capsule 1   cyclobenzaprine (FLEXERIL) 5 MG tablet Take 1 tablet (5 mg total) by mouth 3 (three) times daily as needed for muscle spasms. 90 tablet 2   nortriptyline (PAMELOR) 50 MG capsule Take 1 capsule (50 mg total) by mouth at bedtime. 30 capsule 5   pregabalin (LYRICA) 100 MG capsule Take 1 capsule (100 mg total) by mouth 2 (two) times daily. 60 capsule 5   traMADol (ULTRAM) 50 MG tablet Take 1 tablet (50 mg total) by mouth every 8 (eight) hours as needed. 90 tablet 1   No current facility-administered medications on file prior to visit.   No Known Allergies Social History   Socioeconomic History   Marital status: Married    Spouse name: Not on file   Number of children: Not on file   Years of education: Not on file   Highest education level: Not on file  Occupational History   Not on file  Tobacco Use    Smoking status: Every Day    Current packs/day: 0.50    Average packs/day: 0.5 packs/day for 26.0 years (13.0 ttl pk-yrs)    Types: Cigarettes   Smokeless tobacco: Never  Vaping Use   Vaping status: Never Used  Substance and Sexual  Activity   Alcohol use: Yes    Alcohol/week: 14.0 standard drinks of alcohol    Types: 14 Glasses of wine per week    Comment: daily wine per pt   Drug use: Never   Sexual activity: Not Currently  Other Topics Concern   Not on file  Social History Narrative   Not on file   Social Determinants of Health   Financial Resource Strain: Not on file  Food Insecurity: Not on file  Transportation Needs: Not on file  Physical Activity: Not on file  Stress: Not on file  Social Connections: Not on file  Intimate Partner Violence: Not on file     Review of Systems  All other systems reviewed and are negative.      Objective:   Physical Exam Constitutional:      General: She is not in acute distress.    Appearance: She is obese. She is not ill-appearing or toxic-appearing.  Cardiovascular:     Rate and Rhythm: Normal rate and regular rhythm.     Heart sounds: Normal heart sounds.  Pulmonary:     Effort: Pulmonary effort is normal.     Breath sounds: Normal breath sounds.  Musculoskeletal:     Right foot: Normal range of motion. No deformity, bunion, Charcot foot or foot drop.     Left foot: Normal range of motion. No deformity, bunion, Charcot foot or foot drop.  Feet:     Right foot:     Skin integrity: Skin integrity normal. No ulcer, blister or skin breakdown.     Left foot:     Skin integrity: Skin integrity normal. No ulcer, blister or skin breakdown.  Neurological:     Mental Status: She is alert.           Assessment & Plan:  Colon cancer screening - Plan: Ambulatory referral to Gastroenterology  Smoker - Plan: CT CHEST LUNG CA SCREEN LOW DOSE W/O CM  Obesity (BMI 30-39.9)  Hyperglycemia  Pure hypercholesterolemia  Benign  essential HTN  General medical exam First regarding her cancer screening.  I will to schedule her for a colonoscopy so I placed a referral to GI.  Second I want to screen for lung cancer given her smoking so I will schedule a CT scan of the lungs to screen for lung cancer.  Mammogram is up-to-date.  Blood pressure is elevated.  Recommend starting losartan 50 mg daily and rechecking blood pressure afterwards.  I suspect that the patient's elevated liver function test are due to mild fatty liver disease.  She also has hyperglycemia and hyperlipidemia.  All of this is related to her weight.  I would strongly recommended trying semaglutide and I gave her contact information to check on to see if she wants to prescribe this.  The patient declines taking a statin at this point for her hyperlipidemia but wants to focus on trying to lose weight.  After being on the medication we will recheck labs work in 6 months.  Strongly recommended smoking cessation.  Also recommended Veozah for hot flashes.

## 2023-02-27 ENCOUNTER — Telehealth: Payer: Self-pay

## 2023-02-27 NOTE — Telephone Encounter (Signed)
PA FOR WEGOVY SENT TO PLAN:  Erica Walters (Key: [id number])  Your information has been sent to [facility].

## 2023-02-28 ENCOUNTER — Encounter: Payer: Self-pay | Admitting: Family Medicine

## 2023-03-11 ENCOUNTER — Other Ambulatory Visit: Payer: 59

## 2023-03-13 ENCOUNTER — Ambulatory Visit
Admission: RE | Admit: 2023-03-13 | Discharge: 2023-03-13 | Disposition: A | Discharge status: Home / Self Care | Payer: 59 | Source: Ambulatory Visit | Attending: Family Medicine | Admitting: Family Medicine

## 2023-03-13 DIAGNOSIS — F17200 Nicotine dependence, unspecified, uncomplicated: Secondary | ICD-10-CM

## 2023-03-20 ENCOUNTER — Ambulatory Visit
Admission: RE | Admit: 2023-03-20 | Discharge: 2023-03-20 | Disposition: A | Discharge status: Home / Self Care | Payer: 59 | Source: Ambulatory Visit | Attending: Student in an Organized Health Care Education/Training Program | Admitting: Student in an Organized Health Care Education/Training Program

## 2023-03-20 ENCOUNTER — Ambulatory Visit: Payer: 59 | Admitting: Student in an Organized Health Care Education/Training Program

## 2023-03-20 ENCOUNTER — Other Ambulatory Visit: Payer: Self-pay | Admitting: Student in an Organized Health Care Education/Training Program

## 2023-03-20 ENCOUNTER — Encounter: Payer: Self-pay | Admitting: Student in an Organized Health Care Education/Training Program

## 2023-03-20 DIAGNOSIS — M5116 Intervertebral disc disorders with radiculopathy, lumbar region: Secondary | ICD-10-CM

## 2023-03-20 DIAGNOSIS — G8929 Other chronic pain: Secondary | ICD-10-CM | POA: Insufficient documentation

## 2023-03-20 DIAGNOSIS — M5416 Radiculopathy, lumbar region: Secondary | ICD-10-CM

## 2023-03-20 DIAGNOSIS — R52 Pain, unspecified: Secondary | ICD-10-CM

## 2023-03-20 DIAGNOSIS — Z79899 Other long term (current) drug therapy: Secondary | ICD-10-CM | POA: Diagnosis not present

## 2023-03-20 MED ORDER — IOHEXOL 180 MG/ML  SOLN
10.0000 mL | Freq: Once | INTRAMUSCULAR | Status: AC
  Administered 2023-03-20: 10 mL via EPIDURAL

## 2023-03-20 MED ORDER — DEXAMETHASONE SODIUM PHOSPHATE 10 MG/ML IJ SOLN
10.0000 mg | Freq: Once | INTRAMUSCULAR | Status: AC
  Administered 2023-03-20: 10 mg

## 2023-03-20 MED ORDER — ROPIVACAINE HCL 2 MG/ML IJ SOLN
INTRAMUSCULAR | Status: AC
  Filled 2023-03-20: qty 20

## 2023-03-20 MED ORDER — DEXAMETHASONE SODIUM PHOSPHATE 10 MG/ML IJ SOLN
INTRAMUSCULAR | Status: AC
  Filled 2023-03-20: qty 1

## 2023-03-20 MED ORDER — ROPIVACAINE HCL 2 MG/ML IJ SOLN
2.0000 mL | Freq: Once | INTRAMUSCULAR | Status: AC
  Administered 2023-03-20: 2 mL via EPIDURAL

## 2023-03-20 MED ORDER — LIDOCAINE HCL 2 % IJ SOLN
20.0000 mL | Freq: Once | INTRAMUSCULAR | Status: AC
  Administered 2023-03-20: 400 mg

## 2023-03-20 MED ORDER — IOHEXOL 180 MG/ML  SOLN
INTRAMUSCULAR | Status: AC
  Filled 2023-03-20: qty 20

## 2023-03-20 MED ORDER — SODIUM CHLORIDE 0.9% FLUSH
2.0000 mL | Freq: Once | INTRAVENOUS | Status: AC
  Administered 2023-03-20: 2 mL

## 2023-03-20 MED ORDER — LIDOCAINE HCL 2 % IJ SOLN
INTRAMUSCULAR | Status: AC
  Filled 2023-03-20: qty 20

## 2023-03-20 MED ORDER — SODIUM CHLORIDE (PF) 0.9 % IJ SOLN
INTRAMUSCULAR | Status: AC
  Filled 2023-03-20: qty 10

## 2023-03-20 NOTE — Patient Instructions (Signed)

## 2023-03-20 NOTE — Progress Notes (Signed)
Safety precautions to be maintained throughout the outpatient stay will include: orient to surroundings, keep bed in low position, maintain call bell within reach at all times, provide assistance with transfer out of bed and ambulation.  

## 2023-03-20 NOTE — Progress Notes (Addendum)
PROVIDER NOTE: Interpretation of information contained herein should be left to medically-trained personnel. Specific patient instructions are provided elsewhere under "Patient Instructions" section of medical record. This document was created in part using STT-dictation technology, any transcriptional errors that may result from this process are unintentional.  Patient: Erica Walters Type: Established DOB: 1965/11/27 MRN: [id number] PCP: Donita Brooks, MD  Service: Procedure DOS: 03/20/2023 Setting: Ambulatory Location: Ambulatory outpatient facility Delivery: Face-to-face Provider: Edward Jolly, MD Specialty: Interventional Pain Management Specialty designation: 09 Location: Outpatient facility Ref. Prov.: Edward Jolly, MD    Primary Reason for Visit: Interventional Pain Management Treatment. CC: Back Pain (Lumbar bilateral )   Procedure:           Type: Lumbar epidural steroid injection (LESI) (interlaminar) #2    Laterality: Left   Level:  L3-4 Level.  Imaging: Fluoroscopic guidance         Anesthesia: Local anesthesia (1-2% Lidocaine) DOS: 03/20/2023  Performed by: Edward Jolly, MD  Purpose: Diagnostic/Therapeutic Indications: Lumbar radicular pain of intraspinal etiology of more than 4 weeks that has failed to respond to conservative therapy and is severe enough to impact quality of life or function. 1. Lumbar disc herniation with radiculopathy   2. Lumbar radicular pain   3. Chronic radicular lumbar pain    NAS-11 Pain score:   Pre-procedure: 4 /10   Post-procedure: 0-No pain/10      Position / Prep / Materials:  Position: Prone w/ head of the table raised (slight reverse trendelenburg) to facilitate breathing.  Prep solution: DuraPrep (Iodine Povacrylex [0.7% available iodine] and Isopropyl Alcohol, 74% w/w) Prep Area: Entire Posterior Lumbar Region from lower scapular tip down to mid buttocks area and from flank to flank. Materials:  Tray: Epidural  tray Needle(s):  Type: Epidural needle (Tuohy) Gauge (G):  22 Length: Regular (3.5-in) Qty: 1  Pre-op H&P Assessment:  Ms. Cullum is a 57 y.o. (year old), female patient, seen today for interventional treatment. She  has a past surgical history that includes Total abdominal hysterectomy w/ bilateral salpingoophorectomy and Colonoscopy (12/04/2017). Ms. Grzybek has a current medication list which includes the following prescription(s): celecoxib, cyclobenzaprine, losartan, nortriptyline, pregabalin, wegovy, and tramadol, and the following Facility-Administered Medications: dexamethasone, iohexol, lidocaine, ropivacaine (pf) 2 mg/ml (0.2%), and sodium chloride flush. Her primarily concern today is the Back Pain (Lumbar bilateral )  Initial Vital Signs:  Pulse/HCG Rate: 95  Temp:  (!) 97.3 F (36.3 C) Resp: 16 BP: 122/85 SpO2: 100 %  BMI: Estimated body mass index is 34.69 kg/m as calculated from the following:   Height as of this encounter: 5\' 6"  (1.676 m).   Weight as of this encounter: 214 lb 14.4 oz (97.5 kg).  Risk Assessment: Allergies: Reviewed. She has No Known Allergies.  Allergy Precautions: None required Coagulopathies: Reviewed. None identified.  Blood-thinner therapy: None at this time Active Infection(s): Reviewed. None identified. Ms. Sumerlin is afebrile  Site Confirmation: Ms. Graper was asked to confirm the procedure and laterality before marking the site Procedure checklist: Completed Consent: Before the procedure and under the influence of no sedative(s), amnesic(s), or anxiolytics, the patient was informed of the treatment options, risks and possible complications. To fulfill our ethical and legal obligations, as recommended by the American Medical Association's Code of Ethics, I have informed the patient of my clinical impression; the nature and purpose of the treatment or procedure; the risks, benefits, and possible complications of the intervention; the  alternatives, including doing nothing; the risk(s) and benefit(s) of the  alternative treatment(s) or procedure(s); and the risk(s) and benefit(s) of doing nothing. The patient was provided information about the general risks and possible complications associated with the procedure. These may include, but are not limited to: failure to achieve desired goals, infection, bleeding, organ or nerve damage, allergic reactions, paralysis, and death. In addition, the patient was informed of those risks and complications associated to Spine-related procedures, such as failure to decrease pain; infection (i.e.: Meningitis, epidural or intraspinal abscess); bleeding (i.e.: epidural hematoma, subarachnoid hemorrhage, or any other type of intraspinal or peri-dural bleeding); organ or nerve damage (i.e.: Any type of peripheral nerve, nerve root, or spinal cord injury) with subsequent damage to sensory, motor, and/or autonomic systems, resulting in permanent pain, numbness, and/or weakness of one or several areas of the body; allergic reactions; (i.e.: anaphylactic reaction); and/or death. Furthermore, the patient was informed of those risks and complications associated with the medications. These include, but are not limited to: allergic reactions (i.e.: anaphylactic or anaphylactoid reaction(s)); adrenal axis suppression; blood sugar elevation that in diabetics may result in ketoacidosis or comma; water retention that in patients with history of congestive heart failure may result in shortness of breath, pulmonary edema, and decompensation with resultant heart failure; weight gain; swelling or edema; medication-induced neural toxicity; particulate matter embolism and blood vessel occlusion with resultant organ, and/or nervous system infarction; and/or aseptic necrosis of one or more joints. Finally, the patient was informed that Medicine is not an exact science; therefore, there is also the possibility of unforeseen or  unpredictable risks and/or possible complications that may result in a catastrophic outcome. The patient indicated having understood very clearly. We have given the patient no guarantees and we have made no promises. Enough time was given to the patient to ask questions, all of which were answered to the patient's satisfaction. Ms. Swofford has indicated that she wanted to continue with the procedure. Attestation: I, the ordering provider, attest that I have discussed with the patient the benefits, risks, side-effects, alternatives, likelihood of achieving goals, and potential problems during recovery for the procedure that I have provided informed consent. Date  Time: 03/20/2023  9:16 AM  Pre-Procedure Preparation:  Monitoring: As per clinic protocol. Respiration, ETCO2, SpO2, BP, heart rate and rhythm monitor placed and checked for adequate function Safety Precautions: Patient was assessed for positional comfort and pressure points before starting the procedure. Time-out: I initiated and conducted the "Time-out" before starting the procedure, as per protocol. The patient was asked to participate by confirming the accuracy of the "Time Out" information. Verification of the correct person, site, and procedure were performed and confirmed by me, the nursing staff, and the patient. "Time-out" conducted as per Joint Commission's Universal Protocol (UP.01.01.01). Time: 0949  Description/Narrative of Procedure:          Target: Epidural space via interlaminar opening, initially targeting the lower laminar border of the superior vertebral body. Region: Lumbar Approach: Percutaneous paravertebral  Rationale (medical necessity): procedure needed and proper for the diagnosis and/or treatment of the patient's medical symptoms and needs. Procedural Technique Safety Precautions: Aspiration looking for blood return was conducted prior to all injections. At no point did we inject any substances, as a needle was  being advanced. No attempts were made at seeking any paresthesias. Safe injection practices and needle disposal techniques used. Medications properly checked for expiration dates. SDV (single dose vial) medications used. Description of the Procedure: Protocol guidelines were followed. The procedure needle was introduced through the skin, ipsilateral to the reported  pain, and advanced to the target area. Bone was contacted and the needle walked caudad, until the lamina was cleared. The epidural space was identified using "loss-of-resistance technique" with 2-3 ml of PF-NaCl (0.9% NSS), in a 5cc LOR glass syringe.   6 cc solution made of 3 cc of preservative-free saline, 2 cc of 0.2% ropivacaine, 1 cc of Decadron 10 mg/cc.   Vitals:   03/20/23 0917 03/20/23 0948 03/20/23 0952  BP: 122/85 (!) 147/102 (!) 147/99  Pulse: 95 95 96  Resp: 16 15 16   Temp: (!) 97.3 F (36.3 C)    TempSrc: Temporal    SpO2: 100% 96% 96%  Weight: 214 lb 14.4 oz (97.5 kg)    Height: 5\' 6"  (1.676 m)      Start Time: 0949 hrs. End Time: 0952 hrs.  Imaging Guidance (Spinal):          Type of Imaging Technique: Fluoroscopy Guidance (Spinal) Indication(s): Assistance in needle guidance and placement for procedures requiring needle placement in or near specific anatomical locations not easily accessible without such assistance. Exposure Time: Please see nurses notes. Contrast: Before injecting any contrast, we confirmed that the patient did not have an allergy to iodine, shellfish, or radiological contrast. Once satisfactory needle placement was completed at the desired level, radiological contrast was injected. Contrast injected under live fluoroscopy. No contrast complications. See chart for type and volume of contrast used. Fluoroscopic Guidance: I was personally present during the use of fluoroscopy. "Tunnel Vision Technique" used to obtain the best possible view of the target area. Parallax error corrected before  commencing the procedure. "Direction-depth-direction" technique used to introduce the needle under continuous pulsed fluoroscopy. Once target was reached, antero-posterior, oblique, and lateral fluoroscopic projection used confirm needle placement in all planes. Images permanently stored in EMR. Interpretation: I personally interpreted the imaging intraoperatively. Adequate needle placement confirmed in multiple planes. Appropriate spread of contrast into desired area was observed. No evidence of afferent or efferent intravascular uptake. No intrathecal or subarachnoid spread observed. Permanent images saved into the patient's record.   Post-operative Assessment:  Post-procedure Vital Signs:  Pulse/HCG Rate: 96  Temp:  (!) 97.3 F (36.3 C) Resp: 16 BP:  (!) 147/99 SpO2: 96 %  EBL: None  Complications: No immediate post-treatment complications observed by team, or reported by patient.  Note: The patient tolerated the entire procedure well. A repeat set of vitals were taken after the procedure and the patient was kept under observation following institutional policy, for this type of procedure. Post-procedural neurological assessment was performed, showing return to baseline, prior to discharge. The patient was provided with post-procedure discharge instructions, including a section on how to identify potential problems. Should any problems arise concerning this procedure, the patient was given instructions to immediately contact us, at any time, without hesitation. In any case, we plan to contact the patient by telephone for a follow-up status report regarding this interventional procedure.  Comments:  No additional relevant information.  Plan of Care  Orders:  No orders of the defined types were placed in this encounter.  5 out of 5 strength bilateral lower extremity: Plantar flexion, dorsiflexion, knee flexion, knee extension.   Medications ordered for procedure: Meds ordered this  encounter  Medications   iohexol (OMNIPAQUE) 180 MG/ML injection 10 mL    Must be Myelogram-compatible. If not available, you may substitute with a water-soluble, non-ionic, hypoallergenic, myelogram-compatible radiological contrast medium.   lidocaine (XYLOCAINE) 2 % (with pres) injection 400 mg   ropivacaine (PF)  2 mg/mL (0.2%) (NAROPIN) injection 2 mL   sodium chloride flush (NS) 0.9 % injection 2 mL   dexamethasone (DECADRON) injection 10 mg   Medications administered: Michaelene Song had no medications administered during this visit.  See the medical record for exact dosing, route, and time of administration.  Follow-up plan:   Return in about 6 weeks (around 05/01/2023) for f13f PPE.     Recent Visits Date Type Provider Dept  02/11/23 Office Visit Edward Jolly, MD [facility]  01/14/23 Procedure visit Edward Jolly, MD [facility]  12/20/22 Office Visit Edward Jolly, MD [facility]  Showing recent visits within past 90 days and meeting all other requirements Today's Visits Date Type Provider Dept  03/20/23 Procedure visit Edward Jolly, MD [facility]  Showing today's visits and meeting all other requirements Future Appointments No visits were found meeting these conditions. Showing future appointments within next 90 days and meeting all other requirements  Disposition: Discharge home  Discharge (Date  Time): 03/20/2023; 0956 hrs.   Primary Care Physician: Donita Brooks, MD Location: [facility] Note by: Edward Jolly, MD Date: 03/20/2023; Time: 9:58 AM  Disclaimer:  Medicine is not an exact science. The only guarantee in medicine is that nothing is guaranteed. It is important to note that the decision to proceed with this intervention was based on the information collected from the patient. The Data and conclusions were drawn from the patient's questionnaire, the interview, and the physical  examination. Because the information was provided in large part by the patient, it cannot be guaranteed that it has not been purposely or unconsciously manipulated. Every effort has been made to obtain as much relevant data as possible for this evaluation. It is important to note that the conclusions that lead to this procedure are derived in large part from the available data. Always take into account that the treatment will also be dependent on availability of resources and existing treatment guidelines, considered by other Pain Management Practitioners as being common knowledge and practice, at the time of the intervention. For Medico-Legal purposes, it is also important to point out that variation in procedural techniques and pharmacological choices are the acceptable norm. The indications, contraindications, technique, and results of the above procedure should only be interpreted and judged by a Board-Certified Interventional Pain Specialist with extensive familiarity and expertise in the same exact procedure and technique.

## 2023-03-20 NOTE — Addendum Note (Signed)
Addended by: Edward Jolly on: 03/20/2023 09:58 AM   Modules accepted: Orders

## 2023-03-21 ENCOUNTER — Telehealth: Payer: Self-pay | Admitting: *Deleted

## 2023-03-21 NOTE — Telephone Encounter (Signed)
Attempted to call for post procedure follow-up. Message left.

## 2023-03-22 ENCOUNTER — Ambulatory Visit (INDEPENDENT_AMBULATORY_CARE_PROVIDER_SITE_OTHER): Payer: 59 | Admitting: Family Medicine

## 2023-03-22 ENCOUNTER — Encounter: Payer: Self-pay | Admitting: Family Medicine

## 2023-03-22 VITALS — BP 118/72 | HR 97 | Temp 97.9°F | Ht 66.0 in | Wt 215.0 lb

## 2023-03-22 DIAGNOSIS — Z789 Other specified health status: Secondary | ICD-10-CM | POA: Diagnosis not present

## 2023-03-22 DIAGNOSIS — G5793 Unspecified mononeuropathy of bilateral lower limbs: Secondary | ICD-10-CM

## 2023-03-22 MED ORDER — WEGOVY 1 MG/0.5ML ~~LOC~~ SOAJ
1.0000 mg | SUBCUTANEOUS | 1 refills | Status: DC
Start: 2023-03-22 — End: 2023-04-18

## 2023-03-22 NOTE — Progress Notes (Signed)
Subjective:    Patient ID: Erica Walters, female    DOB: 08/28/1965, 57 y.o.   MRN: [id number]  HPI Patient is currently on 0.5 mg of Wegovy subcu weekly.  She is lost 7 pounds since she started the medication.  She has done well the last month side effects.  Specifically she denies any abdominal pain or nausea. Past Medical History:  Diagnosis Date   Allergy    Arthritis    Hyperlipidemia    Past Surgical History:  Procedure Laterality Date   COLONOSCOPY  12/04/2017   TOTAL ABDOMINAL HYSTERECTOMY W/ BILATERAL SALPINGOOPHORECTOMY     Current Outpatient Medications on File Prior to Visit  Medication Sig Dispense Refill   celecoxib (CELEBREX) 100 MG capsule Take 1 capsule (100 mg total) by mouth 2 (two) times daily. 180 capsule 1   cyclobenzaprine (FLEXERIL) 5 MG tablet Take 1 tablet (5 mg total) by mouth 3 (three) times daily as needed for muscle spasms. 90 tablet 2   losartan (COZAAR) 50 MG tablet Take 1 tablet (50 mg total) by mouth daily. 90 tablet 3   nortriptyline (PAMELOR) 50 MG capsule Take 1 capsule (50 mg total) by mouth at bedtime. 30 capsule 5   pregabalin (LYRICA) 100 MG capsule Take 1 capsule (100 mg total) by mouth 2 (two) times daily. 60 capsule 5   Semaglutide-Weight Management (WEGOVY) 0.5 MG/0.5ML SOAJ Inject 0.5 mg into the skin once a week. 2 mL 1   traMADol (ULTRAM) 50 MG tablet Take 1 tablet (50 mg total) by mouth every 8 (eight) hours as needed. 90 tablet 1   No current facility-administered medications on file prior to visit.   No Known Allergies Social History   Socioeconomic History   Marital status: Married    Spouse name: Not on file   Number of children: Not on file   Years of education: Not on file   Highest education level: Not on file  Occupational History   Not on file  Tobacco Use   Smoking status: Every Day    Current packs/day: 0.50    Average packs/day: 0.5 packs/day for 26.0 years (13.0 ttl pk-yrs)    Types: Cigarettes   Smokeless  tobacco: Never  Vaping Use   Vaping status: Never Used  Substance and Sexual Activity   Alcohol use: Yes    Alcohol/week: 14.0 standard drinks of alcohol    Types: 14 Glasses of wine per week    Comment: daily wine per pt   Drug use: Never   Sexual activity: Not Currently  Other Topics Concern   Not on file  Social History Narrative   Not on file   Social Determinants of Health   Financial Resource Strain: Not on file  Food Insecurity: Not on file  Transportation Needs: Not on file  Physical Activity: Not on file  Stress: Not on file  Social Connections: Not on file  Intimate Partner Violence: Not on file     Review of Systems  All other systems reviewed and are negative.      Objective:   Physical Exam Constitutional:      General: She is not in acute distress.    Appearance: She is obese. She is not ill-appearing or toxic-appearing.  Cardiovascular:     Rate and Rhythm: Normal rate and regular rhythm.     Heart sounds: Normal heart sounds.  Pulmonary:     Effort: Pulmonary effort is normal.     Breath sounds: Normal breath sounds.  Feet:     Right foot:     Skin integrity: Skin integrity normal. No ulcer, blister or skin breakdown.     Left foot:     Skin integrity: Skin integrity normal. No ulcer, blister or skin breakdown.  Neurological:     Mental Status: She is alert.          Assessment & Plan:  Weight loss advised - Plan: Ambulatory referral to Pain Clinic I am very proud of the patient for losing 7 pounds.  I have recommended that she increase Wegovy to 1 mg subcu weekly.  We will continue to uptitrate to the maximum effective dose on a monthly basis.  Patient also needs a referral back to her pain clinic who treats her neuropathy.  I am happy to do that today

## 2023-03-25 ENCOUNTER — Encounter: Payer: Self-pay | Admitting: Gastroenterology

## 2023-04-10 ENCOUNTER — Ambulatory Visit: Payer: 59

## 2023-04-10 VITALS — Ht 66.0 in | Wt 210.0 lb

## 2023-04-10 DIAGNOSIS — Z860100 Personal history of colon polyps, unspecified: Secondary | ICD-10-CM

## 2023-04-10 NOTE — Progress Notes (Signed)
Pre visit completed via phone call; Patient verified name, DOB, and address; No egg or soy allergy known to patient;  No issues known to pt with past sedation with any surgeries or procedures; Patient denies ever being told they had issues or difficulty with intubation;  No FH of Malignant Hyperthermia; Pt is not on diet pills; Pt is not on home 02;  Pt is not on blood thinners; Pt reports issues with constipation - patient reports she has a bowel movement every other day usually and is aware that the [facility] will affect how often and consistency of bowel movements- patient advised to increase oral fluids, activity as tolerated, and fruits and veggies that are allowed prior to starting prep;  No A fib or A flutter; Have any cardiac testing pending--NO Insurance verified during PV appt--- [employer] Pt can ambulate without assistance;  Pt denies use of chewing tobacco; Discussed diabetic/weight loss medication holds; Discussed NSAID holds; Checked BMI to be less than 50; Pt instructed to use [employer] or GoodRx for a price reduction on prep;  Patient's chart reviewed by Cathlyn Parsons CNRA prior to previsit and patient appropriate for the [facility];  Pre visit completed and red dot placed by patient's name on their procedure day (on provider's schedule);    Instructions sent to MyChart per patient request;

## 2023-04-15 ENCOUNTER — Encounter: Payer: Self-pay | Admitting: Student in an Organized Health Care Education/Training Program

## 2023-04-15 DIAGNOSIS — G894 Chronic pain syndrome: Secondary | ICD-10-CM

## 2023-04-15 MED ORDER — DICLOFENAC SODIUM 50 MG PO TBEC: 50.0000 mg | DELAYED_RELEASE_TABLET | Freq: Two times a day (BID) | ORAL | 2 refills | Status: DC

## 2023-04-18 ENCOUNTER — Encounter: Payer: Self-pay | Admitting: Family Medicine

## 2023-04-18 ENCOUNTER — Other Ambulatory Visit: Payer: Self-pay

## 2023-04-18 ENCOUNTER — Encounter: Payer: Self-pay | Admitting: Gastroenterology

## 2023-04-18 DIAGNOSIS — Z789 Other specified health status: Secondary | ICD-10-CM

## 2023-04-18 DIAGNOSIS — E669 Obesity, unspecified: Secondary | ICD-10-CM

## 2023-04-18 DIAGNOSIS — E7800 Pure hypercholesterolemia, unspecified: Secondary | ICD-10-CM

## 2023-04-18 MED ORDER — WEGOVY 1.7 MG/0.75ML ~~LOC~~ SOAJ: 1.7000 mg | SUBCUTANEOUS | 1 refills | Status: DC

## 2023-04-24 ENCOUNTER — Encounter: Payer: Self-pay | Admitting: Student in an Organized Health Care Education/Training Program

## 2023-05-01 ENCOUNTER — Ambulatory Visit: Payer: 59 | Admitting: Gastroenterology

## 2023-05-01 ENCOUNTER — Encounter: Payer: Self-pay | Admitting: Gastroenterology

## 2023-05-01 ENCOUNTER — Ambulatory Visit: Payer: 59 | Admitting: Student in an Organized Health Care Education/Training Program

## 2023-05-01 VITALS — BP 120/71 | HR 71 | Temp 97.8°F | Resp 12 | Ht 66.0 in | Wt 210.0 lb

## 2023-05-01 DIAGNOSIS — D120 Benign neoplasm of cecum: Secondary | ICD-10-CM | POA: Diagnosis not present

## 2023-05-01 DIAGNOSIS — Z860101 Personal history of adenomatous and serrated colon polyps: Secondary | ICD-10-CM | POA: Diagnosis not present

## 2023-05-01 DIAGNOSIS — Z860100 Personal history of colon polyps, unspecified: Secondary | ICD-10-CM

## 2023-05-01 DIAGNOSIS — D123 Benign neoplasm of transverse colon: Secondary | ICD-10-CM | POA: Diagnosis not present

## 2023-05-01 DIAGNOSIS — D122 Benign neoplasm of ascending colon: Secondary | ICD-10-CM

## 2023-05-01 DIAGNOSIS — Z09 Encounter for follow-up examination after completed treatment for conditions other than malignant neoplasm: Secondary | ICD-10-CM

## 2023-05-01 MED ORDER — SODIUM CHLORIDE 0.9 % IV SOLN: 500.0000 mL | INTRAVENOUS | Status: DC

## 2023-05-01 NOTE — Progress Notes (Signed)
Called to room to assist during endoscopic procedure.  Patient ID and intended procedure confirmed with present staff. Received instructions for my participation in the procedure from the performing physician.  

## 2023-05-01 NOTE — Patient Instructions (Signed)
Educational handout provided to patient related to Polyps  Resume previous diet  Continue present medications- NO ASPIRIN, IBUPROFEN, NAPROXEN, OR ANY OTHER NSAID FOR 1 WEEK AFTER POLYP REMOVAL  Awaiting pathology results  REPEAT COLONOSCOPY IN 1 YEAR FOR SURVEILLANCE.  YOU HAD AN ENDOSCOPIC PROCEDURE TODAY AT THE [facility]:   Refer to the procedure report that was given to you for any specific questions about what was found during the examination.  If the procedure report does not answer your questions, please call your gastroenterologist to clarify.  If you requested that your care partner not be given the details of your procedure findings, then the procedure report has been included in a sealed envelope for you to review at your convenience later.  YOU SHOULD EXPECT: Some feelings of bloating in the abdomen. Passage of more gas than usual.  Walking can help get rid of the air that was put into your GI tract during the procedure and reduce the bloating. If you had a lower endoscopy (such as a colonoscopy or flexible sigmoidoscopy) you may notice spotting of blood in your stool or on the toilet paper. If you underwent a bowel prep for your procedure, you may not have a normal bowel movement for a few days.  Please Note:  You might notice some irritation and congestion in your nose or some drainage.  This is from the oxygen used during your procedure.  There is no need for concern and it should clear up in a day or so.  SYMPTOMS TO REPORT IMMEDIATELY:  Following lower endoscopy (colonoscopy or flexible sigmoidoscopy):  Excessive amounts of blood in the stool  Significant tenderness or worsening of abdominal pains  Swelling of the abdomen that is new, acute  Fever of 100F or higher   For urgent or emergent issues, a gastroenterologist can be reached at any hour by calling [phone]. Do not use MyChart messaging for urgent concerns.    DIET:  We do recommend a small  meal at first, but then you may proceed to your regular diet.  Drink plenty of fluids but you should avoid alcoholic beverages for 24 hours.  ACTIVITY:  You should plan to take it easy for the rest of today and you should NOT DRIVE or use heavy machinery until tomorrow (because of the sedation medicines used during the test).    FOLLOW UP: Our staff will call the number listed on your records the next business day following your procedure.  We will call around 7:15- 8:00 am to check on you and address any questions or concerns that you may have regarding the information given to you following your procedure. If we do not reach you, we will leave a message.     If any biopsies were taken you will be contacted by phone or by letter within the next 1-3 weeks.  Please call us at [phone] if you have not heard about the biopsies in 3 weeks.    SIGNATURES/CONFIDENTIALITY: You and/or your care partner have signed paperwork which will be entered into your electronic medical record.  These signatures attest to the fact that that the information above on your After Visit Summary has been reviewed and is understood.  Full responsibility of the confidentiality of this discharge information lies with you and/or your care-partner.

## 2023-05-01 NOTE — Progress Notes (Signed)
History and Physical:  This patient presents for endoscopic testing for: Encounter Diagnosis  Name Primary?   Hx of colonic polyps Yes    Surveillance colonoscopy today TA x 9 in June 2019 TA x 4 in Nov 2020 (2 yr recall recommended) Patient denies chronic abdominal pain, rectal bleeding, constipation or diarrhea.   Patient is otherwise without complaints or active issues today.   Past Medical History: Past Medical History:  Diagnosis Date   Allergy    Arthritis    DJD (degenerative joint disease)    Hyperlipidemia      Past Surgical History: Past Surgical History:  Procedure Laterality Date   COLONOSCOPY  12/04/2017   COLONOSCOPY  2020   HD-MAC-miralax(good)-3 polyps   TOTAL ABDOMINAL HYSTERECTOMY W/ BILATERAL SALPINGOOPHORECTOMY      Allergies: No Known Allergies  Outpatient Meds: Current Outpatient Medications  Medication Sig Dispense Refill   nortriptyline (PAMELOR) 50 MG capsule Take 1 capsule (50 mg total) by mouth at bedtime. 30 capsule 5   pregabalin (LYRICA) 100 MG capsule Take 1 capsule (100 mg total) by mouth 2 (two) times daily. 60 capsule 5   cyclobenzaprine (FLEXERIL) 5 MG tablet Take 1 tablet (5 mg total) by mouth 3 (three) times daily as needed for muscle spasms. 90 tablet 2   diclofenac (VOLTAREN) 50 MG EC tablet Take 1 tablet (50 mg total) by mouth 2 (two) times daily after a meal. 60 tablet 2   losartan (COZAAR) 50 MG tablet Take 1 tablet (50 mg total) by mouth daily. 90 tablet 3   Semaglutide-Weight Management (WEGOVY) 1.7 MG/0.75ML SOAJ Inject 1.7 mg into the skin once a week. 3 mL 1   traMADol (ULTRAM) 50 MG tablet Take 1 tablet (50 mg total) by mouth every 8 (eight) hours as needed. 90 tablet 1   Current Facility-Administered Medications  Medication Dose Route Frequency Provider Last Rate Last Admin   0.9 %  sodium chloride infusion  500 mL Intravenous Continuous Charlie Pitter III, MD           ___________________________________________________________________ Objective   Exam:  BP 134/62   Pulse 70   Temp 97.8 F (36.6 C)   Ht 5\' 6"  (1.676 m)   Wt 210 lb (95.3 kg)   SpO2 95%   BMI 33.89 kg/m   CV: regular , S1/S2 Resp: clear to auscultation bilaterally, normal RR and effort noted GI: soft, no tenderness, with active bowel sounds.   Assessment: Encounter Diagnosis  Name Primary?   Hx of colonic polyps Yes     Plan: Colonoscopy   The benefits and risks of the planned procedure were described in detail with the patient or (when appropriate) their health care proxy.  Risks were outlined as including, but not limited to, bleeding, infection, perforation, adverse medication reaction leading to cardiac or pulmonary decompensation, pancreatitis (if ERCP).  The limitation of incomplete mucosal visualization was also discussed.  No guarantees or warranties were given.  The patient is appropriate for an endoscopic procedure in the ambulatory setting.   - Amada Jupiter, MD

## 2023-05-01 NOTE — Progress Notes (Signed)
Sedate, gd SR, tolerated procedure well, VSS, report to RN 

## 2023-05-01 NOTE — Op Note (Signed)
[facility] Patient Name: Erica Walters Procedure Date: 05/01/2023 8:45 AM MRN: [id number] Endoscopist: Sherilyn Cooter L. Myrtie Neither , MD, [id number] Age: 57 Referring MD:  Date of Birth: February 21, 1966 Gender: Female Account #: [id number] Procedure:                Colonoscopy Indications:              Surveillance: Personal history of adenomatous                            polyps on last colonoscopy 3 years ago                           9 tubular adenomas (some greater than 10 mm) on                            first colonoscopy June 2019                           4 subcentimeter tubular adenomas November 20                            20-2-year recall recommended Medicines:                Monitored Anesthesia Care Procedure:                Pre-Anesthesia Assessment:                           - Prior to the procedure, a History and Physical                            was performed, and patient medications and                            allergies were reviewed. The patient's tolerance of                            previous anesthesia was also reviewed. The risks                            and benefits of the procedure and the sedation                            options and risks were discussed with the patient.                            All questions were answered, and informed consent                            was obtained. Prior Anticoagulants: The patient has                            taken no anticoagulant or antiplatelet agents. ASA  Grade Assessment: II - A patient with mild systemic                            disease. After reviewing the risks and benefits,                            the patient was deemed in satisfactory condition to                            undergo the procedure.                           After obtaining informed consent, the colonoscope                            was passed under direct vision. Throughout the                             procedure, the patient's blood pressure, pulse, and                            oxygen saturations were monitored continuously. The                            CF [id number] #[id number] was introduced through the anus                            and advanced to the the cecum, identified by                            appendiceal orifice and ileocecal valve. The                            colonoscopy was somewhat difficult due to a                            redundant colon and significant looping. Successful                            completion of the procedure was aided by using                            manual pressure, straightening and shortening the                            scope to obtain bowel loop reduction and lavage.                            The patient tolerated the procedure well. The                            quality of the bowel preparation was fair in some  areas despite extensive lavage. The ileocecal                            valve, appendiceal orifice, and rectum were                            photographed. The bowel preparation used was 2 day                            Suprep/Miralax. (Vomited some of the morning dose) Scope In: 8:50:13 AM Scope Out: 9:18:32 AM Scope Withdrawal Time: 0 hours 23 minutes 29 seconds  Total Procedure Duration: 0 hours 28 minutes 19 seconds  Findings:                 The perianal and digital rectal examinations were                            normal.                           Repeat examination of right colon under NBI                            performed.                           An 8-10 mm polyp was found in the cecum. The polyp                            was multi-lobulated and sessile. The polyp was                            removed with a cold snare. Resection and retrieval                            were complete. (Jar 1)                           A 6 mm polyp was found in the proximal ascending                             colon. The polyp was sessile. The polyp was removed                            with a cold snare. Resection and retrieval were                            complete. (Jar 2)                           A 20 mm polyp was found in the distal transverse                            colon. The polyp was multi-lobulated and sessile.  The polyp was removed with a piecemeal technique                            using a cold snare. Resection and retrieval were                            complete. Area was tattooed with an injection of                            0.5 mL of Spot (carbon black). (Jar 2)                           The exam was otherwise without abnormality on                            direct and retroflexion views. Complications:            No immediate complications. Estimated Blood Loss:     Estimated blood loss was minimal. Impression:               - Preparation of the colon was fair in some areas                            despite lavage.                           - One 10 mm polyp in the cecum, removed with a cold                            snare. Resected and retrieved.                           - One 6 mm polyp in the proximal ascending colon,                            removed with a cold snare. Resected and retrieved.                           - One 20 mm polyp in the distal transverse colon,                            removed piecemeal using a cold snare. Resected and                            retrieved. Tattooed.                           - The examination was otherwise normal on direct                            and retroflexion views. Recommendation:           - Patient has a contact number available for  emergencies. The signs and symptoms of potential                            delayed complications were discussed with the                            patient. Return to normal activities tomorrow.                             Written discharge instructions were provided to the                            patient.                           - Resume previous diet.                           - Continue present medications.                           - No aspirin, ibuprofen, naproxen, or other                            non-steroidal anti-inflammatory drugs for 1 week                            after polyp removal.                           - Await pathology results.                           - Repeat colonoscopy in 1 year for surveillance.                            For next colonoscopy, split dose GoLytely prep with                            metoclopramide premedication Jahlil Ziller L. Myrtie Neither, MD 05/01/2023 9:25:52 AM This report has been signed electronically.

## 2023-05-02 ENCOUNTER — Telehealth: Payer: Self-pay

## 2023-05-02 NOTE — Telephone Encounter (Signed)
  Follow up Call-     05/01/2023    7:53 AM  Call back number  Post procedure Call Back phone  # [phone]  Permission to leave phone message Yes     Patient questions:  Do you have a fever, pain , or abdominal swelling? No. Pain Score  0 *  Have you tolerated food without any problems? Yes.    Have you been able to return to your normal activities? Yes.    Do you have any questions about your discharge instructions: Diet   No. Medications  No. Follow up visit  No.  Do you have questions or concerns about your Care? Patient stated she still had some gas pains. Advised to get up and walk more, increase hot liquids to relieve some gas. Advised to call back if gas pains get worse or do not resolve within a day or two. Patient verbalizes understanding.  Actions: * If pain score is 4 or above: No action needed, pain <4.

## 2023-05-03 LAB — SURGICAL PATHOLOGY

## 2023-05-06 ENCOUNTER — Encounter: Payer: Self-pay | Admitting: Gastroenterology

## 2023-05-09 ENCOUNTER — Ambulatory Visit
Payer: 59 | Attending: Student in an Organized Health Care Education/Training Program | Admitting: Student in an Organized Health Care Education/Training Program

## 2023-05-09 ENCOUNTER — Encounter: Payer: Self-pay | Admitting: Student in an Organized Health Care Education/Training Program

## 2023-05-09 VITALS — BP 113/80 | HR 91 | Temp 96.8°F | Resp 16 | Ht 66.0 in | Wt 208.3 lb

## 2023-05-09 DIAGNOSIS — M5416 Radiculopathy, lumbar region: Secondary | ICD-10-CM | POA: Diagnosis present

## 2023-05-09 DIAGNOSIS — G5793 Unspecified mononeuropathy of bilateral lower limbs: Secondary | ICD-10-CM | POA: Insufficient documentation

## 2023-05-09 DIAGNOSIS — G8929 Other chronic pain: Secondary | ICD-10-CM | POA: Diagnosis present

## 2023-05-09 DIAGNOSIS — M549 Dorsalgia, unspecified: Secondary | ICD-10-CM | POA: Diagnosis present

## 2023-05-09 DIAGNOSIS — G894 Chronic pain syndrome: Secondary | ICD-10-CM | POA: Diagnosis present

## 2023-05-09 DIAGNOSIS — G5702 Lesion of sciatic nerve, left lower limb: Secondary | ICD-10-CM | POA: Insufficient documentation

## 2023-05-09 DIAGNOSIS — M5412 Radiculopathy, cervical region: Secondary | ICD-10-CM | POA: Diagnosis present

## 2023-05-09 DIAGNOSIS — M47816 Spondylosis without myelopathy or radiculopathy, lumbar region: Secondary | ICD-10-CM | POA: Diagnosis present

## 2023-05-09 DIAGNOSIS — M533 Sacrococcygeal disorders, not elsewhere classified: Secondary | ICD-10-CM | POA: Diagnosis present

## 2023-05-09 DIAGNOSIS — M5116 Intervertebral disc disorders with radiculopathy, lumbar region: Secondary | ICD-10-CM | POA: Diagnosis present

## 2023-05-09 MED ORDER — CELECOXIB 100 MG PO CAPS: 100.0000 mg | ORAL_CAPSULE | Freq: Two times a day (BID) | ORAL | 5 refills | Status: DC

## 2023-05-09 MED ORDER — TRAMADOL HCL 50 MG PO TABS: 50.0000 mg | ORAL_TABLET | Freq: Two times a day (BID) | ORAL | 5 refills | Status: DC | PRN

## 2023-05-09 MED ORDER — NORTRIPTYLINE HCL 50 MG PO CAPS: 50.0000 mg | ORAL_CAPSULE | Freq: Every day | ORAL | 5 refills | Status: DC

## 2023-05-09 MED ORDER — PREGABALIN 100 MG PO CAPS: 100.0000 mg | ORAL_CAPSULE | Freq: Two times a day (BID) | ORAL | 5 refills | Status: DC

## 2023-05-09 NOTE — Progress Notes (Signed)
Nursing Pain Medication Assessment:  Safety precautions to be maintained throughout the outpatient stay will include: orient to surroundings, keep bed in low position, maintain call bell within reach at all times, provide assistance with transfer out of bed and ambulation.  Medication Inspection Compliance: Ms. Purinton did not comply with our request to bring her pills to be counted. She was reminded that bringing the medication bottles, even when empty, is a requirement.  Medication: None brought in. Pill/Patch Count: None available to be counted. Bottle Appearance: No container available. Did not bring bottle(s) to appointment. Filled Date: N/A Last Medication intake:   Pt states she ran out of Tramadol 2-3 weeks ago, but saved 1 and took that today

## 2023-05-09 NOTE — Progress Notes (Signed)
Safety precautions to be maintained throughout the outpatient stay will include: orient to surroundings, keep bed in low position, maintain call bell within reach at all times, provide assistance with transfer out of bed and ambulation.  

## 2023-05-09 NOTE — Progress Notes (Signed)
PROVIDER NOTE: Information contained herein reflects review and annotations entered in association with encounter. Interpretation of such information and data should be left to medically-trained personnel. Information provided to patient can be located elsewhere in the medical record under "Patient Instructions". Document created using STT-dictation technology, any transcriptional errors that may result from process are unintentional.    Patient: Erica Walters  Service Category: E/M  Provider: Edward Jolly, MD  DOB: Mar 23, 1966  DOS: 05/09/2023  Referring Provider: Donita Brooks, MD  MRN: [id number]  Specialty: Interventional Pain Management  PCP: Donita Brooks, MD  Type: Established Patient  Setting: Ambulatory outpatient    Location: Office  Delivery: Face-to-face     HPI  Erica Walters, a 57 y.o. year old female, is here today because of her Lumbar disc herniation with radiculopathy [M51.16]. Erica Walters primary complain today is Back Pain and Knee Pain (bilat)  Pertinent problems: Erica Walters has Cervical radiculopathy; DDD (degenerative disc disease), cervical; and Bilateral plantar fasciitis on their pertinent problem list. Pain Assessment: Severity of Chronic pain is reported as a 7 /10. Location: Back Lower/to left groin down inside of left thign to knee. Onset: More than a month ago. Quality: Constant, Other (Comment) ("pain"). Timing: Constant. Modifying factor(s): epidural. Vitals:  height is 5\' 6"  (1.676 m) and weight is 208 lb 4.8 oz (94.5 kg). Her temperature is 96.8 F (36 C) (abnormal). Her blood pressure is 113/80 and her pulse is 91. Her respiration is 16 and oxygen saturation is 99%.  BMI: Estimated body mass index is 33.62 kg/m as calculated from the following:   Height as of this encounter: 5\' 6"  (1.676 m).   Weight as of this encounter: 208 lb 4.8 oz (94.5 kg). Last encounter: 02/11/2023. Last procedure: 03/20/2023.  Reason for encounter: both, medication  management and post-procedure evaluation and assessment.    Post-procedure evaluation   Type: Lumbar epidural steroid injection (LESI) (interlaminar) #2    Laterality: Left   Level:  L3-4 Level.  Imaging: Fluoroscopic guidance         Anesthesia: Local anesthesia (1-2% Lidocaine) DOS: 03/20/2023  Performed by: Edward Jolly, MD  Purpose: Diagnostic/Therapeutic Indications: Lumbar radicular pain of intraspinal etiology of more than 4 weeks that has failed to respond to conservative therapy and is severe enough to impact quality of life or function. 1. Lumbar disc herniation with radiculopathy   2. Lumbar radicular pain   3. Chronic radicular lumbar pain    NAS-11 Pain score:   Pre-procedure: 4 /10   Post-procedure: 0-No pain/10      Effectiveness:  Initial hour after procedure: 100 %  Subsequent 4-6 hours post-procedure: 100 %  Analgesia past initial 6 hours: 65 % (after 3 weeks noticed able to bend and move without as much pain; THEN 04/17/23 pt fell onto right knee and now back is worse pain than before procedure)  Ongoing improvement:  Analgesic: 65% up until her fall on 04/17/2023.  Now pain is back to preprocedure levels.   Pharmacotherapy Assessment  Analgesic: Tramadol 50 mg twice daily as needed  Monitoring:  PMP: PDMP not reviewed this encounter.       Pharmacotherapy: No side-effects or adverse reactions reported. Compliance: No problems identified. Effectiveness: Clinically acceptable.  Nonah Mattes, RN  05/09/2023  2:19 PM  Sign when Signing Visit Nursing Pain Medication Assessment:  Safety precautions to be maintained throughout the outpatient stay will include: orient to surroundings, keep bed in low position, maintain call bell within reach at all  times, provide assistance with transfer out of bed and ambulation.  Medication Inspection Compliance: Erica Walters did not comply with our request to bring her pills to be counted. She was reminded that bringing  the medication bottles, even when empty, is a requirement.  Medication: None brought in. Pill/Patch Count: None available to be counted. Bottle Appearance: No container available. Did not bring bottle(s) to appointment. Filled Date: N/A Last Medication intake:   Pt states she ran out of Tramadol 2-3 weeks ago, but saved 1 and took that today  Nonah Mattes, RN  05/09/2023  2:01 PM  Sign when Signing Visit Safety precautions to be maintained throughout the outpatient stay will include: orient to surroundings, keep bed in low position, maintain call bell within reach at all times, provide assistance with transfer out of bed and ambulation.   No results found for: "CBDTHCR" No results found for: "D8THCCBX" No results found for: "D9THCCBX"  UDS:  Summary  Date Value Ref Range Status  01/22/2022 Note  Final    Comment:    ==================================================================== Compliance Drug Analysis, Ur ==================================================================== Test                             Result       Flag       Units  Drug Present and Declared for Prescription Verification   Tramadol                       >2336        EXPECTED   ng/mg creat   O-Desmethyltramadol            >2336        EXPECTED   ng/mg creat   N-Desmethyltramadol            >2336        EXPECTED   ng/mg creat    Source of tramadol is a prescription medication. O-desmethyltramadol    and N-desmethyltramadol are expected metabolites of tramadol.    Pregabalin                     PRESENT      EXPECTED   Desmethylcyclobenzaprine       PRESENT      EXPECTED    Desmethylcyclobenzaprine is an expected metabolite of    cyclobenzaprine.    Nortriptyline                  PRESENT      EXPECTED    Nortriptyline may be administered as a prescription drug; it is also    an expected metabolite of amitriptyline.  Drug Present not Declared for Prescription Verification   Ibuprofen                       PRESENT      UNEXPECTED ==================================================================== Test                      Result    Flag   Units      Ref Range   Creatinine              214              mg/dL      >=32 ==================================================================== Declared Medications:  The flagging and interpretation on this report are based on the  following declared medications.  Unexpected results may  arise from  inaccuracies in the declared medications.   **Note: The testing scope of this panel includes these medications:   Cyclobenzaprine (Flexeril)  Nortriptyline (Pamelor)  Pregabalin (Lyrica)  Tramadol (Ultram)   **Note: The testing scope of this panel does not include the  following reported medications:   Semaglutide ==================================================================== For clinical consultation, please call [phone]. ====================================================================       ROS  Constitutional: Denies any fever or chills Gastrointestinal: No reported hemesis, hematochezia, vomiting, or acute GI distress Musculoskeletal:  Low back pain, left buttock pain, left SI joint pain Neurological: No reported episodes of acute onset apraxia, aphasia, dysarthria, agnosia, amnesia, paralysis, loss of coordination, or loss of consciousness  Medication Review  Semaglutide-Weight Management, celecoxib, cyclobenzaprine, losartan, nortriptyline, pregabalin, and traMADol  History Review  Allergy: Erica Walters has No Known Allergies. Drug: Erica Walters  reports no history of drug use. Alcohol:  reports current alcohol use of about 3.0 standard drinks of alcohol per week. Tobacco:  reports that she has been smoking cigarettes. She has a 13 pack-year smoking history. She has never used smokeless tobacco. Social: Erica Walters  reports that she has been smoking cigarettes. She has a 13 pack-year smoking history. She has never  used smokeless tobacco. She reports current alcohol use of about 3.0 standard drinks of alcohol per week. She reports that she does not use drugs. Medical:  has a past medical history of Allergy, Arthritis, DJD (degenerative joint disease), and Hyperlipidemia. Surgical: Erica Walters  has a past surgical history that includes Total abdominal hysterectomy w/ bilateral salpingoophorectomy; Colonoscopy (12/04/2017); and Colonoscopy (2020). Family: family history includes Colon cancer in her paternal grandfather; Colon polyps (age of onset: 49) in her paternal grandfather.  Laboratory Chemistry Profile   Renal Lab Results  Component Value Date   BUN 17 02/15/2023   CREATININE 0.66 02/15/2023   BCR SEE NOTE: 02/15/2023   GFRAA 97 01/23/2019   GFRNONAA 84 01/23/2019    Hepatic Lab Results  Component Value Date   AST 32 02/15/2023   ALT 44 (H) 02/15/2023   ALBUMIN 4.1 07/19/2015   ALKPHOS 83 07/19/2015   LIPASE 30 07/19/2015    Electrolytes Lab Results  Component Value Date   NA 141 02/15/2023   K 4.5 02/15/2023   CL 105 02/15/2023   CALCIUM 9.3 02/15/2023    Bone Lab Results  Component Value Date   VD25OH 31 01/23/2019    Inflammation (CRP: Acute Phase) (ESR: Chronic Phase) Lab Results  Component Value Date   CRP 4.7 12/25/2018   ESRSEDRATE 14 12/25/2018         Note: Above Lab results reviewed.  Recent Imaging Review  CT CHEST LUNG CA SCREEN LOW DOSE W/O CM CLINICAL DATA:  Current smoker with 30 pack-year history.  EXAM: CT CHEST WITHOUT CONTRAST LOW-DOSE FOR LUNG CANCER SCREENING  TECHNIQUE: Multidetector CT imaging of the chest was performed following the standard protocol without IV contrast.  RADIATION DOSE REDUCTION: This exam was performed according to the departmental dose-optimization program which includes automated exposure control, adjustment of the mA and/or kV according to patient size and/or use of iterative reconstruction technique.  COMPARISON:   None Available.  FINDINGS: Cardiovascular: Normal heart size. No pericardial effusion. Normal caliber thoracic aorta with mild calcified plaque.  Mediastinum/Nodes: Esophagus and thyroid are unremarkable. No enlarged lymph nodes seen in the chest.  Lungs/Pleura: Central airways are patent. No consolidation, pleural effusion or pneumothorax. Small solid pulmonary nodule of the right upper lobe measuring 4.2  mm on image 42.  Upper Abdomen: No acute abnormality.  Musculoskeletal: No chest wall mass or suspicious bone lesions identified.  IMPRESSION: 1. Lung-RADS 2, benign appearance or behavior. Continue annual screening with low-dose chest CT without contrast in 12 months. 2. Aortic Atherosclerosis (ICD10-I70.0).  Electronically Signed   By: Allegra Lai M.D.   On: 03/26/2023 20:47 Note: Reviewed        Physical Exam  General appearance: Well nourished, well developed, and well hydrated. In no apparent acute distress Mental status: Alert, oriented x 3 (person, place, & time)       Respiratory: No evidence of acute respiratory distress Eyes: PERLA Vitals: BP 113/80   Pulse 91   Temp (!) 96.8 F (36 C)   Resp 16   Ht 5\' 6"  (1.676 m)   Wt 208 lb 4.8 oz (94.5 kg)   SpO2 99%   BMI 33.62 kg/m  BMI: Estimated body mass index is 33.62 kg/m as calculated from the following:   Height as of this encounter: 5\' 6"  (1.676 m).   Weight as of this encounter: 208 lb 4.8 oz (94.5 kg). Ideal: Ideal body weight: 59.3 kg (130 lb 11.7 oz) Adjusted ideal body weight: 73.4 kg (161 lb 12.2 oz)  Lumbar Spine Area Exam  Skin & Axial Inspection: No masses, redness, or swelling Alignment: Symmetrical Functional ROM: Unrestricted ROM       Stability: No instability detected Muscle Tone/Strength: Functionally intact. No obvious neuro-muscular anomalies detected. Sensory (Neurological): Dermatomal pain pattern left L3/4 Palpation: No palpable anomalies       Provocative  Tests: Hyperextension/rotation test: (+) due to pain. Lumbar quadrant test (Kemp's test): (+) on the left for foraminal stenosis Lateral bending test: (+) due to pain. Patrick's Maneuver: (+) for left-sided S-I arthralgia             FABER* test: (+) for left-sided S-I arthralgia  S-I anterior distraction/compression test:(+) for left-sided S-I arthralgia  S-I lateral compression test:(+) for left-sided S-I arthralgia  S-I Thigh-thrust test: (+) for left-sided S-I arthralgia  S-I Gaenslen's test: (+) for left-sided S-I arthralgia  *(Flexion, ABduction and External Rotation) Gait & Posture Assessment  Ambulation: Unassisted Gait: Relatively normal for age and body habitus Posture: WNL  Lower Extremity Exam      Side: Right lower extremity   Side: Left lower extremity  Stability: No instability observed           Stability: No instability observed          Skin & Extremity Inspection: Skin color, temperature, and hair growth are WNL. No peripheral edema or cyanosis. No masses, redness, swelling, asymmetry, or associated skin lesions. No contractures.   Skin & Extremity Inspection: Skin color, temperature, and hair growth are WNL. No peripheral edema or cyanosis. No masses, redness, swelling, asymmetry, or associated skin lesions. No contractures.  Functional ROM: Unrestricted ROM                   Functional ROM: Unrestricted ROM                  Muscle Tone/Strength: Functionally intact. No obvious neuro-muscular anomalies detected.   Muscle Tone/Strength: Functionally intact. No obvious neuro-muscular anomalies detected.  Sensory (Neurological): Unimpaired         Sensory (Neurological): Unimpaired        DTR: Patellar: deferred today Achilles: deferred today Plantar: deferred today   DTR: Patellar: deferred today Achilles: deferred today Plantar: deferred today  Palpation: No  palpable anomalies   Palpation: No palpable anomalies     Assessment   Diagnosis Status  1. Lumbar disc  herniation with radiculopathy   2. Lumbar radicular pain   3. Chronic radicular lumbar pain   4. Chronic left SI joint pain   5. Piriformis syndrome of left side   6. Chronic pain syndrome   7. Cervical radiculopathy   8. Neuropathy involving both lower extremities   9. Back pain, unspecified back location, unspecified back pain laterality, unspecified chronicity   10. Lumbar facet arthropathy   11. Lumbar degenerative disc disease    Having a Flare-up Having a Flare-up Having a Flare-up    Plan of Care   Erica Walters has a current medication list which includes the following long-term medication(s): losartan, nortriptyline, and pregabalin.  Pharmacotherapy (Medications Ordered): Meds ordered this encounter  Medications   traMADol (ULTRAM) 50 MG tablet    Sig: Take 1 tablet (50 mg total) by mouth every 12 (twelve) hours as needed.    Dispense:  60 tablet    Refill:  5   nortriptyline (PAMELOR) 50 MG capsule    Sig: Take 1 capsule (50 mg total) by mouth at bedtime.    Dispense:  30 capsule    Refill:  5   pregabalin (LYRICA) 100 MG capsule    Sig: Take 1 capsule (100 mg total) by mouth 2 (two) times daily.    Dispense:  60 capsule    Refill:  5    Fill one day early if pharmacy is closed on scheduled refill date. May substitute for generic if available.   celecoxib (CELEBREX) 100 MG capsule    Sig: Take 1 capsule (100 mg total) by mouth 2 (two) times daily.    Dispense:  60 capsule    Refill:  5   Orders:  Orders Placed This Encounter  Procedures   SACROILIAC JOINT INJECTION    Standing Status:   Future    Standing Expiration Date:   08/09/2023    Scheduling Instructions:     Side: LEFT SI J    Order Specific Question:   Where will this procedure be performed?    Answer:   [facility]   TRIGGER POINT INJECTION    Area: Buttocks region (gluteal area) Indications: Piriformis muscle pain; Left (G57.02) piriformis-syndrome; piriformis muscle spasms  (G95.621). CPT code: 30865    Scheduling Instructions:     Type: Myoneural block (TPI) of piriformis muscle.     LEFT piriformis TPI    Order Specific Question:   Where will this procedure be performed?    Answer:   [facility]   Lumbar Epidural Injection    Standing Status:   Future    Standing Expiration Date:   08/09/2023    Scheduling Instructions:     Procedure: Interlaminar Lumbar Epidural Steroid injection (LESI)            LEFT L3/4    Order Specific Question:   Where will this procedure be performed?    Answer:   [facility]   Follow-up plan:   Return in about 13 days (around 05/22/2023) for Left L3/4 ESI, Left SI-J and Left Piriformis, in clinic [facility].      Recent Visits Date Type Provider Dept  03/20/23 Procedure visit Edward Jolly, MD [facility]  02/11/23 Office Visit Edward Jolly, MD [facility]  Showing recent visits within past 90 days and meeting all other requirements Today's Visits  Date Type Provider Dept  05/09/23 Office Visit Edward Jolly, MD [facility]  Showing today's visits and meeting all other requirements Future Appointments No visits were found meeting these conditions. Showing future appointments within next 90 days and meeting all other requirements  I discussed the assessment and treatment plan with the patient. The patient was provided an opportunity to ask questions and all were answered. The patient agreed with the plan and demonstrated an understanding of the instructions.  Patient advised to call back or seek an in-person evaluation if the symptoms or condition worsens.  Duration of encounter: .  Total time on encounter, as per AMA guidelines included both the face-to-face and non-face-to-face time personally spent by the physician and/or other qualified health care professional(s) on the day of the encounter (includes time in activities that require the physician or other qualified  health care professional and does not include time in activities normally performed by clinical staff). Physician's time may include the following activities when performed: Preparing to see the patient (e.g., pre-charting review of records, searching for previously ordered imaging, lab work, and nerve conduction tests) Review of prior analgesic pharmacotherapies. Reviewing PMP Interpreting ordered tests (e.g., lab work, imaging, nerve conduction tests) Performing post-procedure evaluations, including interpretation of diagnostic procedures Obtaining and/or reviewing separately obtained history Performing a medically appropriate examination and/or evaluation Counseling and educating the patient/family/caregiver Ordering medications, tests, or procedures Referring and communicating with other health care professionals (when not separately reported) Documenting clinical information in the electronic or other health record Independently interpreting results (not separately reported) and communicating results to the patient/ family/caregiver Care coordination (not separately reported)  Note by: Edward Jolly, MD Date: 05/09/2023; Time: 2:48 PM

## 2023-05-09 NOTE — Patient Instructions (Addendum)
______________________________________________________________________    Preparing for your procedure  Appointments: If you think you may not be able to keep your appointment, call 24-48 hours in advance to cancel. We need time to make it available to others.  During your procedure appointment there will be: No Prescription Refills. No disability issues to discussed. No medication changes or discussions.  Instructions: Food intake: Avoid eating anything solid for at least 8 hours prior to your procedure. Clear liquid intake: You may take clear liquids such as water up to 2 hours prior to your procedure. (No carbonated drinks. No soda.) Transportation: Unless otherwise stated by your physician, bring a driver. (Driver cannot be a Market researcher, Pharmacist, community, or any other form of public transportation.) Morning Medicines: Except for blood thinners, take all of your other morning medications with a sip of water. Make sure to take your heart and blood pressure medicines. If your blood pressure's lower number is above 100, the case will be rescheduled. Blood thinners: Make sure to stop your blood thinners as instructed.  If you take a blood thinner, but were not instructed to stop it, call our office [phone] and ask to talk to a nurse. Not stopping a blood thinner prior to certain procedures could lead to serious complications. Diabetics on insulin: Notify the staff so that you can be scheduled 1st case in the morning. If your diabetes requires high dose insulin, take only  of your normal insulin dose the morning of the procedure and notify the staff that you have done so. Preventing infections: Shower with an antibacterial soap the morning of your procedure.  Build-up your immune system: Take 1000 mg of Vitamin C with every meal (3 times a day) the day prior to your procedure. Antibiotics: Inform the nursing staff if you are taking any antibiotics or if you have any conditions that may require antibiotics  prior to procedures. (Example: recent joint implants)   Pregnancy: If you are pregnant make sure to notify the nursing staff. Not doing so may result in injury to the fetus, including death.  Sickness: If you have a cold, fever, or any active infections, call and cancel or reschedule your procedure. Receiving steroids while having an infection may result in complications. Arrival: You must be in the facility at least 30 minutes prior to your scheduled procedure. Tardiness: Your scheduled time is also the cutoff time. If you do not arrive at least 15 minutes prior to your procedure, you will be rescheduled.  Children: Do not bring any children with you. Make arrangements to keep them home. Dress appropriately: There is always a possibility that your clothing may get soiled. Avoid long dresses. Valuables: Do not bring any jewelry or valuables.  Reasons to call and reschedule or cancel your procedure: (Following these recommendations will minimize the risk of a serious complication.) Surgeries: Avoid having procedures within 2 weeks of any surgery. (Avoid for 2 weeks before or after any surgery). Flu Shots: Avoid having procedures within 2 weeks of a flu shots or . (Avoid for 2 weeks before or after immunizations). Barium: Avoid having a procedure within 7-10 days after having had a radiological study involving the use of radiological contrast. (Myelograms, Barium swallow or enema study). Heart attacks: Avoid any elective procedures or surgeries for the initial 6 months after a "Myocardial Infarction" (Heart Attack). Blood thinners: It is imperative that you stop these medications before procedures. Let us know if you if you take any blood thinner.  Infection: Avoid procedures during or within  two weeks of an infection (including chest colds or gastrointestinal problems). Symptoms associated with infections include: Localized redness, fever, chills, night sweats or profuse sweating, burning sensation  when voiding, cough, congestion, stuffiness, runny nose, sore throat, diarrhea, nausea, vomiting, cold or Flu symptoms, recent or current infections. It is specially important if the infection is over the area that we intend to treat. Heart and lung problems: Symptoms that may suggest an active cardiopulmonary problem include: cough, chest pain, breathing difficulties or shortness of breath, dizziness, ankle swelling, uncontrolled high or unusually low blood pressure, and/or palpitations. If you are experiencing any of these symptoms, cancel your procedure and contact your primary care physician for an evaluation.  Remember:  Regular Business hours are:  Monday to Thursday 8:00 AM to 4:00 PM  Provider's Schedule: Delano Metz, MD:  Procedure days: Tuesday and Thursday 7:30 AM to 4:00 PM  Edward Jolly, MD:  Procedure days: Monday and Wednesday 7:30 AM to 4:00 PM Last  Updated: 02/12/2023 ______________________________________________________________________      ______________________________________________________________________    Medication Rules  Purpose: To inform patients, and their family members, of our medication rules and regulations.  Applies to: All patients receiving prescriptions from our practice (written or electronic).  Pharmacy of record: This is the pharmacy where your electronic prescriptions will be sent. Make sure we have the correct one.  Electronic prescriptions: In compliance with the [facility] Strengthen Opioid Misuse Prevention (STOP) Act of 2017 (Session Conni Elliot [id number]), effective June 25, 2018, all controlled substances must be electronically prescribed. Written prescriptions, faxing, or calling prescriptions to a pharmacy will no longer be done.  Prescription refills: These will be provided only during in-person appointments. No medications will be renewed without a "face-to-face" evaluation with your provider. Applies to all  prescriptions.  NOTE: The following applies primarily to controlled substances (Opioid* Pain Medications).   Type of encounter (visit): For patients receiving controlled substances, face-to-face visits are required. (Not an option and not up to the patient.)  Patient's Responsibilities: Pain Pills: Bring all pain pills to every appointment (except for procedure appointments). Pill counts are required.  Pill Bottles: Bring pills in original pharmacy bottle. Bring bottle, even if empty. Always bring the bottle of the most recent fill.  Medication refills: You are responsible for knowing and keeping track of what medications you are taking and when is it that you will need a refill. The day before your appointment: write a list of all prescriptions that need to be refilled. The day of the appointment: give the list to the admitting nurse. Prescriptions will be written only during appointments. No prescriptions will be written on procedure days. If you forget a medication: it will not be "Called in", "Faxed", or "electronically sent". You will need to get another appointment to get these prescribed. No early refills. Do not call asking to have your prescription filled early. Partial  or short prescriptions: Occasionally your pharmacy may not have enough pills to fill your prescription.  NEVER ACCEPT a partial fill or a prescription that is short of the total amount of pills that you were prescribed.  With controlled substances the law allows 72 hours for the pharmacy to complete the prescription.  If the prescription is not completed within 72 hours, the pharmacist will require a new prescription to be written. This means that you will be short on your medicine and we WILL NOT send another prescription to complete your original prescription.  Instead, request the pharmacy to send a carrier to  a nearby branch to get enough medication to provide you with your full prescription. Prescription Accuracy: You  are responsible for carefully inspecting your prescriptions before leaving our office. Have the discharge nurse carefully go over each prescription with you, before taking them home. Make sure that your name is accurately spelled, that your address is correct. Check the name and dose of your medication to make sure it is accurate. Check the number of pills, and the written instructions to make sure they are clear and accurate. Make sure that you are given enough medication to last until your next medication refill appointment. Taking Medication: Take medication as prescribed. When it comes to controlled substances, taking less pills or less frequently than prescribed is permitted and encouraged. Never take more pills than instructed. Never take the medication more frequently than prescribed.  Inform other Doctors: Always inform, all of your healthcare providers, of all the medications you take. Pain Medication from other Providers: You are not allowed to accept any additional pain medication from any other Doctor or Healthcare provider. There are two exceptions to this rule. (see below) In the event that you require additional pain medication, you are responsible for notifying us, as stated below. Cough Medicine: Often these contain an opioid, such as codeine or hydrocodone. Never accept or take cough medicine containing these opioids if you are already taking an opioid* medication. The combination may cause respiratory failure and death. Medication Agreement: You are responsible for carefully reading and following our Medication Agreement. This must be signed before receiving any prescriptions from our practice. Safely store a copy of your signed Agreement. Violations to the Agreement will result in no further prescriptions. (Additional copies of our Medication Agreement are available upon request.) Laws, Rules, & Regulations: All patients are expected to follow all [address] and [employer], Rules,  [employer]. Ignorance of the Laws does not constitute a valid excuse.  Illegal drugs and Controlled Substances: The use of illegal substances (including, but not limited to marijuana and its derivatives) and/or the illegal use of any controlled substances is strictly prohibited. Violation of this rule may result in the immediate and permanent discontinuation of any and all prescriptions being written by our practice. The use of any illegal substances is prohibited. Adopted CDC guidelines & recommendations: Target dosing levels will be at or below 60 MME/day. Use of benzodiazepines** is not recommended. Urine Drug testing: Patients taking controlled substances will be required to provide a urine sample upon request. Do not void before coming to your medication management appointments. Hold emptying your bladder until you are admitted. The admitting nurse will inform you if a sample is required. Our practice reserves the right to call you at any time to provide a sample. Once receiving the call, you have 24 hours to comply with request. Not providing a sample upon request may result in termination of medication therapy.  Exceptions: There are only two exceptions to the rule of not receiving pain medications from other Healthcare Providers. Exception #1 (Emergencies): In the event of an emergency (i.e.: accident requiring emergency care), you are allowed to receive additional pain medication. However, you are responsible for: As soon as you are able, call our office [phone], at any time of the day or night, and leave a message stating your name, the date and nature of the emergency, and the name and dose of the medication prescribed. In the event that your call is answered by a member of our staff, make sure to  document and save the date, time, and the name of the person that took your information.  Exception #2 (Planned Surgery): In the event that you are scheduled by another doctor or dentist to have  any type of surgery or procedure, you are allowed (for a period no longer than 30 days), to receive additional pain medication, for the acute post-op pain. However, in this case, you are responsible for picking up a copy of our "Post-op Pain Management for Surgeons" handout, and giving it to your surgeon or dentist. This document is available at our office, and does not require an appointment to obtain it. Simply go to our office during business hours (Monday-Thursday from 8:00 AM to 4:00 PM) (Friday 8:00 AM to 12:00 Noon) or if you have a scheduled appointment with [name], prior to your surgery, and ask for it by name. In addition, you are responsible for: calling our office [phone], at any time of the day or night, and leaving a message stating your name, name of your surgeon, type of surgery, and date of procedure or surgery. Failure to comply with your responsibilities may result in termination of therapy involving the controlled substances.  Consequences:  Non-compliance with the above rules may result in permanent discontinuation of medication prescription therapy. All patients receiving any type of controlled substance is expected to comply with the above patient responsibilities. Not doing so may result in permanent discontinuation of medication prescription therapy. Medication Agreement Violation. Following the above rules, including your responsibilities will help you in avoiding a Medication Agreement Violation ("Breaking your Pain Medication Contract").  *Opioid medications include: morphine, codeine, oxycodone, oxymorphone, hydrocodone, hydromorphone, meperidine, tramadol, tapentadol, buprenorphine, fentanyl, methadone. **Benzodiazepine medications include: diazepam (Valium), alprazolam (Xanax), clonazepam (Klonopine), lorazepam (Ativan), clorazepate (Tranxene), chlordiazepoxide (Librium), estazolam (Prosom), oxazepam (Serax), temazepam (Restoril), triazolam (Halcion) (Last updated:  04/17/2023) ______________________________________________________________________

## 2023-05-17 ENCOUNTER — Encounter: Payer: Self-pay | Admitting: Family Medicine

## 2023-05-20 ENCOUNTER — Other Ambulatory Visit: Payer: Self-pay

## 2023-05-20 DIAGNOSIS — E669 Obesity, unspecified: Secondary | ICD-10-CM

## 2023-05-20 MED ORDER — WEGOVY 2.4 MG/0.75ML ~~LOC~~ SOAJ: 2.4000 mg | SUBCUTANEOUS | 1 refills | Status: DC

## 2023-05-27 ENCOUNTER — Encounter: Payer: Self-pay | Admitting: Student in an Organized Health Care Education/Training Program

## 2023-05-27 ENCOUNTER — Ambulatory Visit
Admission: RE | Admit: 2023-05-27 | Discharge: 2023-05-27 | Disposition: A | Discharge status: Home / Self Care | Payer: 59 | Source: Ambulatory Visit | Attending: Student in an Organized Health Care Education/Training Program | Admitting: Student in an Organized Health Care Education/Training Program

## 2023-05-27 ENCOUNTER — Ambulatory Visit
Payer: 59 | Attending: Student in an Organized Health Care Education/Training Program | Admitting: Student in an Organized Health Care Education/Training Program

## 2023-05-27 VITALS — BP 133/88 | HR 95 | Temp 97.1°F | Resp 18 | Ht 66.0 in | Wt 207.0 lb

## 2023-05-27 DIAGNOSIS — G5702 Lesion of sciatic nerve, left lower limb: Secondary | ICD-10-CM | POA: Diagnosis not present

## 2023-05-27 DIAGNOSIS — G8929 Other chronic pain: Secondary | ICD-10-CM

## 2023-05-27 DIAGNOSIS — M5116 Intervertebral disc disorders with radiculopathy, lumbar region: Secondary | ICD-10-CM | POA: Diagnosis not present

## 2023-05-27 DIAGNOSIS — M2550 Pain in unspecified joint: Secondary | ICD-10-CM | POA: Diagnosis not present

## 2023-05-27 DIAGNOSIS — M533 Sacrococcygeal disorders, not elsewhere classified: Secondary | ICD-10-CM | POA: Diagnosis not present

## 2023-05-27 DIAGNOSIS — M5416 Radiculopathy, lumbar region: Secondary | ICD-10-CM

## 2023-05-27 DIAGNOSIS — G894 Chronic pain syndrome: Secondary | ICD-10-CM | POA: Insufficient documentation

## 2023-05-27 MED ORDER — DEXAMETHASONE SODIUM PHOSPHATE 10 MG/ML IJ SOLN
10.0000 mg | Freq: Once | INTRAMUSCULAR | Status: AC
  Administered 2023-05-27: 10 mg
  Filled 2023-05-27: qty 1

## 2023-05-27 MED ORDER — SODIUM CHLORIDE 0.9% FLUSH
2.0000 mL | Freq: Once | INTRAVENOUS | Status: AC
  Administered 2023-05-27: 10 mL

## 2023-05-27 MED ORDER — ROPIVACAINE HCL 2 MG/ML IJ SOLN
2.0000 mL | Freq: Once | INTRAMUSCULAR | Status: AC
  Administered 2023-05-27: 20 mL via EPIDURAL
  Filled 2023-05-27: qty 20

## 2023-05-27 MED ORDER — LIDOCAINE HCL 2 % IJ SOLN
20.0000 mL | Freq: Once | INTRAMUSCULAR | Status: AC
  Administered 2023-05-27: 400 mg
  Filled 2023-05-27: qty 20

## 2023-05-27 MED ORDER — METHYLPREDNISOLONE ACETATE 40 MG/ML IJ SUSP
40.0000 mg | Freq: Once | INTRAMUSCULAR | Status: AC
  Administered 2023-05-27: 40 mg via INTRA_ARTICULAR
  Filled 2023-05-27: qty 1

## 2023-05-27 MED ORDER — IOHEXOL 180 MG/ML  SOLN
10.0000 mL | Freq: Once | INTRAMUSCULAR | Status: AC
  Administered 2023-05-27: 10 mL via EPIDURAL
  Filled 2023-05-27: qty 20

## 2023-05-27 MED ORDER — SODIUM CHLORIDE (PF) 0.9 % IJ SOLN
INTRAMUSCULAR | Status: AC
  Filled 2023-05-27: qty 10

## 2023-05-27 MED ORDER — ROPIVACAINE HCL 2 MG/ML IJ SOLN
9.0000 mL | Freq: Once | INTRAMUSCULAR | Status: DC
  Filled 2023-05-27: qty 20

## 2023-05-27 NOTE — Progress Notes (Signed)
PROVIDER NOTE: Interpretation of information contained herein should be left to medically-trained personnel. Specific patient instructions are provided elsewhere under "Patient Instructions" section of medical record. This document was created in part using STT-dictation technology, any transcriptional errors that may result from this process are unintentional.  Patient: Erica Walters Type: Established DOB: 03/30/1966 MRN: [id number] PCP: Donita Brooks, MD  Service: Procedure DOS: 05/27/2023 Setting: Ambulatory Location: Ambulatory outpatient facility Delivery: Face-to-face Provider: Edward Jolly, MD Specialty: Interventional Pain Management Specialty designation: 09 Location: Outpatient facility Ref. Prov.: Edward Jolly, MD    Primary Reason for Visit: Interventional Pain Management Treatment. CC: Back Pain (Lumbar left )   Procedure:           Type: Lumbar epidural steroid injection (LESI) (interlaminar) #3    Laterality: Left   Level:  L3-4 Level.  Imaging: Fluoroscopic guidance         Anesthesia: Local anesthesia (1-2% Lidocaine) DOS: 05/27/2023  Performed by: Edward Jolly, MD  Purpose: Diagnostic/Therapeutic Indications: Lumbar radicular pain of intraspinal etiology of more than 4 weeks that has failed to respond to conservative therapy and is severe enough to impact quality of life or function. 1. Lumbar disc herniation with radiculopathy   2. Lumbar radicular pain   3. Chronic radicular lumbar pain   4. Chronic left SI joint pain   5. Piriformis syndrome of left side   6. Chronic pain syndrome   7. SI (sacroiliac) joint dysfunction    NAS-11 Pain score:   Pre-procedure: 3 /10   Post-procedure: 0-No pain/10      Position / Prep / Materials:  Position: Prone w/ head of the table raised (slight reverse trendelenburg) to facilitate breathing.  Prep solution: DuraPrep (Iodine Povacrylex [0.7% available iodine] and Isopropyl Alcohol, 74% w/w) Prep Area: Entire  Posterior Lumbar Region from lower scapular tip down to mid buttocks area and from flank to flank. Materials:  Tray: Epidural tray Needle(s):  Type: Epidural needle (Tuohy) Gauge (G):  22 Length: Regular (3.5-in) Qty: 1  Pre-op H&P Assessment:  Erica Walters is a 57 y.o. (year old), female patient, seen today for interventional treatment. She  has a past surgical history that includes Total abdominal hysterectomy w/ bilateral salpingoophorectomy; Colonoscopy (12/04/2017); and Colonoscopy (2020). Erica Walters has a current medication list which includes the following prescription(s): celecoxib, cyclobenzaprine, diclofenac, losartan, nortriptyline, pregabalin, wegovy, tramadol, and wegovy, and the following Facility-Administered Medications: ropivacaine (pf) 2 mg/ml (0.2%). Her primarily concern today is the Back Pain (Lumbar left )  Initial Vital Signs:  Pulse/HCG Rate: 95ECG Heart Rate: 87 Temp:  (!) 97.1 F (36.2 C) Resp: 16 BP: 130/87 SpO2: 97 %  BMI: Estimated body mass index is 33.41 kg/m as calculated from the following:   Height as of this encounter: 5\' 6"  (1.676 m).   Weight as of this encounter: 207 lb (93.9 kg).  Risk Assessment: Allergies: Reviewed. She has No Known Allergies.  Allergy Precautions: None required Coagulopathies: Reviewed. None identified.  Blood-thinner therapy: None at this time Active Infection(s): Reviewed. None identified. Erica Walters is afebrile  Site Confirmation: Erica Walters was asked to confirm the procedure and laterality before marking the site Procedure checklist: Completed Consent: Before the procedure and under the influence of no sedative(s), amnesic(s), or anxiolytics, the patient was informed of the treatment options, risks and possible complications. To fulfill our ethical and legal obligations, as recommended by the American Medical Association's Code of Ethics, I have informed the patient of my clinical impression; the nature and  purpose of the treatment or procedure; the risks, benefits, and possible complications of the intervention; the alternatives, including doing nothing; the risk(s) and benefit(s) of the alternative treatment(s) or procedure(s); and the risk(s) and benefit(s) of doing nothing. The patient was provided information about the general risks and possible complications associated with the procedure. These may include, but are not limited to: failure to achieve desired goals, infection, bleeding, organ or nerve damage, allergic reactions, paralysis, and death. In addition, the patient was informed of those risks and complications associated to Spine-related procedures, such as failure to decrease pain; infection (i.e.: Meningitis, epidural or intraspinal abscess); bleeding (i.e.: epidural hematoma, subarachnoid hemorrhage, or any other type of intraspinal or peri-dural bleeding); organ or nerve damage (i.e.: Any type of peripheral nerve, nerve root, or spinal cord injury) with subsequent damage to sensory, motor, and/or autonomic systems, resulting in permanent pain, numbness, and/or weakness of one or several areas of the body; allergic reactions; (i.e.: anaphylactic reaction); and/or death. Furthermore, the patient was informed of those risks and complications associated with the medications. These include, but are not limited to: allergic reactions (i.e.: anaphylactic or anaphylactoid reaction(s)); adrenal axis suppression; blood sugar elevation that in diabetics may result in ketoacidosis or comma; water retention that in patients with history of congestive heart failure may result in shortness of breath, pulmonary edema, and decompensation with resultant heart failure; weight gain; swelling or edema; medication-induced neural toxicity; particulate matter embolism and blood vessel occlusion with resultant organ, and/or nervous system infarction; and/or aseptic necrosis of one or more joints. Finally, the patient was  informed that Medicine is not an exact science; therefore, there is also the possibility of unforeseen or unpredictable risks and/or possible complications that may result in a catastrophic outcome. The patient indicated having understood very clearly. We have given the patient no guarantees and we have made no promises. Enough time was given to the patient to ask questions, all of which were answered to the patient's satisfaction. Erica Walters has indicated that she wanted to continue with the procedure. Attestation: I, the ordering provider, attest that I have discussed with the patient the benefits, risks, side-effects, alternatives, likelihood of achieving goals, and potential problems during recovery for the procedure that I have provided informed consent. Date  Time: 05/27/2023 10:27 AM  Pre-Procedure Preparation:  Monitoring: As per clinic protocol. Respiration, ETCO2, SpO2, BP, heart rate and rhythm monitor placed and checked for adequate function Safety Precautions: Patient was assessed for positional comfort and pressure points before starting the procedure. Time-out: I initiated and conducted the "Time-out" before starting the procedure, as per protocol. The patient was asked to participate by confirming the accuracy of the "Time Out" information. Verification of the correct person, site, and procedure were performed and confirmed by me, the nursing staff, and the patient. "Time-out" conducted as per Joint Commission's Universal Protocol (UP.01.01.01). Time: 1155  Description/Narrative of Procedure:          Target: Epidural space via interlaminar opening, initially targeting the lower laminar border of the superior vertebral body. Region: Lumbar Approach: Percutaneous paravertebral  Rationale (medical necessity): procedure needed and proper for the diagnosis and/or treatment of the patient's medical symptoms and needs. Procedural Technique Safety Precautions: Aspiration looking for blood  return was conducted prior to all injections. At no point did we inject any substances, as a needle was being advanced. No attempts were made at seeking any paresthesias. Safe injection practices and needle disposal techniques used. Medications properly checked for expiration dates. SDV (  single dose vial) medications used. Description of the Procedure: Protocol guidelines were followed. The procedure needle was introduced through the skin, ipsilateral to the reported pain, and advanced to the target area. Bone was contacted and the needle walked caudad, until the lamina was cleared. The epidural space was identified using "loss-of-resistance technique" with 2-3 ml of PF-NaCl (0.9% NSS), in a 5cc LOR glass syringe.   6 cc solution made of 3 cc of preservative-free saline, 2 cc of 0.2% ropivacaine, 1 cc of Decadron 10 mg/cc.   Vitals:   05/27/23 1101 05/27/23 1159 05/27/23 1203  BP: 130/87 (!) 128/100 133/88  Pulse: 95    Resp: 16 20 18   Temp: (!) 97.1 F (36.2 C)    TempSrc: Temporal    SpO2: 97% 93% 95%  Weight: 207 lb (93.9 kg)    Height: 5\' 6"  (1.676 m)      Start Time: 1155 hrs. End Time: 1204 hrs.  Imaging Guidance (Spinal):          Type of Imaging Technique: Fluoroscopy Guidance (Spinal) Indication(s): Assistance in needle guidance and placement for procedures requiring needle placement in or near specific anatomical locations not easily accessible without such assistance. Exposure Time: Please see nurses notes. Contrast: Before injecting any contrast, we confirmed that the patient did not have an allergy to iodine, shellfish, or radiological contrast. Once satisfactory needle placement was completed at the desired level, radiological contrast was injected. Contrast injected under live fluoroscopy. No contrast complications. See chart for type and volume of contrast used. Fluoroscopic Guidance: I was personally present during the use of fluoroscopy. "Tunnel Vision Technique" used to  obtain the best possible view of the target area. Parallax error corrected before commencing the procedure. "Direction-depth-direction" technique used to introduce the needle under continuous pulsed fluoroscopy. Once target was reached, antero-posterior, oblique, and lateral fluoroscopic projection used confirm needle placement in all planes. Images permanently stored in EMR. Interpretation: I personally interpreted the imaging intraoperatively. Adequate needle placement confirmed in multiple planes. Appropriate spread of contrast into desired area was observed. No evidence of afferent or efferent intravascular uptake. No intrathecal or subarachnoid spread observed. Permanent images saved into the patient's record.   Post-operative Assessment:  Post-procedure Vital Signs:  Pulse/HCG Rate: 9586 Temp:  (!) 97.1 F (36.2 C) Resp: 18 BP:  133/88 SpO2: 95 %  EBL: None  Complications: No immediate post-treatment complications observed by team, or reported by patient.  Note: The patient tolerated the entire procedure well. A repeat set of vitals were taken after the procedure and the patient was kept under observation following institutional policy, for this type of procedure. Post-procedural neurological assessment was performed, showing return to baseline, prior to discharge. The patient was provided with post-procedure discharge instructions, including a section on how to identify potential problems. Should any problems arise concerning this procedure, the patient was given instructions to immediately contact us, at any time, without hesitation. In any case, we plan to contact the patient by telephone for a follow-up status report regarding this interventional procedure.  Comments:  No additional relevant information.  Plan of Care  Orders:  Orders Placed This Encounter  Procedures   DG PAIN CLIN[facility] C-ARM 1-60 MIN NO REPORT    Intraoperative interpretation by procedural physician at [facility].    Standing Status:   Standing    Number of Occurrences:   1    Order Specific Question:   Reason for exam:    Answer:   Assistance in  needle guidance and placement for procedures requiring needle placement in or near specific anatomical locations not easily accessible without such assistance.   5 out of 5 strength bilateral lower extremity: Plantar flexion, dorsiflexion, knee flexion, knee extension.   Medications ordered for procedure: Meds ordered this encounter  Medications   iohexol (OMNIPAQUE) 180 MG/ML injection 10 mL    Must be Myelogram-compatible. If not available, you may substitute with a water-soluble, non-ionic, hypoallergenic, myelogram-compatible radiological contrast medium.   lidocaine (XYLOCAINE) 2 % (with pres) injection 400 mg   ropivacaine (PF) 2 mg/mL (0.2%) (NAROPIN) injection 2 mL   sodium chloride flush (NS) 0.9 % injection 2 mL   dexamethasone (DECADRON) injection 10 mg   ropivacaine (PF) 2 mg/mL (0.2%) (NAROPIN) injection 9 mL   methylPREDNISolone acetate (DEPO-MEDROL) injection 40 mg   Medications administered: We administered iohexol, lidocaine, ropivacaine (PF) 2 mg/mL (0.2%), sodium chloride flush, dexamethasone, and methylPREDNISolone acetate.  See the medical record for exact dosing, route, and time of administration.  Follow-up plan:   Return in about 8 weeks (around 07/22/2023) for PPE, VV.     Recent Visits Date Type Provider Dept  05/09/23 Office Visit Edward Jolly, MD [facility]  03/20/23 Procedure visit Edward Jolly, MD [facility]  Showing recent visits within past 90 days and meeting all other requirements Today's Visits Date Type Provider Dept  05/27/23 Procedure visit Edward Jolly, MD [facility]  Showing today's visits and meeting all other requirements Future Appointments Date Type Provider Dept  07/22/23 Appointment Edward Jolly, MD [facility]  Showing future appointments  within next 90 days and meeting all other requirements  Disposition: Discharge home  Discharge (Date  Time): 05/27/2023; 1210 hrs.   Primary Care Physician: Donita Brooks, MD Location: [facility] Note by: Edward Jolly, MD Date: 05/27/2023; Time: 12:22 PM  Disclaimer:  Medicine is not an exact science. The only guarantee in medicine is that nothing is guaranteed. It is important to note that the decision to proceed with this intervention was based on the information collected from the patient. The Data and conclusions were drawn from the patient's questionnaire, the interview, and the physical examination. Because the information was provided in large part by the patient, it cannot be guaranteed that it has not been purposely or unconsciously manipulated. Every effort has been made to obtain as much relevant data as possible for this evaluation. It is important to note that the conclusions that lead to this procedure are derived in large part from the available data. Always take into account that the treatment will also be dependent on availability of resources and existing treatment guidelines, considered by other Pain Management Practitioners as being common knowledge and practice, at the time of the intervention. For Medico-Legal purposes, it is also important to point out that variation in procedural techniques and pharmacological choices are the acceptable norm. The indications, contraindications, technique, and results of the above procedure should only be interpreted and judged by a Board-Certified Interventional Pain Specialist with extensive familiarity and expertise in the same exact procedure and technique.

## 2023-05-27 NOTE — Progress Notes (Signed)
Safety precautions to be maintained throughout the outpatient stay will include: orient to surroundings, keep bed in low position, maintain call bell within reach at all times, provide assistance with transfer out of bed and ambulation.  

## 2023-05-27 NOTE — Progress Notes (Signed)
PROVIDER NOTE: Interpretation of information contained herein should be left to medically-trained personnel. Specific patient instructions are provided elsewhere under "Patient Instructions" section of medical record. This document was created in part using STT-dictation technology, any transcriptional errors that may result from this process are unintentional.  Patient: Erica Walters Type: Established DOB: December 02, 1965 MRN: [id number] PCP: Donita Brooks, MD  Service: Procedure DOS: 05/27/2023 Setting: Ambulatory Location: Ambulatory outpatient facility Delivery: Face-to-face Provider: Edward Jolly, MD Specialty: Interventional Pain Management Specialty designation: 09 Location: Outpatient facility Ref. Prov.: Edward Jolly, MD       Interventional Therapy   Procedure: Sacroiliac Joint Steroid Injection #2  & Left Piriformis TPI #2 Laterality: Left     Level: PIIS (Posterior Inferior Iliac Spine)  Imaging: Fluoroscopic guidance Anesthesia: Local anesthesia (1-2% Lidocaine) DOS: 05/27/2023  Performed by: Edward Jolly, MD  Purpose: Diagnostic/Therapeutic Indications: Sacroiliac joint pain in the lower back and hip area severe enough to impact quality of life or function. Rationale (medical necessity): procedure needed and proper for the diagnosis and/or treatment of Erica Walters's medical symptoms and needs. Left SI joint dysfunction, left piriformis syndrome   NAS-11 Pain score:   Pre-procedure: 3 /10   Post-procedure: 0-No pain/10     Target: Interarticular sacroiliac joint. Location: Medial to the postero-medial edge of iliac spine. Region: Lumbosacral-sacrococcygeal. Approach: Inferior postero-medial percutaneous approach. Type of procedure: Percutaneous joint injection.  Position / Prep / Materials:  Position: Prone  Prep solution: DuraPrep (Iodine Povacrylex [0.7% available iodine] and Isopropyl Alcohol, 74% w/w) Prep Area: Entire posterior lumbosacral area   Materials:  Tray: Block Needle(s):  Type: Spinal  Gauge (G): 22  Length: 3.5-in Qty: 1  H&P (Pre-op Assessment):  Erica Walters is a 57 y.o. (year old), female patient, seen today for interventional treatment. She  has a past surgical history that includes Total abdominal hysterectomy w/ bilateral salpingoophorectomy; Colonoscopy (12/04/2017); and Colonoscopy (2020). Erica Walters has a current medication list which includes the following prescription(s): celecoxib, cyclobenzaprine, diclofenac, losartan, nortriptyline, pregabalin, wegovy, tramadol, and wegovy, and the following Facility-Administered Medications: ropivacaine (pf) 2 mg/ml (0.2%). Her primarily concern today is the Back Pain (Lumbar left )  Initial Vital Signs:  Pulse/HCG Rate: 95ECG Heart Rate: 87 Temp: (!) 97.1 F (36.2 C) Resp: 16 BP: 130/87 SpO2: 97 %  BMI: Estimated body mass index is 33.41 kg/m as calculated from the following:   Height as of this encounter: 5\' 6"  (1.676 m).   Weight as of this encounter: 207 lb (93.9 kg).  Risk Assessment: Allergies: Reviewed. She has No Known Allergies.  Allergy Precautions: None required Coagulopathies: Reviewed. None identified.  Blood-thinner therapy: None at this time Active Infection(s): Reviewed. None identified. Erica Walters is afebrile  Site Confirmation: Erica Walters was asked to confirm the procedure and laterality before marking the site Procedure checklist: Completed Consent: Before the procedure and under the influence of no sedative(s), amnesic(s), or anxiolytics, the patient was informed of the treatment options, risks and possible complications. To fulfill our ethical and legal obligations, as recommended by the American Medical Association's Code of Ethics, I have informed the patient of my clinical impression; the nature and purpose of the treatment or procedure; the risks, benefits, and possible complications of the intervention; the alternatives, including  doing nothing; the risk(s) and benefit(s) of the alternative treatment(s) or procedure(s); and the risk(s) and benefit(s) of doing nothing. The patient was provided information about the general risks and possible complications associated with the procedure. These may include, but are not  limited to: failure to achieve desired goals, infection, bleeding, organ or nerve damage, allergic reactions, paralysis, and death. In addition, the patient was informed of those risks and complications associated to the procedure, such as failure to decrease pain; infection; bleeding; organ or nerve damage with subsequent damage to sensory, motor, and/or autonomic systems, resulting in permanent pain, numbness, and/or weakness of one or several areas of the body; allergic reactions; (i.e.: anaphylactic reaction); and/or death. Furthermore, the patient was informed of those risks and complications associated with the medications. These include, but are not limited to: allergic reactions (i.e.: anaphylactic or anaphylactoid reaction(s)); adrenal axis suppression; blood sugar elevation that in diabetics may result in ketoacidosis or comma; water retention that in patients with history of congestive heart failure may result in shortness of breath, pulmonary edema, and decompensation with resultant heart failure; weight gain; swelling or edema; medication-induced neural toxicity; particulate matter embolism and blood vessel occlusion with resultant organ, and/or nervous system infarction; and/or aseptic necrosis of one or more joints. Finally, the patient was informed that Medicine is not an exact science; therefore, there is also the possibility of unforeseen or unpredictable risks and/or possible complications that may result in a catastrophic outcome. The patient indicated having understood very clearly. We have given the patient no guarantees and we have made no promises. Enough time was given to the patient to ask questions,  all of which were answered to the patient's satisfaction. Erica Walters has indicated that she wanted to continue with the procedure. Attestation: I, the ordering provider, attest that I have discussed with the patient the benefits, risks, side-effects, alternatives, likelihood of achieving goals, and potential problems during recovery for the procedure that I have provided informed consent. Date  Time: 05/27/2023 10:27 AM  Pre-Procedure Preparation:  Monitoring: As per clinic protocol. Respiration, ETCO2, SpO2, BP, heart rate and rhythm monitor placed and checked for adequate function Safety Precautions: Patient was assessed for positional comfort and pressure points before starting the procedure. Time-out: I initiated and conducted the "Time-out" before starting the procedure, as per protocol. The patient was asked to participate by confirming the accuracy of the "Time Out" information. Verification of the correct person, site, and procedure were performed and confirmed by me, the nursing staff, and the patient. "Time-out" conducted as per Joint Commission's Universal Protocol (UP.01.01.01). Time: 1155 Start Time: 1155 hrs.  Description/Narrative of Procedure:          Start Time: 1155 hrs.  Rationale (medical necessity): procedure needed and proper for the diagnosis and/or treatment of the patient's medical symptoms and needs. Procedural Technique Safety Precautions: Aspiration looking for blood return was conducted prior to all injections. At no point did we inject any substances, as a needle was being advanced. No attempts were made at seeking any paresthesias. Safe injection practices and needle disposal techniques used. Medications properly checked for expiration dates. SDV (single dose vial) medications used. Description of the Procedure: Protocol guidelines were followed. The patient was assisted into a comfortable position. The target area was identified and the area prepped in the usual  manner. Skin & deeper tissues infiltrated with local anesthetic. Appropriate amount of time allowed to pass for local anesthetics to take effect. The procedure needles were then advanced to the target area. Proper needle placement secured. Negative aspiration confirmed. Solution injected in intermittent fashion, asking for systemic symptoms every 0.5cc of injectate. The needles were then removed and the area cleansed, making sure to leave some of the prepping solution back to take  advantage of its long term bactericidal properties.  Technical description of procedure:  Fluoroscopy using a posterior anterior 45 degree angle from the midline aiming at the anterolateral aspect of the patient was used to find a direct path into the sacroiliac joint, the superior medial to posterior superior iliac spine.  The skin was marked where the desired target and the skin infiltrated with local anesthetics.  The procedure needle was then advanced until the joint was entered.  Once inside of the joint, we then proceeded to inject the desired solution. 5 cc solution made of 4.5 cc of 0.2% ropivacaine, 0.5 cc of methylprednisolone, 40 mg/cc.  Injected into the left SI joint after contrast confirmation   LEFT piriformis trigger point injection was done 1 cm inferior, 1 cm deep, 1 cm lateral to the inferior fissure of the SI joint.  Contrast was injected to confirm piriformis muscle striation.  5 cc solution made of 4.5 cc of 0.2% ropivacaine, 0.5 cc of methylprednisolone (40 mg/cc.)  was injected.  While injecting, patient did not complain of any pain radiating down her left leg.   Vitals:   05/27/23 1101 05/27/23 1159 05/27/23 1203  BP: 130/87 (!) 128/100 133/88  Pulse: 95    Resp: 16 20 18   Temp: (!) 97.1 F (36.2 C)    TempSrc: Temporal    SpO2: 97% 93% 95%  Weight: 207 lb (93.9 kg)    Height: 5\' 6"  (1.676 m)       End Time: 1204 hrs.  Imaging Guidance (Non-Spinal):          Type of Imaging Technique:  Fluoroscopy Guidance (Non-Spinal) Indication(s): Assistance in needle guidance and placement for procedures requiring needle placement in or near specific anatomical locations not easily accessible without such assistance. Exposure Time: Please see nurses notes. Contrast: Before injecting any contrast, we confirmed that the patient did not have an allergy to iodine, shellfish, or radiological contrast. Once satisfactory needle placement was completed at the desired level, radiological contrast was injected. Contrast injected under live fluoroscopy. No contrast complications. See chart for type and volume of contrast used. Fluoroscopic Guidance: I was personally present during the use of fluoroscopy. "Tunnel Vision Technique" used to obtain the best possible view of the target area. Parallax error corrected before commencing the procedure. "Direction-depth-direction" technique used to introduce the needle under continuous pulsed fluoroscopy. Once target was reached, antero-posterior, oblique, and lateral fluoroscopic projection used confirm needle placement in all planes. Images permanently stored in EMR. Interpretation: I personally interpreted the imaging intraoperatively. Adequate needle placement confirmed in multiple planes. Appropriate spread of contrast into desired area was observed. No evidence of afferent or efferent intravascular uptake. Permanent images saved into the patient's record.  Post-operative Assessment:  Post-procedure Vital Signs:  Pulse/HCG Rate: 9586 Temp: (!) 97.1 F (36.2 C) Resp: 18 BP: 133/88 SpO2: 95 %  EBL: None  Complications: No immediate post-treatment complications observed by team, or reported by patient.  Note: The patient tolerated the entire procedure well. A repeat set of vitals were taken after the procedure and the patient was kept under observation following institutional policy, for this type of procedure. Post-procedural neurological assessment was  performed, showing return to baseline, prior to discharge. The patient was provided with post-procedure discharge instructions, including a section on how to identify potential problems. Should any problems arise concerning this procedure, the patient was given instructions to immediately contact us, at any time, without hesitation. In any case, we plan to contact the patient by  telephone for a follow-up status report regarding this interventional procedure.  Comments:  No additional relevant information.  Plan of Care (POC)  Orders:  Orders Placed This Encounter  Procedures   DG PAIN CLINIC C-ARM 1-60 MIN NO REPORT    Intraoperative interpretation by procedural physician at [facility].    Standing Status:   Standing    Number of Occurrences:   1    Order Specific Question:   Reason for exam:    Answer:   Assistance in needle guidance and placement for procedures requiring needle placement in or near specific anatomical locations not easily accessible without such assistance.   Medications ordered for procedure: Refill of tramadol as well.  PMP checked and reviewed. Meds ordered this encounter  Medications   iohexol (OMNIPAQUE) 180 MG/ML injection 10 mL    Must be Myelogram-compatible. If not available, you may substitute with a water-soluble, non-ionic, hypoallergenic, myelogram-compatible radiological contrast medium.   lidocaine (XYLOCAINE) 2 % (with pres) injection 400 mg   ropivacaine (PF) 2 mg/mL (0.2%) (NAROPIN) injection 2 mL   sodium chloride flush (NS) 0.9 % injection 2 mL   dexamethasone (DECADRON) injection 10 mg   ropivacaine (PF) 2 mg/mL (0.2%) (NAROPIN) injection 9 mL   methylPREDNISolone acetate (DEPO-MEDROL) injection 40 mg   Medications administered: We administered iohexol, lidocaine, ropivacaine (PF) 2 mg/mL (0.2%), sodium chloride flush, dexamethasone, and methylPREDNISolone acetate.  See the medical record for exact dosing, route, and time of  administration.  Follow-up plan:   Return in about 8 weeks (around 07/22/2023) for PPE, VV.       Recent Visits Date Type Provider Dept  05/09/23 Office Visit Edward Jolly, MD [facility]  03/20/23 Procedure visit Edward Jolly, MD [facility]  Showing recent visits within past 90 days and meeting all other requirements Today's Visits Date Type Provider Dept  05/27/23 Procedure visit Edward Jolly, MD [facility]  Showing today's visits and meeting all other requirements Future Appointments Date Type Provider Dept  07/22/23 Appointment Edward Jolly, MD [facility]  Showing future appointments within next 90 days and meeting all other requirements  Disposition: Discharge home  Discharge (Date  Time): 05/27/2023; 1210 hrs.   Primary Care Physician: Donita Brooks, MD Location: [facility] Note by: Edward Jolly, MD (TTS technology used. I apologize for any typographical errors that were not detected and corrected.) Date: 05/27/2023; Time: 12:22 PM  Disclaimer:  Medicine is not an Visual merchandiser. The only guarantee in medicine is that nothing is guaranteed. It is important to note that the decision to proceed with this intervention was based on the information collected from the patient. The Data and conclusions were drawn from the patient's questionnaire, the interview, and the physical examination. Because the information was provided in large part by the patient, it cannot be guaranteed that it has not been purposely or unconsciously manipulated. Every effort has been made to obtain as much relevant data as possible for this evaluation. It is important to note that the conclusions that lead to this procedure are derived in large part from the available data. Always take into account that the treatment will also be dependent on availability of resources and existing treatment guidelines, considered by other Pain  Management Practitioners as being common knowledge and practice, at the time of the intervention. For Medico-Legal purposes, it is also important to point out that variation in procedural techniques and pharmacological choices are the acceptable norm. The  indications, contraindications, technique, and results of the above procedure should only be interpreted and judged by a Board-Certified Interventional Pain Specialist with extensive familiarity and expertise in the same exact procedure and technique.

## 2023-05-27 NOTE — Patient Instructions (Signed)

## 2023-05-28 ENCOUNTER — Telehealth: Payer: Self-pay | Admitting: *Deleted

## 2023-05-28 NOTE — Telephone Encounter (Signed)
No problems post procedure. 

## 2023-07-03 ENCOUNTER — Encounter: Payer: Self-pay | Admitting: Family Medicine

## 2023-07-05 IMAGING — CR DG LUMBAR SPINE COMPLETE W/ BEND
7 series · 7 of 7 positions shown · non-contrast
Comparison: Lumbar MRI 04/11/2019

CLINICAL DATA: Chronic low back pain for years

EXAM:
LUMBAR SPINE - COMPLETE WITH BENDING VIEWS

[l-spine ap]
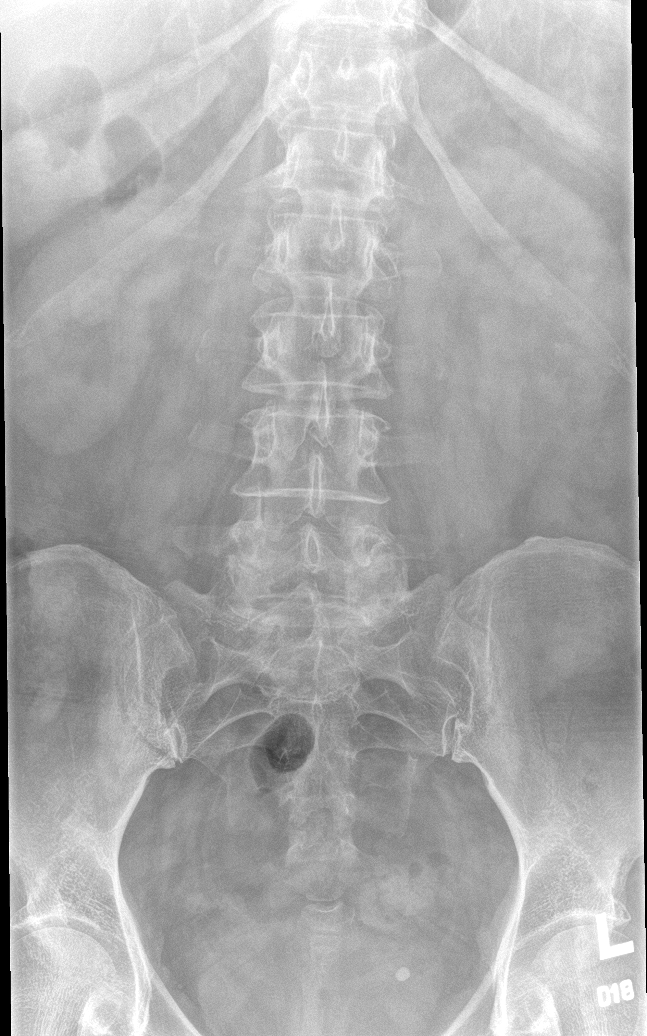

[l-spine obl (1 of 2)]
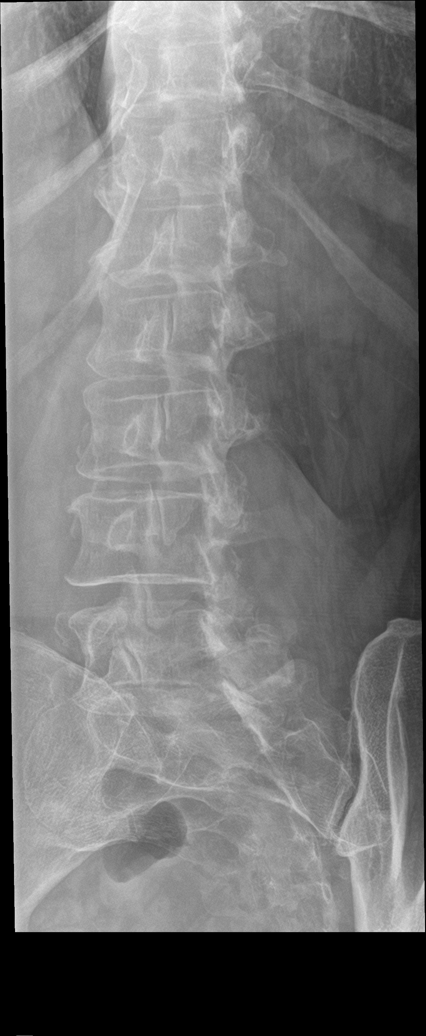

[l-spine obl (2 of 2)]
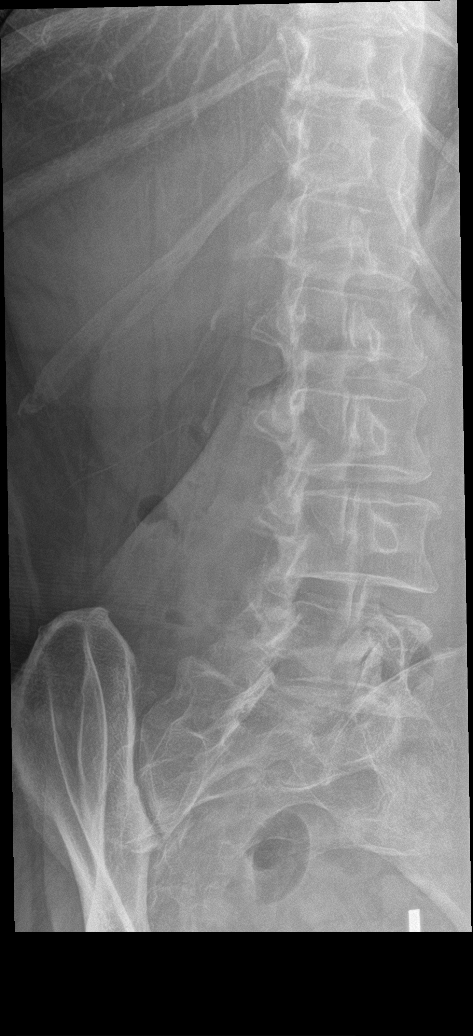

[l-spine lat]
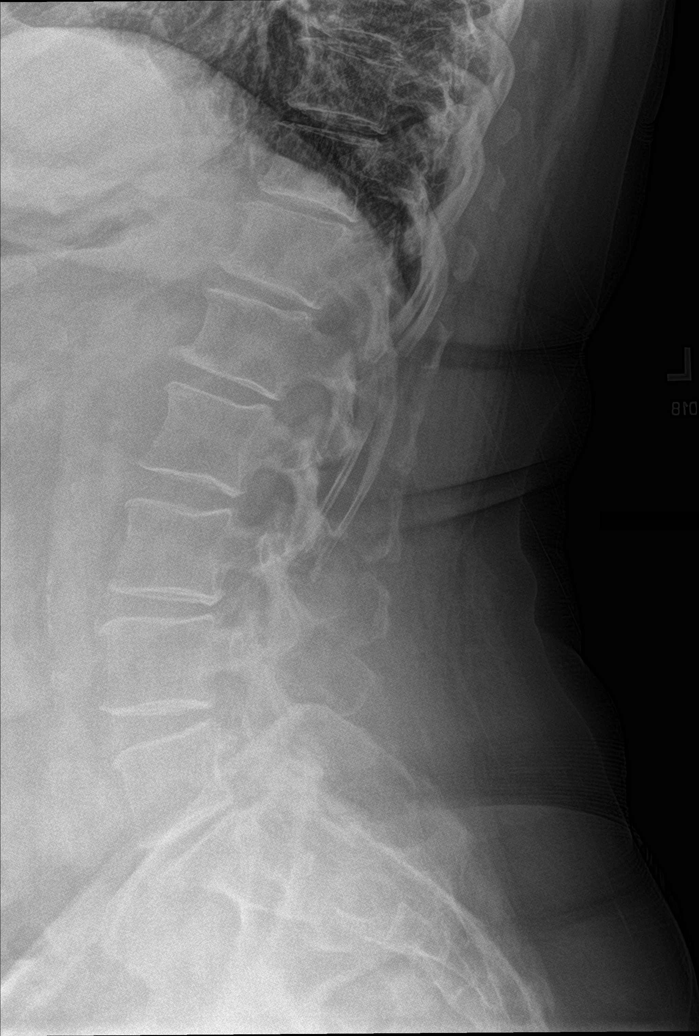

[l-spine flex]
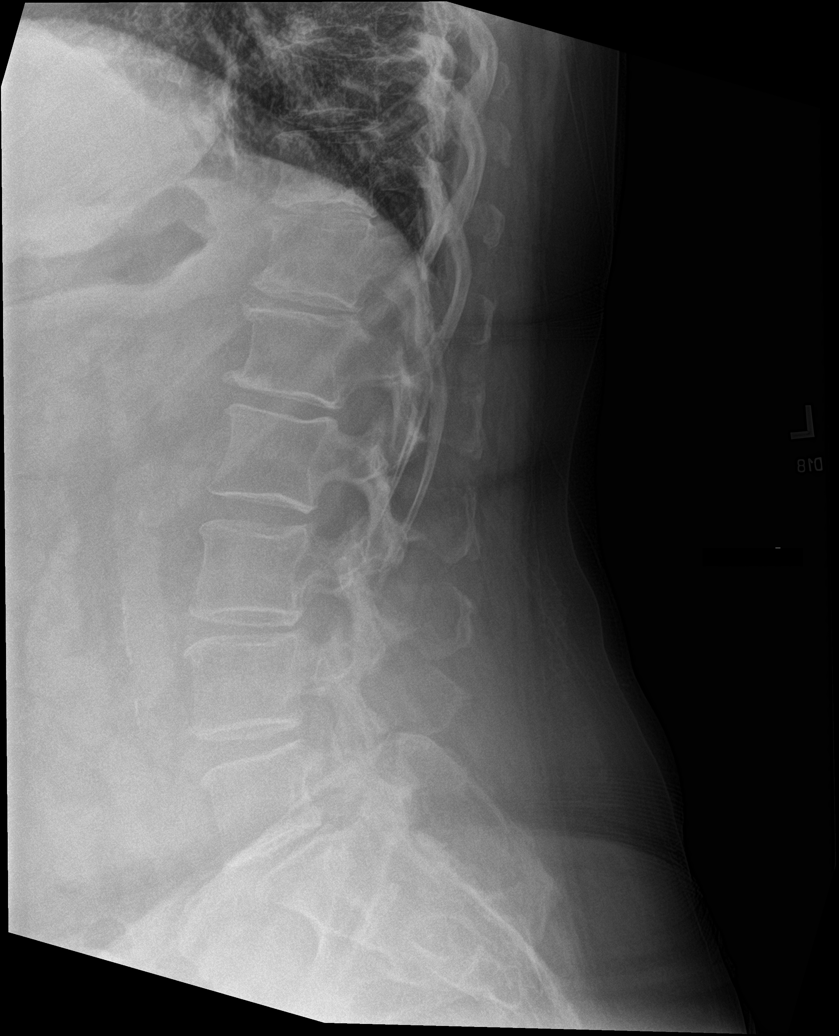

[l-spine ext]
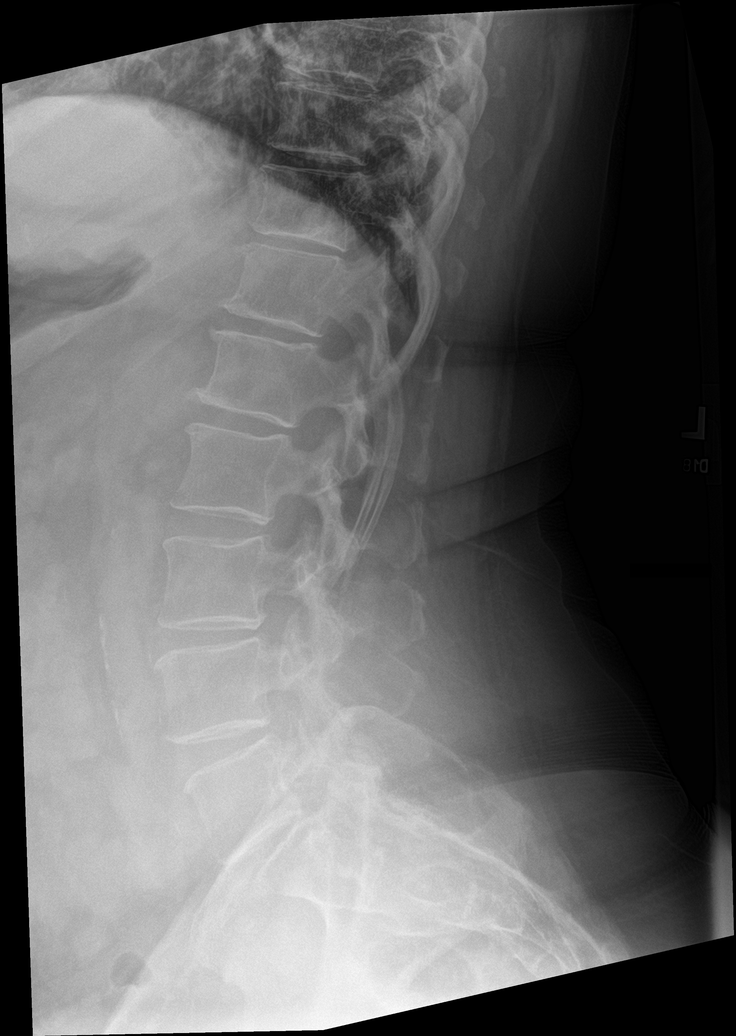

[l-spine spot]
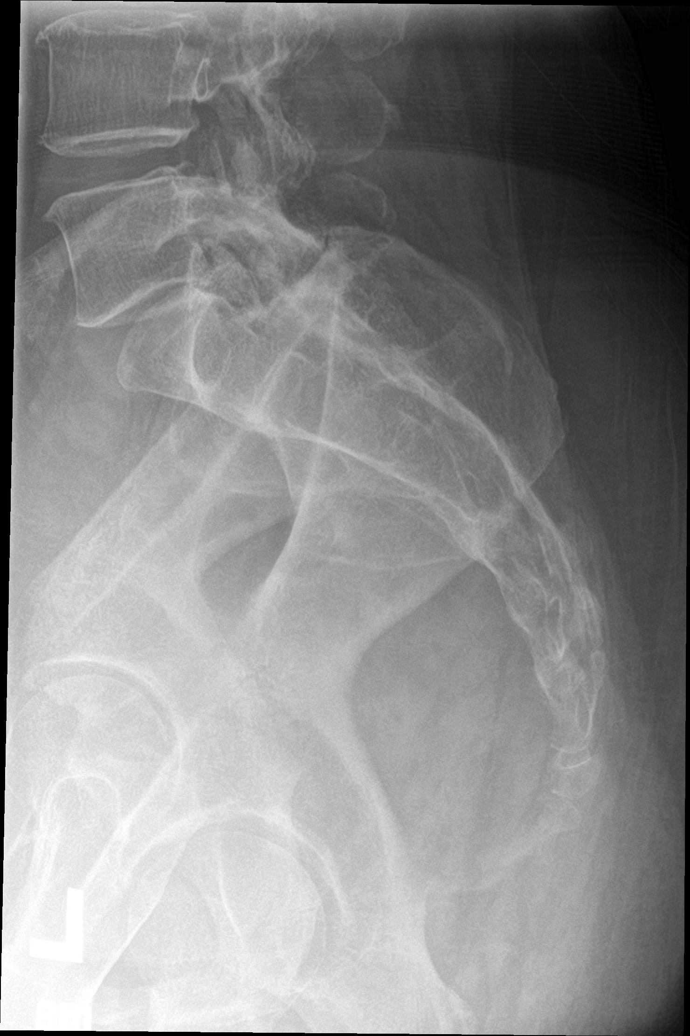

[7 of 7 positions shown; findings below may reference images not displayed]

FINDINGS: Five lumbar type vertebrae. Generalized disc space narrowing with
mild endplate ridging, height loss greatest at L5-S1. Degenerative
facet spurring mainly at L4-5 and L5-S1. Flexion extension views
show limited range of motion. No dynamic listhesis. No fracture
deformity or evidence of bone lesion. Atheromatous calcification of
the aorta.
IMPRESSION: 1. Generalized disc space narrowing and lower lumbar degenerative
facet spurring.
2. Limited movement with flexion and extension. No dynamic
listhesis.

## 2023-07-12 ENCOUNTER — Other Ambulatory Visit: Payer: Self-pay

## 2023-07-12 ENCOUNTER — Encounter: Payer: Self-pay | Admitting: Family Medicine

## 2023-07-12 DIAGNOSIS — E669 Obesity, unspecified: Secondary | ICD-10-CM

## 2023-07-12 MED ORDER — WEGOVY 2.4 MG/0.75ML ~~LOC~~ SOAJ: 2.4000 mg | SUBCUTANEOUS | 1 refills | Status: DC

## 2023-07-17 ENCOUNTER — Encounter: Payer: Self-pay | Admitting: Student in an Organized Health Care Education/Training Program

## 2023-07-17 ENCOUNTER — Encounter: Payer: Self-pay | Admitting: Family Medicine

## 2023-07-18 ENCOUNTER — Telehealth: Payer: Self-pay | Admitting: Family Medicine

## 2023-07-18 ENCOUNTER — Telehealth: Payer: Self-pay

## 2023-07-18 DIAGNOSIS — Z6835 Body mass index (BMI) 35.0-35.9, adult: Secondary | ICD-10-CM | POA: Insufficient documentation

## 2023-07-18 NOTE — Telephone Encounter (Signed)
 Erica Walters

## 2023-07-18 NOTE — Progress Notes (Signed)
Attempted to call patinet about pre virtual appt questions.  Voicemail was full.  Will leave message on my chart.

## 2023-07-18 NOTE — Telephone Encounter (Signed)
 Marland Kitchen

## 2023-07-22 ENCOUNTER — Encounter: Payer: Self-pay | Admitting: Family Medicine

## 2023-07-22 ENCOUNTER — Ambulatory Visit
Payer: Self-pay | Attending: Student in an Organized Health Care Education/Training Program | Admitting: Student in an Organized Health Care Education/Training Program

## 2023-07-22 DIAGNOSIS — G8929 Other chronic pain: Secondary | ICD-10-CM

## 2023-07-22 DIAGNOSIS — M5416 Radiculopathy, lumbar region: Secondary | ICD-10-CM

## 2023-07-22 DIAGNOSIS — G894 Chronic pain syndrome: Secondary | ICD-10-CM

## 2023-07-22 DIAGNOSIS — M5116 Intervertebral disc disorders with radiculopathy, lumbar region: Secondary | ICD-10-CM

## 2023-07-22 NOTE — Progress Notes (Signed)
Patient: Erica Walters  Service Category: E/M  Provider: Edward Jolly, MD  DOB: [birth year]  DOS: 07/22/2023  Location: Office  MRN: [id number]  Setting: Ambulatory outpatient  Referring Provider: Donita Brooks, MD  Type: Established Patient  Specialty: Interventional Pain Management  PCP: Donita Brooks, MD  Location: Remote location  Delivery: TeleHealth     Virtual Encounter - Pain Management PROVIDER NOTE: Information contained herein reflects review and annotations entered in association with encounter. Interpretation of such information and data should be left to medically-trained personnel. Information provided to patient can be located elsewhere in the medical record under "Patient Instructions". Document created using STT-dictation technology, any transcriptional errors that may result from process are unintentional.    Contact & Pharmacy Preferred: [phone] Home: [phone] (home) Mobile: [phone] (mobile) E-mail: [email or web address]  [facility] - [place], [place] - [address] [place] [address] Phone: [phone] Fax: [phone]   Pre-screening  Erica Walters offered "in-person" vs "virtual" encounter. She indicated preferring virtual for this encounter.   Reason COVID-19*  Social distancing based on CDC and AMA recommendations.   I contacted Erica Walters on 07/22/2023 via telephone.      I clearly identified myself as Edward Jolly, MD. I verified that I was speaking with the correct person using two identifiers (Name: Erica Walters, and date of birth: 1965/10/02).  Consent I sought verbal advanced consent from Erica Walters for virtual visit interactions. I informed Erica Walters of possible security and privacy concerns, risks, and limitations associated with providing "not-in-person" medical evaluation and management services. I also informed Erica Walters of the availability of "in-person"  appointments. Finally, I informed her that there would be a charge for the virtual visit and that she could be  personally, fully or partially, financially responsible for it. Erica Walters expressed understanding and agreed to proceed.   Historic Elements   Erica Walters is a 58 y.o. year old, female patient evaluated today after our last contact on 05/27/2023. Erica Walters  has a past medical history of Allergy, Arthritis, DJD (degenerative joint disease), and Hyperlipidemia. She also  has a past surgical history that includes Total abdominal hysterectomy w/ bilateral salpingoophorectomy; Colonoscopy (12/04/2017); and Colonoscopy (2020). Erica Walters has a current medication list which includes the following prescription(s): celecoxib, cyclobenzaprine, diclofenac, losartan, nortriptyline, pregabalin, wegovy, wegovy, and tramadol. She  reports that she has been smoking cigarettes. She has a 13 pack-year smoking history. She has never used smokeless tobacco. She reports current alcohol use of about 3.0 standard drinks of alcohol per week. She reports that she does not use drugs. Erica Walters has no known allergies.  BMI: Estimated body mass index is 33.41 kg/m as calculated from the following:   Height as of 05/27/23: 5\' 6"  (1.676 m).   Weight as of 05/27/23: 207 lb (93.9 kg). Last encounter: 05/09/2023. Last procedure: 05/27/2023.  HPI  Today, she is being contacted for a post-procedure assessment.   Post-procedure evaluation    Type: Lumbar epidural steroid injection (LESI) (interlaminar) #3    Laterality: Left   Level:  L3-4 Level.  Imaging: Fluoroscopic guidance         Anesthesia: Local anesthesia (1-2% Lidocaine) DOS: 05/27/2023  Performed by: Edward Jolly, MD  Purpose: Diagnostic/Therapeutic Indications: Lumbar radicular pain of intraspinal etiology of more than 4 weeks that has failed to respond to conservative therapy and is severe enough to impact quality of life or function. 1.  Lumbar disc  herniation with radiculopathy   2. Lumbar radicular pain   3. Chronic radicular lumbar pain   4. Chronic left SI joint pain   5. Piriformis syndrome of left side   6. Chronic pain syndrome   7. SI (sacroiliac) joint dysfunction    NAS-11 Pain score:   Pre-procedure: 3 /10   Post-procedure: 0-No pain/10     Effectiveness:  Initial hour after procedure: 100 %  Subsequent 4-6 hours post-procedure: 100 %  Analgesia past initial 6 hours: 75 % (lasting a couple of weeks.)  Ongoing improvement:  Analgesic:  75% Function: Erica Walters reports improvement in function ROM: Erica Walters reports improvement in ROM   Laboratory Chemistry Profile   Renal Lab Results  Component Value Date   BUN 17 02/15/2023   CREATININE 0.66 02/15/2023   BCR SEE NOTE: 02/15/2023   GFRAA 97 01/23/2019   GFRNONAA 84 01/23/2019    Hepatic Lab Results  Component Value Date   AST 32 02/15/2023   ALT 44 (H) 02/15/2023   ALBUMIN 4.1 07/19/2015   ALKPHOS 83 07/19/2015   LIPASE 30 07/19/2015    Electrolytes Lab Results  Component Value Date   NA 141 02/15/2023   K 4.5 02/15/2023   CL 105 02/15/2023   CALCIUM 9.3 02/15/2023    Bone Lab Results  Component Value Date   VD25OH 31 01/23/2019    Inflammation (CRP: Acute Phase) (ESR: Chronic Phase) Lab Results  Component Value Date   CRP 4.7 12/25/2018   ESRSEDRATE 14 12/25/2018         Note: Above Lab results reviewed.    Assessment  The primary encounter diagnosis was Lumbar disc herniation with radiculopathy. Diagnoses of Lumbar radicular pain, Chronic radicular lumbar pain, and Chronic pain syndrome were also pertinent to this visit.  Plan of Care  Excellent response to left L3-L4 epidural steroid injection.  Continue to monitor symptoms, repeat as needed.    Follow-up plan:   Return for patient will call to schedule F2F appt prn.     Recent Visits Date Type Provider Dept  05/27/23 Procedure visit Edward Jolly, MD  [facility]  05/09/23 Office Visit Edward Jolly, MD [facility]  Showing recent visits within past 90 days and meeting all other requirements Today's Visits Date Type Provider Dept  07/22/23 Office Visit Edward Jolly, MD [facility]  Showing today's visits and meeting all other requirements Future Appointments No visits were found meeting these conditions. Showing future appointments within next 90 days and meeting all other requirements  I discussed the assessment and treatment plan with the patient. The patient was provided an opportunity to ask questions and all were answered. The patient agreed with the plan and demonstrated an understanding of the instructions.  Patient advised to call back or seek an in-person evaluation if the symptoms or condition worsens.  Duration of encounter: .  Note by: Edward Jolly, MD Date: 07/22/2023; Time: 3:44 PM

## 2023-07-23 NOTE — Patient Instructions (Signed)

## 2023-08-05 ENCOUNTER — Encounter: Payer: Self-pay | Admitting: Student in an Organized Health Care Education/Training Program

## 2023-08-05 DIAGNOSIS — M5416 Radiculopathy, lumbar region: Secondary | ICD-10-CM

## 2023-08-05 DIAGNOSIS — G8929 Other chronic pain: Secondary | ICD-10-CM

## 2023-08-05 NOTE — Telephone Encounter (Signed)
 Had LESI on 05-27-23

## 2023-08-05 NOTE — Telephone Encounter (Signed)
 Looks like her current order expires 08-09-23. Will you please re order?

## 2023-09-04 ENCOUNTER — Encounter: Payer: Self-pay | Admitting: Student in an Organized Health Care Education/Training Program

## 2023-09-04 ENCOUNTER — Ambulatory Visit
Payer: 59 | Attending: Student in an Organized Health Care Education/Training Program | Admitting: Student in an Organized Health Care Education/Training Program

## 2023-09-04 ENCOUNTER — Ambulatory Visit
Admission: RE | Admit: 2023-09-04 | Discharge: 2023-09-04 | Disposition: A | Discharge status: Home / Self Care | Source: Ambulatory Visit | Attending: Student in an Organized Health Care Education/Training Program | Admitting: Student in an Organized Health Care Education/Training Program

## 2023-09-04 DIAGNOSIS — G8929 Other chronic pain: Secondary | ICD-10-CM | POA: Diagnosis not present

## 2023-09-04 DIAGNOSIS — M5416 Radiculopathy, lumbar region: Secondary | ICD-10-CM | POA: Diagnosis present

## 2023-09-04 MED ORDER — IOHEXOL 180 MG/ML  SOLN
10.0000 mL | Freq: Once | INTRAMUSCULAR | Status: AC
Start: 2023-09-04 — End: 2023-09-04
  Administered 2023-09-04: 10 mL via EPIDURAL

## 2023-09-04 MED ORDER — LIDOCAINE HCL 2 % IJ SOLN
20.0000 mL | Freq: Once | INTRAMUSCULAR | Status: AC
  Administered 2023-09-04: 400 mg

## 2023-09-04 MED ORDER — IOHEXOL 180 MG/ML  SOLN
INTRAMUSCULAR | Status: AC
  Filled 2023-09-04: qty 20

## 2023-09-04 MED ORDER — ROPIVACAINE HCL 2 MG/ML IJ SOLN
INTRAMUSCULAR | Status: AC
  Filled 2023-09-04: qty 20

## 2023-09-04 MED ORDER — DEXAMETHASONE SODIUM PHOSPHATE 10 MG/ML IJ SOLN
10.0000 mg | Freq: Once | INTRAMUSCULAR | Status: AC
  Administered 2023-09-04: 10 mg

## 2023-09-04 MED ORDER — DEXAMETHASONE SODIUM PHOSPHATE 10 MG/ML IJ SOLN
INTRAMUSCULAR | Status: AC
  Filled 2023-09-04: qty 1

## 2023-09-04 MED ORDER — ROPIVACAINE HCL 2 MG/ML IJ SOLN
2.0000 mL | Freq: Once | INTRAMUSCULAR | Status: AC
  Administered 2023-09-04: 2 mL via EPIDURAL

## 2023-09-04 MED ORDER — LIDOCAINE HCL 2 % IJ SOLN
INTRAMUSCULAR | Status: AC
  Filled 2023-09-04: qty 20

## 2023-09-04 MED ORDER — SODIUM CHLORIDE (PF) 0.9 % IJ SOLN
INTRAMUSCULAR | Status: AC
  Filled 2023-09-04: qty 10

## 2023-09-04 MED ORDER — SODIUM CHLORIDE 0.9% FLUSH
2.0000 mL | Freq: Once | INTRAVENOUS | Status: AC
  Administered 2023-09-04: 2 mL

## 2023-09-04 NOTE — Patient Instructions (Signed)

## 2023-09-04 NOTE — Progress Notes (Signed)
 Safety precautions to be maintained throughout the outpatient stay will include: orient to surroundings, keep bed in low position, maintain call bell within reach at all times, provide assistance with transfer out of bed and ambulation.

## 2023-09-04 NOTE — Progress Notes (Signed)
 PROVIDER NOTE: Interpretation of information contained herein should be left to medically-trained personnel. Specific patient instructions are provided elsewhere under "Patient Instructions" section of medical record. This document was created in part using STT-dictation technology, any transcriptional errors that may result from this process are unintentional.  Patient: Erica Walters Type: Established DOB: July 18, 1965 MRN: [id number] PCP: Donita Brooks, MD  Service: Procedure DOS: 09/04/2023 Setting: Ambulatory Location: Ambulatory outpatient facility Delivery: Face-to-face Provider: Edward Jolly, MD Specialty: Interventional Pain Management Specialty designation: 09 Location: Outpatient facility Ref. Prov.: Edward Jolly, MD    Primary Reason for Visit: Interventional Pain Management Treatment. CC: Back Pain (lower)   Procedure:           Type: Lumbar epidural steroid injection (LESI) (interlaminar) #4    Laterality: Left   Level:  L3-4 Level.  Imaging: Fluoroscopic guidance         Anesthesia: Local anesthesia (1-2% Lidocaine) DOS: 09/04/2023  Performed by: Edward Jolly, MD  Purpose: Diagnostic/Therapeutic Indications: Lumbar radicular pain of intraspinal etiology of more than 4 weeks that has failed to respond to conservative therapy and is severe enough to impact quality of life or function. 1. Lumbar radicular pain   2. Chronic radicular lumbar pain    NAS-11 Pain score:   Pre-procedure: 3 /10   Post-procedure: 3 /10      Position / Prep / Materials:  Position: Prone w/ head of the table raised (slight reverse trendelenburg) to facilitate breathing.  Prep solution: DuraPrep (Iodine Povacrylex [0.7% available iodine] and Isopropyl Alcohol, 74% w/w) Prep Area: Entire Posterior Lumbar Region from lower scapular tip down to mid buttocks area and from flank to flank. Materials:  Tray: Epidural tray Needle(s):  Type: Epidural needle (Tuohy) Gauge (G):  22 Length:  Regular (3.5-in) Qty: 1  Pre-op H&P Assessment:  Erica Walters is a 58 y.o. (year old), female patient, seen today for interventional treatment. She  has a past surgical history that includes Total abdominal hysterectomy w/ bilateral salpingoophorectomy; Colonoscopy (12/04/2017); and Colonoscopy (2020). Erica Walters has a current medication list which includes the following prescription(s): celecoxib, cyclobenzaprine, diclofenac, losartan, nortriptyline, pregabalin, wegovy, wegovy, and tramadol. Her primarily concern today is the Back Pain (lower)  Initial Vital Signs:  Pulse/HCG Rate: 91  Temp:  (!) 97.2 F (36.2 C) Resp: 16 BP: 119/85 SpO2: 98 %  BMI: Estimated body mass index is 31.96 kg/m as calculated from the following:   Height as of this encounter: 5\' 6"  (1.676 m).   Weight as of this encounter: 198 lb (89.8 kg).  Risk Assessment: Allergies: Reviewed. She has no known allergies.  Allergy Precautions: None required Coagulopathies: Reviewed. None identified.  Blood-thinner therapy: None at this time Active Infection(s): Reviewed. None identified. Erica Walters is afebrile  Site Confirmation: Erica Walters was asked to confirm the procedure and laterality before marking the site Procedure checklist: Completed Consent: Before the procedure and under the influence of no sedative(s), amnesic(s), or anxiolytics, the patient was informed of the treatment options, risks and possible complications. To fulfill our ethical and legal obligations, as recommended by the American Medical Association's Code of Ethics, I have informed the patient of my clinical impression; the nature and purpose of the treatment or procedure; the risks, benefits, and possible complications of the intervention; the alternatives, including doing nothing; the risk(s) and benefit(s) of the alternative treatment(s) or procedure(s); and the risk(s) and benefit(s) of doing nothing. The patient was provided information  about the general risks and possible complications associated with the  procedure. These may include, but are not limited to: failure to achieve desired goals, infection, bleeding, organ or nerve damage, allergic reactions, paralysis, and death. In addition, the patient was informed of those risks and complications associated to Spine-related procedures, such as failure to decrease pain; infection (i.e.: Meningitis, epidural or intraspinal abscess); bleeding (i.e.: epidural hematoma, subarachnoid hemorrhage, or any other type of intraspinal or peri-dural bleeding); organ or nerve damage (i.e.: Any type of peripheral nerve, nerve root, or spinal cord injury) with subsequent damage to sensory, motor, and/or autonomic systems, resulting in permanent pain, numbness, and/or weakness of one or several areas of the body; allergic reactions; (i.e.: anaphylactic reaction); and/or death. Furthermore, the patient was informed of those risks and complications associated with the medications. These include, but are not limited to: allergic reactions (i.e.: anaphylactic or anaphylactoid reaction(s)); adrenal axis suppression; blood sugar elevation that in diabetics may result in ketoacidosis or comma; water retention that in patients with history of congestive heart failure may result in shortness of breath, pulmonary edema, and decompensation with resultant heart failure; weight gain; swelling or edema; medication-induced neural toxicity; particulate matter embolism and blood vessel occlusion with resultant organ, and/or nervous system infarction; and/or aseptic necrosis of one or more joints. Finally, the patient was informed that Medicine is not an exact science; therefore, there is also the possibility of unforeseen or unpredictable risks and/or possible complications that may result in a catastrophic outcome. The patient indicated having understood very clearly. We have given the patient no guarantees and we have made no  promises. Enough time was given to the patient to ask questions, all of which were answered to the patient's satisfaction. Erica Walters has indicated that she wanted to continue with the procedure. Attestation: I, the ordering provider, attest that I have discussed with the patient the benefits, risks, side-effects, alternatives, likelihood of achieving goals, and potential problems during recovery for the procedure that I have provided informed consent. Date  Time: 09/04/2023  9:20 AM  Pre-Procedure Preparation:  Monitoring: As per clinic protocol. Respiration, ETCO2, SpO2, BP, heart rate and rhythm monitor placed and checked for adequate function Safety Precautions: Patient was assessed for positional comfort and pressure points before starting the procedure. Time-out: I initiated and conducted the "Time-out" before starting the procedure, as per protocol. The patient was asked to participate by confirming the accuracy of the "Time Out" information. Verification of the correct person, site, and procedure were performed and confirmed by me, the nursing staff, and the patient. "Time-out" conducted as per Joint Commission's Universal Protocol (UP.01.01.01). Time: 1018  Description/Narrative of Procedure:          Target: Epidural space via interlaminar opening, initially targeting the lower laminar border of the superior vertebral body. Region: Lumbar Approach: Percutaneous paravertebral  Rationale (medical necessity): procedure needed and proper for the diagnosis and/or treatment of the patient's medical symptoms and needs. Procedural Technique Safety Precautions: Aspiration looking for blood return was conducted prior to all injections. At no point did we inject any substances, as a needle was being advanced. No attempts were made at seeking any paresthesias. Safe injection practices and needle disposal techniques used. Medications properly checked for expiration dates. SDV (single dose vial)  medications used. Description of the Procedure: Protocol guidelines were followed. The procedure needle was introduced through the skin, ipsilateral to the reported pain, and advanced to the target area. Bone was contacted and the needle walked caudad, until the lamina was cleared. The epidural space was identified using "loss-of-resistance  technique" with 2-3 ml of PF-NaCl (0.9% NSS), in a 5cc LOR glass syringe.   6 cc solution made of 3 cc of preservative-free saline, 2 cc of 0.2% ropivacaine, 1 cc of Decadron 10 mg/cc.   Vitals:   09/04/23 0929 09/04/23 1012 09/04/23 1020 09/04/23 1025  BP: 119/85 (!) 129/99 (!) 134/99 (!) 131/97  Pulse: 91 90 84 82  Resp:  16 15 16   Temp: (!) 97.2 F (36.2 C)     SpO2: 98% 99% 99% 99%  Weight: 198 lb (89.8 kg)     Height: 5\' 6"  (1.676 m)       Start Time: 1018 hrs. End Time: 1021 hrs.  Imaging Guidance (Spinal):          Type of Imaging Technique: Fluoroscopy Guidance (Spinal) Indication(s): Assistance in needle guidance and placement for procedures requiring needle placement in or near specific anatomical locations not easily accessible without such assistance. Exposure Time: Please see nurses notes. Contrast: Before injecting any contrast, we confirmed that the patient did not have an allergy to iodine, shellfish, or radiological contrast. Once satisfactory needle placement was completed at the desired level, radiological contrast was injected. Contrast injected under live fluoroscopy. No contrast complications. See chart for type and volume of contrast used. Fluoroscopic Guidance: I was personally present during the use of fluoroscopy. "Tunnel Vision Technique" used to obtain the best possible view of the target area. Parallax error corrected before commencing the procedure. "Direction-depth-direction" technique used to introduce the needle under continuous pulsed fluoroscopy. Once target was reached, antero-posterior, oblique, and lateral  fluoroscopic projection used confirm needle placement in all planes. Images permanently stored in EMR. Interpretation: I personally interpreted the imaging intraoperatively. Adequate needle placement confirmed in multiple planes. Appropriate spread of contrast into desired area was observed. No evidence of afferent or efferent intravascular uptake. No intrathecal or subarachnoid spread observed. Permanent images saved into the patient's record.   Post-operative Assessment:  Post-procedure Vital Signs:  Pulse/HCG Rate: 82  Temp:  (!) 97.2 F (36.2 C) Resp: 16 BP:  (!) 131/97 SpO2: 99 %  EBL: None  Complications: No immediate post-treatment complications observed by team, or reported by patient.  Note: The patient tolerated the entire procedure well. A repeat set of vitals were taken after the procedure and the patient was kept under observation following institutional policy, for this type of procedure. Post-procedural neurological assessment was performed, showing return to baseline, prior to discharge. The patient was provided with post-procedure discharge instructions, including a section on how to identify potential problems. Should any problems arise concerning this procedure, the patient was given instructions to immediately contact us, at any time, without hesitation. In any case, we plan to contact the patient by telephone for a follow-up status report regarding this interventional procedure.  Comments:  No additional relevant information.  Plan of Care  Orders:  Orders Placed This Encounter  Procedures   DG PAIN CLINIC C-ARM 1-60 MIN NO REPORT    Intraoperative interpretation by procedural physician at [facility].    Standing Status:   Standing    Number of Occurrences:   1    Reason for exam::   Assistance in needle guidance and placement for procedures requiring needle placement in or near specific anatomical locations not easily accessible without such  assistance.   5 out of 5 strength bilateral lower extremity: Plantar flexion, dorsiflexion, knee flexion, knee extension.   Medications ordered for procedure: Meds ordered this encounter  Medications   iohexol (  OMNIPAQUE) 180 MG/ML injection 10 mL    Must be Myelogram-compatible. If not available, you may substitute with a water-soluble, non-ionic, hypoallergenic, myelogram-compatible radiological contrast medium.   lidocaine (XYLOCAINE) 2 % (with pres) injection 400 mg   ropivacaine (PF) 2 mg/mL (0.2%) (NAROPIN) injection 2 mL   sodium chloride flush (NS) 0.9 % injection 2 mL   dexamethasone (DECADRON) injection 10 mg   Medications administered: We administered iohexol, lidocaine, ropivacaine (PF) 2 mg/mL (0.2%), sodium chloride flush, and dexamethasone.  See the medical record for exact dosing, route, and time of administration.  Follow-up plan:   Return for Keep sch. appt.     Recent Visits Date Type Provider Dept  07/22/23 Office Visit Edward Jolly, MD [facility]  Showing recent visits within past 90 days and meeting all other requirements Today's Visits Date Type Provider Dept  09/04/23 Procedure visit Edward Jolly, MD [facility]  Showing today's visits and meeting all other requirements Future Appointments Date Type Provider Dept  10/31/23 Appointment Edward Jolly, MD [facility]  Showing future appointments within next 90 days and meeting all other requirements  Disposition: Discharge home  Discharge (Date  Time): 09/04/2023; 1026 hrs.   Primary Care Physician: Donita Brooks, MD Location: [facility] Note by: Edward Jolly, MD Date: 09/04/2023; Time: 10:30 AM  Disclaimer:  Medicine is not an exact science. The only guarantee in medicine is that nothing is guaranteed. It is important to note that the decision to proceed with this intervention was based on the information collected from the patient. The  Data and conclusions were drawn from the patient's questionnaire, the interview, and the physical examination. Because the information was provided in large part by the patient, it cannot be guaranteed that it has not been purposely or unconsciously manipulated. Every effort has been made to obtain as much relevant data as possible for this evaluation. It is important to note that the conclusions that lead to this procedure are derived in large part from the available data. Always take into account that the treatment will also be dependent on availability of resources and existing treatment guidelines, considered by other Pain Management Practitioners as being common knowledge and practice, at the time of the intervention. For Medico-Legal purposes, it is also important to point out that variation in procedural techniques and pharmacological choices are the acceptable norm. The indications, contraindications, technique, and results of the above procedure should only be interpreted and judged by a Board-Certified Interventional Pain Specialist with extensive familiarity and expertise in the same exact procedure and technique.

## 2023-09-05 ENCOUNTER — Telehealth: Payer: Self-pay

## 2023-09-05 NOTE — Telephone Encounter (Signed)
 Patient states no issues post-procedure.

## 2023-09-10 ENCOUNTER — Encounter: Payer: Self-pay | Admitting: Family Medicine

## 2023-09-10 ENCOUNTER — Other Ambulatory Visit: Payer: Self-pay

## 2023-09-10 DIAGNOSIS — E669 Obesity, unspecified: Secondary | ICD-10-CM

## 2023-09-10 MED ORDER — WEGOVY 2.4 MG/0.75ML ~~LOC~~ SOAJ
2.4000 mg | SUBCUTANEOUS | 1 refills | Status: DC
Start: 2023-09-10 — End: 2023-11-04

## 2023-10-30 NOTE — Progress Notes (Unsigned)
 PROVIDER NOTE: Interpretation of information contained herein should be left to medically-trained personnel. Specific patient instructions are provided elsewhere under "Patient Instructions" section of medical record. This document was created in part using AI and STT-dictation technology, any transcriptional errors that may result from this process are unintentional.  Patient: Erica Walters  Service: E/M   PCP: Erica Lefort, MD  DOB: March 12, 1966  DOS: 10/31/2023  Provider: Cherylin Corrigan, NP  MRN: [id number]  Delivery: Face-to-face  Specialty: Interventional Pain Management  Type: Established Patient  Setting: Ambulatory outpatient facility  Specialty designation: 09  Referring Prov.: Erica Lefort, MD  Location: Outpatient office facility       HPI  Ms. Erica Walters, a 58 y.o. year old female, is here today because of her No primary diagnosis found.. Ms. Erica Walters's primary complain today is No chief complaint on file.   Pain Assessment: Severity of   is reported as a  /10. Location:    / . Onset:  . Quality:  . Timing:  . Modifying factor(s):  Erica Walters Vitals:  vitals were not taken for this visit.  BMI: Estimated body mass index is 31.96 kg/m as calculated from the following:   Height as of 09/04/23: 5\' 6"  (1.676 m).   Weight as of 09/04/23: 198 lb (89.8 kg). Last encounter: 09/05/2023.  Reason for encounter: medication management. No change in medical history since last visit.  Patient's pain is at baseline.  Patient continues multimodal pain regimen as prescribed.  States that it provides pain relief and improvement in functional status.   Pharmacotherapy Assessment  Analgesic: {There is no content from the last Subjective section.}   Monitoring: [place] PMP: PDMP reviewed during this encounter.       Pharmacotherapy: No side-effects or adverse reactions reported. Compliance: No problems identified. Effectiveness: Clinically acceptable.  No notes on file  No results found for:  "CBDTHCR" No results found for: "D8THCCBX" No results found for: "D9THCCBX"  UDS:  Summary  Date Value Ref Range Status  01/22/2022 Note  Final    Comment:    ==================================================================== Compliance Drug Analysis, Ur ==================================================================== Test                             Result       Flag       Units  Drug Present and Declared for Prescription Verification   Tramadol                        >2336        EXPECTED   ng/mg creat   O-Desmethyltramadol            >2336        EXPECTED   ng/mg creat   N-Desmethyltramadol            >2336        EXPECTED   ng/mg creat    Source of tramadol  is a prescription medication. O-desmethyltramadol    and N-desmethyltramadol are expected metabolites of tramadol .    Pregabalin                      PRESENT      EXPECTED   Desmethylcyclobenzaprine       PRESENT      EXPECTED    Desmethylcyclobenzaprine is an expected metabolite of    cyclobenzaprine .    Nortriptyline   PRESENT      EXPECTED    Nortriptyline  may be administered as a prescription drug; it is also    an expected metabolite of amitriptyline.  Drug Present not Declared for Prescription Verification   Ibuprofen                      PRESENT      UNEXPECTED ==================================================================== Test                      Result    Flag   Units      Ref Range   Creatinine              214              mg/dL      >=16 ==================================================================== Declared Medications:  The flagging and interpretation on this report are based on the  following declared medications.  Unexpected results may arise from  inaccuracies in the declared medications.   **Note: The testing scope of this panel includes these medications:   Cyclobenzaprine  (Flexeril )  Nortriptyline  (Pamelor )  Pregabalin  (Lyrica )  Tramadol  (Ultram )   **Note: The  testing scope of this panel does not include the  following reported medications:   Semaglutide  ==================================================================== For clinical consultation, please call [phone]. ====================================================================       ROS  Constitutional: Denies any fever or chills Gastrointestinal: No reported hemesis, hematochezia, vomiting, or acute GI distress Musculoskeletal: Denies any acute onset joint swelling, redness, loss of ROM, or weakness Neurological: No reported episodes of acute onset apraxia, aphasia, dysarthria, agnosia, amnesia, paralysis, loss of coordination, or loss of consciousness  Medication Review  Semaglutide -Weight Management, celecoxib , cyclobenzaprine , diclofenac , losartan , nortriptyline , pregabalin , and traMADol   History Review  Allergy: Ms. Erica Walters has no known allergies. Drug: Ms. Erica Walters  reports no history of drug use. Alcohol:  reports current alcohol use of about 3.0 standard drinks of alcohol per week. Tobacco:  reports that she has been smoking cigarettes. She has a 13 pack-year smoking history. She has never used smokeless tobacco. Social: Ms. Erica Walters  reports that she has been smoking cigarettes. She has a 13 pack-year smoking history. She has never used smokeless tobacco. She reports current alcohol use of about 3.0 standard drinks of alcohol per week. She reports that she does not use drugs. Medical:  has a past medical history of Allergy, Arthritis, DJD (degenerative joint disease), and Hyperlipidemia. Surgical: Ms. Erica Walters  has a past surgical history that includes Total abdominal hysterectomy w/ bilateral salpingoophorectomy; Colonoscopy (12/04/2017); and Colonoscopy (2020). Family: family history includes Colon cancer in her paternal grandfather; Colon polyps (age of onset: 26) in her paternal grandfather.  Laboratory Chemistry Profile   Renal Lab Results  Component Value  Date   BUN 17 02/15/2023   CREATININE 0.66 02/15/2023   BCR SEE NOTE: 02/15/2023   GFRAA 97 01/23/2019   GFRNONAA 84 01/23/2019    Hepatic Lab Results  Component Value Date   AST 32 02/15/2023   ALT 44 (H) 02/15/2023   ALBUMIN 4.1 07/19/2015   ALKPHOS 83 07/19/2015   LIPASE 30 07/19/2015    Electrolytes Lab Results  Component Value Date   NA 141 02/15/2023   K 4.5 02/15/2023   CL 105 02/15/2023   CALCIUM  9.3 02/15/2023    Bone Lab Results  Component Value Date   VD25OH 31 01/23/2019    Inflammation (CRP: Acute Phase) (ESR: Chronic Phase) Lab Results  Component Value Date  CRP 4.7 12/25/2018   ESRSEDRATE 14 12/25/2018         Note: Above Lab results reviewed.  Recent Imaging Review  DG PAIN CLINIC C-ARM 1-60 MIN NO REPORT Fluoro was used, but no Radiologist interpretation will be provided.  Please refer to "NOTES" tab for provider progress note. Note: Reviewed        Physical Exam  General appearance: Well nourished, well developed, and well hydrated. In no apparent acute distress Mental status: Alert, oriented x 3 (person, place, & time)       Respiratory: No evidence of acute respiratory distress Eyes: PERLA Vitals: There were no vitals taken for this visit. BMI: Estimated body mass index is 31.96 kg/m as calculated from the following:   Height as of 09/04/23: 5\' 6"  (1.676 m).   Weight as of 09/04/23: 198 lb (89.8 kg). Ideal: Patient weight not recorded  Assessment   Diagnosis Status  1. Lumbar disc herniation with radiculopathy   2. Chronic pain syndrome   3. Neuropathy involving both lower extremities   4. Lumbar facet arthropathy    Controlled Controlled Controlled   Plan of Care  Assessment and Plan             Pharmacotherapy (Medications Ordered): No orders of the defined types were placed in this encounter.  Orders:  No orders of the defined types were placed in this encounter.  Follow-up plan:   No follow-ups on file.     Recent Visits Date Type Provider Dept  09/04/23 Procedure visit Cephus Collin, MD [facility]  Showing recent visits within past 90 days and meeting all other requirements Future Appointments Date Type Provider Dept  10/31/23 Appointment Erline Siddoway K, NP [facility]  Showing future appointments within next 90 days and meeting all other requirements  I discussed the assessment and treatment plan with the patient. The patient was provided an opportunity to ask questions and all were answered. The patient agreed with the plan and demonstrated an understanding of the instructions.  Patient advised to call back or seek an in-person evaluation if the symptoms or condition worsens.  Duration of encounter: *** minutes.  Total time on encounter, as per AMA guidelines included both the face-to-face and non-face-to-face time personally spent by the physician and/or other qualified health care professional(s) on the day of the encounter (includes time in activities that require the physician or other qualified health care professional and does not include time in activities normally performed by clinical staff). Physician's time may include the following activities when performed: Preparing to see the patient (e.g., pre-charting review of records, searching for previously ordered imaging, lab work, and nerve conduction tests) Review of prior analgesic pharmacotherapies. Reviewing PMP Interpreting ordered tests (e.g., lab work, imaging, nerve conduction tests) Performing post-procedure evaluations, including interpretation of diagnostic procedures Obtaining and/or reviewing separately obtained history Performing a medically appropriate examination and/or evaluation Counseling and educating the patient/family/caregiver Ordering medications, tests, or procedures Referring and communicating with other health care professionals (when not separately reported) Documenting clinical  information in the electronic or other health record Independently interpreting results (not separately reported) and communicating results to the patient/ family/caregiver Care coordination (not separately reported)  Note by: Lamark Schue K Majour Frei, NP (TTS and AI technology used. I apologize for any typographical errors that were not detected and corrected.) Date: 10/31/2023; Time: 3:47 PM

## 2023-10-31 ENCOUNTER — Ambulatory Visit (HOSPITAL_BASED_OUTPATIENT_CLINIC_OR_DEPARTMENT_OTHER): Payer: 59 | Admitting: Nurse Practitioner

## 2023-10-31 DIAGNOSIS — G5702 Lesion of sciatic nerve, left lower limb: Secondary | ICD-10-CM

## 2023-10-31 DIAGNOSIS — M47816 Spondylosis without myelopathy or radiculopathy, lumbar region: Secondary | ICD-10-CM

## 2023-10-31 DIAGNOSIS — Z79899 Other long term (current) drug therapy: Secondary | ICD-10-CM

## 2023-10-31 DIAGNOSIS — G5793 Unspecified mononeuropathy of bilateral lower limbs: Secondary | ICD-10-CM

## 2023-10-31 DIAGNOSIS — G894 Chronic pain syndrome: Secondary | ICD-10-CM

## 2023-10-31 DIAGNOSIS — M5116 Intervertebral disc disorders with radiculopathy, lumbar region: Secondary | ICD-10-CM

## 2023-11-04 ENCOUNTER — Encounter: Payer: Self-pay | Admitting: Family Medicine

## 2023-11-04 ENCOUNTER — Other Ambulatory Visit: Payer: Self-pay

## 2023-11-04 DIAGNOSIS — E669 Obesity, unspecified: Secondary | ICD-10-CM

## 2023-11-04 MED ORDER — WEGOVY 2.4 MG/0.75ML ~~LOC~~ SOAJ: 2.4000 mg | SUBCUTANEOUS | 1 refills | Status: DC

## 2023-11-04 NOTE — Progress Notes (Unsigned)
 PROVIDER NOTE: Interpretation of information contained herein should be left to medically-trained personnel. Specific patient instructions are provided elsewhere under "Patient Instructions" section of medical record. This document was created in part using AI and STT-dictation technology, any transcriptional errors that may result from this process are unintentional.  Patient: Erica Walters  Service: E/M   PCP: Austine Lefort, MD  DOB: Aug 19, 1965  DOS: 11/05/2023  Provider: Cherylin Corrigan, NP  MRN: [id number]  Delivery: Face-to-face  Specialty: Interventional Pain Management  Type: Established Patient  Setting: Ambulatory outpatient facility  Specialty designation: 09  Referring Prov.: Austine Lefort, MD  Location: Outpatient office facility       HPI  Ms. Pennie Shaw, a 58 y.o. year old female, is here today because of her No primary diagnosis found.. Ms. Dunckel's primary complain today is low back pain (left side) radiate down to the left leg.   Pain Assessment: Severity of Chronic pain is reported as a 5 /10. Location: Back Lower/down left thigh to knee. Onset:  . Quality:  . Timing:  . Modifying factor(s): meds. Vitals:  height is 5\' 6"  (1.676 m) and weight is 196 lb (88.9 kg). Her temperature is 97 F (36.1 C) (abnormal). Her blood pressure is 125/90 (abnormal) and her pulse is 89. Her respiration is 16 and oxygen saturation is 97%.  BMI: Estimated body mass index is 31.64 kg/m as calculated from the following:   Height as of this encounter: 5\' 6"  (1.676 m).   Weight as of this encounter: 196 lb (88.9 kg). Last encounter: 09/05/2023.  Reason for encounter: both, medication management and post-procedure evaluation and assessment. No change in medical history since last visit.  Patient's pain is at baseline.  Patient continues multimodal pain regimen as prescribed.  States that it provides pain relief and improvement in functional status.   Procedure Type: Lumbar epidural steroid  injection (LESI) (interlaminar) #4    Laterality: Left   Level:  L3-4 Level.  Imaging: Fluoroscopic guidance         Anesthesia: Local anesthesia (1-2% Lidocaine ) DOS: 09/04/2023  Performed by: Cephus Collin, MD   Purpose: Diagnostic/Therapeutic Indications: Lumbar radicular pain of intraspinal etiology of more than 4 weeks that has failed to respond to conservative therapy and is severe enough to impact quality of life or function. 1. Lumbar radicular pain   2. Chronic radicular lumbar pain     NAS-11 Pain score:        Pre-procedure: 3 /10        Post-procedure: 3 /10    Effectiveness:  Initial hour after procedure: 100 % . Subsequent 4-6 hours post-procedure: 100 % . Analgesia past initial 6 hours: 0 % . Ongoing improvement:  Analgesic:  Ms. Puello, underwent a therapeutic lumbar epidural steroid injection (LESI) (Interlaminar) on 09/04/2023.  She reported 100% pain relief during the duration of the local anesthetic and continues to experience 0% pain relief since the procedure Function: Back to baseline ROM: Back to baseline  Pharmacotherapy Assessment  Analgesic: Tramadol  (Ultram ) 50 mg tablet every 12 hours as needed for pain. MME=20 Celebrex  100 mg capsule 2 times daily Nortriptyline  50 mg capsule daily at bedtime Lyrica  100 mg Capsule 2 times daily  Monitoring: [place] PMP: PDMP reviewed during this encounter.       Pharmacotherapy: No side-effects or adverse reactions reported. Compliance: No problems identified. Effectiveness: Clinically acceptable.  Lennis Rabon, RN  11/05/2023  9:25 AM  Sign when Signing Visit Nursing Pain Medication Assessment:  Safety  precautions to be maintained throughout the outpatient stay will include: orient to surroundings, keep bed in low position, maintain call bell within reach at all times, provide assistance with transfer out of bed and ambulation.  Medication Inspection Compliance: Pill count conducted under aseptic conditions, in  front of the patient. Neither the pills nor the bottle was removed from the patient's sight at any time. Once count was completed pills were immediately returned to the patient in their original bottle.  Medication: Tramadol  (Ultram ) Pill/Patch Count: 32 of 60 pills/patches remain Pill/Patch Appearance: Markings consistent with prescribed medication Bottle Appearance: Standard pharmacy container. Clearly labeled. Filled Date: 4 / 70 / 2025 Last Medication intake:  Today    No results found for: "CBDTHCR" No results found for: "D8THCCBX" No results found for: "D9THCCBX"  UDS:  Summary  Date Value Ref Range Status  01/22/2022 Note  Final    Comment:    ==================================================================== Compliance Drug Analysis, Ur ==================================================================== Test                             Result       Flag       Units  Drug Present and Declared for Prescription Verification   Tramadol                        >2336        EXPECTED   ng/mg creat   O-Desmethyltramadol            >2336        EXPECTED   ng/mg creat   N-Desmethyltramadol            >2336        EXPECTED   ng/mg creat    Source of tramadol  is a prescription medication. O-desmethyltramadol    and N-desmethyltramadol are expected metabolites of tramadol .    Pregabalin                      PRESENT      EXPECTED   Desmethylcyclobenzaprine       PRESENT      EXPECTED    Desmethylcyclobenzaprine is an expected metabolite of    cyclobenzaprine .    Nortriptyline                   PRESENT      EXPECTED    Nortriptyline  may be administered as a prescription drug; it is also    an expected metabolite of amitriptyline.  Drug Present not Declared for Prescription Verification   Ibuprofen                      PRESENT      UNEXPECTED ==================================================================== Test                      Result    Flag   Units      Ref Range    Creatinine              214              mg/dL      >=40 ==================================================================== Declared Medications:  The flagging and interpretation on this report are based on the  following declared medications.  Unexpected results may arise from  inaccuracies in the declared medications.   **Note: The testing scope of this panel includes these medications:   Cyclobenzaprine  (  Flexeril )  Nortriptyline  (Pamelor )  Pregabalin  (Lyrica )  Tramadol  (Ultram )   **Note: The testing scope of this panel does not include the  following reported medications:   Semaglutide  ==================================================================== For clinical consultation, please call [phone]. ====================================================================       ROS  Constitutional: Denies any fever or chills Gastrointestinal: No reported hemesis, hematochezia, vomiting, or acute GI distress Musculoskeletal: low back pain radiate down to left leg to posterior side down to the knee Neurological: No reported episodes of acute onset apraxia, aphasia, dysarthria, agnosia, amnesia, paralysis, loss of coordination, or loss of consciousness  Medication Review  Semaglutide -Weight Management, celecoxib , cyclobenzaprine , diclofenac , losartan , nortriptyline , pregabalin , and traMADol   History Review  Allergy: Ms. Jarnagin has no known allergies. Drug: Ms. Leviton  reports no history of drug use. Alcohol:  reports current alcohol use of about 3.0 standard drinks of alcohol per week. Tobacco:  reports that she has been smoking cigarettes. She has a 13 pack-year smoking history. She has never used smokeless tobacco. Social: Ms. Martine  reports that she has been smoking cigarettes. She has a 13 pack-year smoking history. She has never used smokeless tobacco. She reports current alcohol use of about 3.0 standard drinks of alcohol per week. She reports that she does  not use drugs. Medical:  has a past medical history of Allergy, Arthritis, DJD (degenerative joint disease), and Hyperlipidemia. Surgical: Ms. Cresswell  has a past surgical history that includes Total abdominal hysterectomy w/ bilateral salpingoophorectomy; Colonoscopy (12/04/2017); and Colonoscopy (2020). Family: family history includes Colon cancer in her paternal grandfather; Colon polyps (age of onset: 87) in her paternal grandfather.  Laboratory Chemistry Profile   Renal Lab Results  Component Value Date   BUN 17 02/15/2023   CREATININE 0.66 02/15/2023   BCR SEE NOTE: 02/15/2023   GFRAA 97 01/23/2019   GFRNONAA 84 01/23/2019    Hepatic Lab Results  Component Value Date   AST 32 02/15/2023   ALT 44 (H) 02/15/2023   ALBUMIN 4.1 07/19/2015   ALKPHOS 83 07/19/2015   LIPASE 30 07/19/2015    Electrolytes Lab Results  Component Value Date   NA 141 02/15/2023   K 4.5 02/15/2023   CL 105 02/15/2023   CALCIUM  9.3 02/15/2023    Bone Lab Results  Component Value Date   VD25OH 31 01/23/2019    Inflammation (CRP: Acute Phase) (ESR: Chronic Phase) Lab Results  Component Value Date   CRP 4.7 12/25/2018   ESRSEDRATE 14 12/25/2018         Note: Above Lab results reviewed.  Recent Imaging Review  DG PAIN CLINIC C-ARM 1-60 MIN NO REPORT Fluoro was used, but no Radiologist interpretation will be provided.  Please refer to "NOTES" tab for provider progress note. Note: Reviewed         Physical Exam  General appearance: Well nourished, well developed, and well hydrated. In no apparent acute distress Mental status: Alert, oriented x 3 (person, place, & time)       Respiratory: No evidence of acute respiratory distress Eyes: PERLA Vitals: BP (!) 125/90   Pulse 89   Temp (!) 97 F (36.1 C)   Resp 16   Ht 5\' 6"  (1.676 m)   Wt 196 lb (88.9 kg)   SpO2 97%   BMI 31.64 kg/m  BMI: Estimated body mass index is 31.64 kg/m as calculated from the following:   Height as of this  encounter: 5\' 6"  (1.676 m).   Weight as of this encounter: 196 lb (88.9 kg).  Ideal: Ideal body weight: 59.3 kg (130 lb 11.7 oz) Adjusted ideal body weight: 71.1 kg (156 lb 13.4 oz)  Assessment   Diagnosis Status  1. Lumbar disc herniation with radiculopathy   2. Chronic pain syndrome   3. Neuropathy involving both lower extremities   4. Lumbar facet arthropathy   5. Cervical radiculopathy   6. Back pain, unspecified back location, unspecified back pain laterality, unspecified chronicity    Controlled Controlled Controlled   Plan of Care  Assessment and Plan We will continue on current medication regimen.  Prescribing drug monitoring (PDMP) consistent with prescribed medication. Routine UDS ordered today.   Pharmacotherapy (Medications Ordered): Meds ordered this encounter  Medications   traMADol  (ULTRAM ) 50 MG tablet    Sig: Take 1 tablet (50 mg total) by mouth every 12 (twelve) hours as needed.    Dispense:  60 tablet    Refill:  5   pregabalin  (LYRICA ) 100 MG capsule    Sig: Take 1 capsule (100 mg total) by mouth 2 (two) times daily.    Dispense:  60 capsule    Refill:  5    Fill one day early if pharmacy is closed on scheduled refill date. May substitute for generic if available.   nortriptyline  (PAMELOR ) 50 MG capsule    Sig: Take 1 capsule (50 mg total) by mouth at bedtime.    Dispense:  30 capsule    Refill:  5   celecoxib  (CELEBREX ) 100 MG capsule    Sig: Take 1 capsule (100 mg total) by mouth 2 (two) times daily.    Dispense:  60 capsule    Refill:  5   Orders:  Orders Placed This Encounter  Procedures   ToxASSURE Select 13 (MW), Urine    Volume: 30 ml(s). Minimum 3 ml of urine is needed. Document temperature of fresh sample. Indications: Long term (current) use of opiate analgesic (V78.469)    Release to patient:   Immediate   Follow-up plan:   Return in about 3 months (around 02/05/2024) for (F2F), (MM), Marthe Slain NP.    Recent Visits Date Type  Provider Dept  09/04/23 Procedure visit Cephus Collin, MD [facility]  Showing recent visits within past 90 days and meeting all other requirements Today's Visits Date Type Provider Dept  11/05/23 Office Visit Nataly Pacifico K, NP [facility]  Showing today's visits and meeting all other requirements Future Appointments No visits were found meeting these conditions. Showing future appointments within next 90 days and meeting all other requirements  I discussed the assessment and treatment plan with the patient. The patient was provided an opportunity to ask questions and all were answered. The patient agreed with the plan and demonstrated an understanding of the instructions.  Patient advised to call back or seek an in-person evaluation if the symptoms or condition worsens.  Duration of encounter: 30 minutes.  Total time on encounter, as per AMA guidelines included both the face-to-face and non-face-to-face time personally spent by the physician and/or other qualified health care professional(s) on the day of the encounter (includes time in activities that require the physician or other qualified health care professional and does not include time in activities normally performed by clinical staff). Physician's time may include the following activities when performed: Preparing to see the patient (e.g., pre-charting review of records, searching for previously ordered imaging, lab work, and nerve conduction tests) Review of prior analgesic pharmacotherapies. Reviewing PMP Interpreting ordered tests (e.g., lab work, imaging, nerve conduction  tests) Performing post-procedure evaluations, including interpretation of diagnostic procedures Obtaining and/or reviewing separately obtained history Performing a medically appropriate examination and/or evaluation Counseling and educating the patient/family/caregiver Ordering medications, tests, or procedures Referring and communicating  with other health care professionals (when not separately reported) Documenting clinical information in the electronic or other health record Independently interpreting results (not separately reported) and communicating results to the patient/ family/caregiver Care coordination (not separately reported)  Note by: Ladale Sherburn K Tkai Large, NP (TTS and AI technology used. I apologize for any typographical errors that were not detected and corrected.) Date: 11/05/2023; Time: 10:24 AM

## 2023-11-05 ENCOUNTER — Encounter: Payer: Self-pay | Admitting: Nurse Practitioner

## 2023-11-05 ENCOUNTER — Ambulatory Visit: Attending: Nurse Practitioner | Admitting: Nurse Practitioner

## 2023-11-05 DIAGNOSIS — M5412 Radiculopathy, cervical region: Secondary | ICD-10-CM

## 2023-11-05 DIAGNOSIS — M47816 Spondylosis without myelopathy or radiculopathy, lumbar region: Secondary | ICD-10-CM

## 2023-11-05 DIAGNOSIS — G894 Chronic pain syndrome: Secondary | ICD-10-CM

## 2023-11-05 DIAGNOSIS — M549 Dorsalgia, unspecified: Secondary | ICD-10-CM | POA: Diagnosis present

## 2023-11-05 DIAGNOSIS — G5793 Unspecified mononeuropathy of bilateral lower limbs: Secondary | ICD-10-CM

## 2023-11-05 DIAGNOSIS — M5116 Intervertebral disc disorders with radiculopathy, lumbar region: Secondary | ICD-10-CM

## 2023-11-05 MED ORDER — PREGABALIN 100 MG PO CAPS: 100.0000 mg | ORAL_CAPSULE | Freq: Two times a day (BID) | ORAL | 5 refills | Status: DC

## 2023-11-05 MED ORDER — CELECOXIB 100 MG PO CAPS: 100.0000 mg | ORAL_CAPSULE | Freq: Two times a day (BID) | ORAL | 5 refills | Status: DC

## 2023-11-05 MED ORDER — NORTRIPTYLINE HCL 50 MG PO CAPS: 50.0000 mg | ORAL_CAPSULE | Freq: Every day | ORAL | 5 refills | Status: DC

## 2023-11-05 MED ORDER — TRAMADOL HCL 50 MG PO TABS: 50.0000 mg | ORAL_TABLET | Freq: Two times a day (BID) | ORAL | 5 refills | Status: DC | PRN

## 2023-11-05 NOTE — Progress Notes (Signed)
 Nursing Pain Medication Assessment:  Safety precautions to be maintained throughout the outpatient stay will include: orient to surroundings, keep bed in low position, maintain call bell within reach at all times, provide assistance with transfer out of bed and ambulation.  Medication Inspection Compliance: Pill count conducted under aseptic conditions, in front of the patient. Neither the pills nor the bottle was removed from the patient's sight at any time. Once count was completed pills were immediately returned to the patient in their original bottle.  Medication: Tramadol  (Ultram ) Pill/Patch Count: 32 of 60 pills/patches remain Pill/Patch Appearance: Markings consistent with prescribed medication Bottle Appearance: Standard pharmacy container. Clearly labeled. Filled Date: 4 / 16 / 2025 Last Medication intake:  Today

## 2023-11-09 LAB — TOXASSURE SELECT 13 (MW), URINE

## 2023-12-30 ENCOUNTER — Encounter: Payer: Self-pay | Admitting: Family Medicine

## 2023-12-30 ENCOUNTER — Other Ambulatory Visit: Payer: Self-pay

## 2023-12-30 DIAGNOSIS — E669 Obesity, unspecified: Secondary | ICD-10-CM

## 2023-12-30 MED ORDER — WEGOVY 2.4 MG/0.75ML ~~LOC~~ SOAJ: 2.4000 mg | SUBCUTANEOUS | 3 refills | Status: DC

## 2024-01-28 ENCOUNTER — Other Ambulatory Visit (HOSPITAL_COMMUNITY): Payer: Self-pay

## 2024-01-28 ENCOUNTER — Telehealth: Payer: Self-pay

## 2024-01-28 NOTE — Telephone Encounter (Signed)
 Pharmacy Patient Advocate Encounter   Received notification from Onbase that prior authorization for Wegovy  2.4MG /0.75ML auto-injectors  is required/requested.   Insurance verification completed.   The patient is insured through [facility] .   Per test claim: PA required; PA submitted to above mentioned insurance via CoverMyMeds Key/confirmation #/EOC [id number] Status is pending

## 2024-01-28 NOTE — Telephone Encounter (Signed)
 Pharmacy Patient Advocate Encounter  Received notification from [employer] that Prior Authorization for Wegovy  2.4MG /0.75ML auto-injectors  has been APPROVED from 01/28/24 to 07/30/24   PA #/Case ID/Reference #: [id number]  *unable to get a copay due to a rejection of the patient has to go to [facility]. I have left a VM for [facility] to process prescription.

## 2024-01-30 ENCOUNTER — Encounter: Payer: Self-pay | Admitting: Nurse Practitioner

## 2024-01-30 ENCOUNTER — Ambulatory Visit: Attending: Nurse Practitioner | Admitting: Nurse Practitioner

## 2024-01-30 DIAGNOSIS — M5416 Radiculopathy, lumbar region: Secondary | ICD-10-CM | POA: Diagnosis present

## 2024-01-30 DIAGNOSIS — M47816 Spondylosis without myelopathy or radiculopathy, lumbar region: Secondary | ICD-10-CM | POA: Diagnosis present

## 2024-01-30 DIAGNOSIS — G5793 Unspecified mononeuropathy of bilateral lower limbs: Secondary | ICD-10-CM | POA: Insufficient documentation

## 2024-01-30 DIAGNOSIS — G8929 Other chronic pain: Secondary | ICD-10-CM | POA: Insufficient documentation

## 2024-01-30 DIAGNOSIS — M5116 Intervertebral disc disorders with radiculopathy, lumbar region: Secondary | ICD-10-CM | POA: Diagnosis present

## 2024-01-30 DIAGNOSIS — G894 Chronic pain syndrome: Secondary | ICD-10-CM | POA: Diagnosis present

## 2024-01-30 MED ORDER — PREGABALIN 100 MG PO CAPS: 100.0000 mg | ORAL_CAPSULE | Freq: Three times a day (TID) | ORAL | 5 refills | Status: DC

## 2024-01-30 MED ORDER — CYCLOBENZAPRINE HCL 5 MG PO TABS: 5.0000 mg | ORAL_TABLET | Freq: Three times a day (TID) | ORAL | 2 refills | Status: AC | PRN

## 2024-01-30 MED ORDER — DICLOFENAC SODIUM 50 MG PO TBEC: 50.0000 mg | DELAYED_RELEASE_TABLET | Freq: Two times a day (BID) | ORAL | 1 refills | Status: DC

## 2024-01-30 MED ORDER — TRAMADOL HCL 50 MG PO TABS: 50.0000 mg | ORAL_TABLET | Freq: Two times a day (BID) | ORAL | 5 refills | Status: DC | PRN

## 2024-01-30 NOTE — Progress Notes (Deleted)
 Nursing Pain Medication Assessment:  Safety precautions to be maintained throughout the outpatient stay will include: orient to surroundings, keep bed in low position, maintain call bell within reach at all times, provide assistance with transfer out of bed and ambulation.  Medication Inspection Compliance: Erica Walters did not comply with our request to bring her pills to be counted. She was reminded that bringing the medication bottles, even when empty, is a requirement.  Medication: None brought in. Pill/Patch Count: None available to be counted. Bottle Appearance: No container available. Did not bring bottle(s) to appointment. Filled Date: N/A Last Medication intake:  Yesterday

## 2024-01-30 NOTE — Progress Notes (Signed)
 Nursing Pain Medication Assessment:  Safety precautions to be maintained throughout the outpatient stay will include: orient to surroundings, keep bed in low position, maintain call bell within reach at all times, provide assistance with transfer out of bed and ambulation.  Medication Inspection Compliance: Pill count conducted under aseptic conditions, in front of the patient. Neither the pills nor the bottle was removed from the patient's sight at any time. Once count was completed pills were immediately returned to the patient in their original bottle.  Medication: Tramadol  (Ultram ) Pill/Patch Count: 48 of 60 pills/patches remain Pill/Patch Appearance: Markings consistent with prescribed medication Bottle Appearance: Standard pharmacy container. Clearly labeled. Filled Date: 07 / 29 / 2025 Last Medication intake:  Yesterday

## 2024-01-30 NOTE — Progress Notes (Signed)
 PROVIDER NOTE: Interpretation of information contained herein should be left to medically-trained personnel. Specific patient instructions are provided elsewhere under Patient Instructions section of medical record. This document was created in part using AI and STT-dictation technology, any transcriptional errors that may result from this process are unintentional.  Patient: Erica Walters  Service: E/M   PCP: Duanne Butler DASEN, MD  DOB: 02-01-1966  DOS: 01/30/2024  Provider: Emmy MARLA Blanch, NP  MRN: [id number]  Delivery: Face-to-face  Specialty: Interventional Pain Management  Type: Established Patient  Setting: Ambulatory outpatient facility  Specialty designation: 09  Referring Prov.: Duanne Butler DASEN, MD  Location: Outpatient office facility       History of present illness (HPI) Erica Walters, a 58 y.o. year old female, is here today because of her No primary diagnosis found.. Erica Walters's primary complain today is Back Pain (Lower left )  Pertinent problems: Erica Walters has Cervical radiculopathy; DDD (degenerative disc disease), cervical; Bilateral plantar fasciitis; and Chronic pain syndrome on their pertinent problem list.  Pain Assessment: Severity of Chronic pain is reported as a 7 /10. Location: Back Lower, Left/into buttocks and groin on the left side, burning into left leg. Onset: More than a month ago. Quality: Discomfort, Sharp, Radiating, Sore, Aching, Constant, Nagging, Tiring, Burning. Timing: Constant. Modifying factor(s): nothing currently, pain meds are taking the edge off.. Vitals:  height is 5' 6 (1.676 m) and weight is 196 lb (88.9 kg). Her temporal temperature is 97.1 F (36.2 C) (abnormal). Her blood pressure is 114/83 and her pulse is 96. Her respiration is 16 and oxygen saturation is 98%.  BMI: Estimated body mass index is 31.64 kg/m as calculated from the following:   Height as of this encounter: 5' 6 (1.676 m).   Weight as of this encounter: 196 lb (88.9  kg).  Last encounter: 11/05/2023. Last procedure: Visit date not found.  Reason for encounter: medication management. No change in medical history since last visit.  Patient's pain is at baseline.  Patient continues multimodal pain regimen as prescribed.  States that it provides pain relief and improvement in functional status.   Pharmacotherapy Assessment   Tramadol  (Ultram ) 50 mg tablet every 12 hours as needed for pain. MME=20 Flexeril  5 mg tablet 3 times daily as needed for muscle spasm Lyrica  100 mg Capsule 3 times daily for neuropathic pain Diclofenac  50 Mg 2 times daily Monitoring: [place] PMP: PDMP reviewed during this encounter.       Pharmacotherapy: No side-effects or adverse reactions reported. Compliance: No problems identified. Effectiveness: Clinically acceptable.  Jakie Chrissie MATSU, RN  01/30/2024 10:28 AM  Sign when Signing Visit Nursing Pain Medication Assessment:  Safety precautions to be maintained throughout the outpatient stay will include: orient to surroundings, keep bed in low position, maintain call bell within reach at all times, provide assistance with transfer out of bed and ambulation.  Medication Inspection Compliance: Pill count conducted under aseptic conditions, in front of the patient. Neither the pills nor the bottle was removed from the patient's sight at any time. Once count was completed pills were immediately returned to the patient in their original bottle.  Medication: Tramadol  (Ultram ) Pill/Patch Count: 48 of 60 pills/patches remain Pill/Patch Appearance: Markings consistent with prescribed medication Bottle Appearance: Standard pharmacy container. Clearly labeled. Filled Date: 07 / 29 / 2025 Last Medication intake:  Yesterday    UDS:  Summary  Date Value Ref Range Status  11/05/2023 FINAL  Final    Comment:    ====================================================================  [employer] Select 13  (MW) ==================================================================== Test                             Result       Flag       Units  Drug Present and Declared for Prescription Verification   Tramadol                        >4098        EXPECTED   ng/mg creat   O-Desmethyltramadol            >4098        EXPECTED   ng/mg creat   N-Desmethyltramadol            >4098        EXPECTED   ng/mg creat    Source of tramadol  is a prescription medication. O-desmethyltramadol    and N-desmethyltramadol are expected metabolites of tramadol .  ==================================================================== Test                      Result    Flag   Units      Ref Range   Creatinine              122              mg/dL      >=79 ==================================================================== Declared Medications:  The flagging and interpretation on this report are based on the  following declared medications.  Unexpected results may arise from  inaccuracies in the declared medications.   **Note: The testing scope of this panel includes these medications:   Tramadol  (Ultram )   **Note: The testing scope of this panel does not include the  following reported medications:   Celecoxib  (Celebrex )  Cyclobenzaprine  (Flexeril )  Diclofenac  (Voltaren )  Losartan  (Cozaar )  Nortriptyline  (Pamelor )  Pregabalin  (Lyrica )  Semaglutide  (Wegovy ) ==================================================================== For clinical consultation, please call [phone]. ====================================================================     No results found for: CBDTHCR No results found for: D8THCCBX No results found for: D9THCCBX  ROS  Constitutional: Denies any fever or chills Gastrointestinal: No reported hemesis, hematochezia, vomiting, or acute GI distress Musculoskeletal: Denies any acute onset joint swelling, redness, loss of ROM, or weakness Neurological: No reported episodes of  acute onset apraxia, aphasia, dysarthria, agnosia, amnesia, paralysis, loss of coordination, or loss of consciousness  Medication Review  celecoxib , cyclobenzaprine , diclofenac , losartan , nortriptyline , pregabalin , semaglutide -weight management, and traMADol   History Review  Allergy: Erica Walters has no known allergies. Drug: Erica Walters  reports no history of drug use. Alcohol:  reports current alcohol use of about 3.0 standard drinks of alcohol per week. Tobacco:  reports that she has been smoking cigarettes. She has a 13 pack-year smoking history. She has never used smokeless tobacco. Social: Erica Walters  reports that she has been smoking cigarettes. She has a 13 pack-year smoking history. She has never used smokeless tobacco. She reports current alcohol use of about 3.0 standard drinks of alcohol per week. She reports that she does not use drugs. Medical:  has a past medical history of Allergy, Arthritis, DJD (degenerative joint disease), and Hyperlipidemia. Surgical: Erica Walters  has a past surgical history that includes Total abdominal hysterectomy w/ bilateral salpingoophorectomy; Colonoscopy (12/04/2017); and Colonoscopy (2020). Family: family history includes Colon cancer in her paternal grandfather; Colon polyps (age of onset: 60) in her paternal grandfather.  Laboratory Chemistry Profile   Renal Lab Results  Component Value Date   BUN 17 02/15/2023   CREATININE 0.66 02/15/2023   BCR SEE NOTE: 02/15/2023   GFRAA 97 01/23/2019   GFRNONAA 84 01/23/2019    Hepatic Lab Results  Component Value Date   AST 32 02/15/2023   ALT 44 (H) 02/15/2023   ALBUMIN 4.1 07/19/2015   ALKPHOS 83 07/19/2015   LIPASE 30 07/19/2015    Electrolytes Lab Results  Component Value Date   NA 141 02/15/2023   K 4.5 02/15/2023   CL 105 02/15/2023   CALCIUM  9.3 02/15/2023    Bone Lab Results  Component Value Date   VD25OH 31 01/23/2019    Inflammation (CRP: Acute Phase) (ESR: Chronic  Phase) Lab Results  Component Value Date   CRP 4.7 12/25/2018   ESRSEDRATE 14 12/25/2018         Note: Above Lab results reviewed.  Recent Imaging Review  DG PAIN CLINIC C-ARM 1-60 MIN NO REPORT Fluoro was used, but no Radiologist interpretation will be provided.  Please refer to NOTES tab for provider progress note. Note: Reviewed        Physical Exam  Vitals: BP 114/83 (BP Location: Left Arm, Patient Position: Sitting, Cuff Size: Large)   Pulse 96   Temp (!) 97.1 F (36.2 C) (Temporal)   Resp 16   Ht 5' 6 (1.676 m)   Wt 196 lb (88.9 kg)   SpO2 98%   BMI 31.64 kg/m  BMI: Estimated body mass index is 31.64 kg/m as calculated from the following:   Height as of this encounter: 5' 6 (1.676 m).   Weight as of this encounter: 196 lb (88.9 kg). Ideal: Ideal body weight: 59.3 kg (130 lb 11.7 oz) Adjusted ideal body weight: 71.1 kg (156 lb 13.4 oz) General appearance: Well nourished, well developed, and well hydrated. In no apparent acute distress Mental status: Alert, oriented x 3 (person, place, & time)       Respiratory: No evidence of acute respiratory distress Eyes: PERLA   Assessment   Diagnosis Status  1. Lumbar disc herniation with radiculopathy   2. Chronic pain syndrome   3. Neuropathy involving both lower extremities   4. Lumbar facet arthropathy   5. Chronic radicular lumbar pain (left)    Controlled Controlled Controlled   Updated Problems: No problems updated.  Plan of Care  Problem-specific:  Assessment and Plan We will continue on current medication regimen.  Prescribing drug monitoring (PDMP) reviewed; findings consistent with the use of prescribed medication and no evidence of narcotic misuse or abuse.  Urine drug screening (UDS) up-to-date.  Schedule follow-up in 90 days for medication management.  Erica Walters has a current medication list which includes the following long-term medication(s): losartan , nortriptyline , and  pregabalin .  Pharmacotherapy (Medications Ordered): Meds ordered this encounter  Medications   pregabalin  (LYRICA ) 100 MG capsule    Sig: Take 1 capsule (100 mg total) by mouth 3 (three) times daily.    Dispense:  90 capsule    Refill:  5    Fill one day early if pharmacy is closed on scheduled refill date. May substitute for generic if available.   cyclobenzaprine  (FLEXERIL ) 5 MG tablet    Sig: Take 1 tablet (5 mg total) by mouth 3 (three) times daily as needed for muscle spasms.    Dispense:  90 tablet    Refill:  2    Do not place this medication, or any other prescription from our practice, on Automatic Refill. Patient may  have prescription filled one day early if pharmacy is closed on scheduled refill date.   diclofenac  (VOLTAREN ) 50 MG EC tablet    Sig: Take 1 tablet (50 mg total) by mouth 2 (two) times daily.    Dispense:  60 tablet    Refill:  1   traMADol  (ULTRAM ) 50 MG tablet    Sig: Take 1 tablet (50 mg total) by mouth every 12 (twelve) hours as needed.    Dispense:  60 tablet    Refill:  5   Orders:  No orders of the defined types were placed in this encounter.       Return in about 3 months (around 05/01/2024) for (F2F), (MM), Emmy Blanch NP.    Recent Visits Date Type Provider Dept  11/05/23 Office Visit Shaquinta Peruski K, NP [facility]  Showing recent visits within past 90 days and meeting all other requirements Today's Visits Date Type Provider Dept  01/30/24 Office Visit Naviah Belfield K, NP [facility]  Showing today's visits and meeting all other requirements Future Appointments No visits were found meeting these conditions. Showing future appointments within next 90 days and meeting all other requirements  I discussed the assessment and treatment plan with the patient. The patient was provided an opportunity to ask questions and all were answered. The patient agreed with the plan and demonstrated an understanding of the  instructions.  Patient advised to call back or seek an in-person evaluation if the symptoms or condition worsens.  Duration of encounter: 30 minutes.  Total time on encounter, as per AMA guidelines included both the face-to-face and non-face-to-face time personally spent by the physician and/or other qualified health care professional(s) on the day of the encounter (includes time in activities that require the physician or other qualified health care professional and does not include time in activities normally performed by clinical staff). Physician's time may include the following activities when performed: Preparing to see the patient (e.g., pre-charting review of records, searching for previously ordered imaging, lab work, and nerve conduction tests) Review of prior analgesic pharmacotherapies. Reviewing PMP Interpreting ordered tests (e.g., lab work, imaging, nerve conduction tests) Performing post-procedure evaluations, including interpretation of diagnostic procedures Obtaining and/or reviewing separately obtained history Performing a medically appropriate examination and/or evaluation Counseling and educating the patient/family/caregiver Ordering medications, tests, or procedures Referring and communicating with other health care professionals (when not separately reported) Documenting clinical information in the electronic or other health record Independently interpreting results (not separately reported) and communicating results to the patient/ family/caregiver Care coordination (not separately reported)  Note by: Wyatte Dames K Jearldean Gutt, NP (TTS and AI technology used. I apologize for any typographical errors that were not detected and corrected.) Date: 01/30/2024; Time: 10:45 AM

## 2024-02-05 ENCOUNTER — Encounter: Admitting: Nurse Practitioner

## 2024-02-19 ENCOUNTER — Telehealth: Payer: Self-pay

## 2024-02-19 ENCOUNTER — Telehealth: Payer: Self-pay | Admitting: Nurse Practitioner

## 2024-02-19 NOTE — Telephone Encounter (Signed)
 Message sent to [facility]

## 2024-02-19 NOTE — Telephone Encounter (Signed)
 Patient states that she may just have to wait until November, and just take the medication BID. She lives out by [place], and she will need to find the time to get here.

## 2024-02-19 NOTE — Telephone Encounter (Signed)
 Reviewed patient chart, Tramadol  script was decreased from TID to BID in 04/2023. Patient states, that since that visit she thought that she asked Dr. Marcelino to increase the medication back to TID for days that she had increased pain. Patient states that she thought that the medication had been increased back to the TID, so she had been taking accordingly. Patient also reports that she had not been taking the medication three times daily, just only as needed, however she is currently out of medication 2 days early.  Patient is asking for an early fill with the frequency increase.    Spoke with patient, she will need to come in for a visit to discuss with provider any medication change. Patient verbalizes understanding.

## 2024-02-19 NOTE — Telephone Encounter (Signed)
 She had asked for her tramadol  to be increased to 3 times a day as it was before. The pharmacy didn't have it so now she is out of tramadol  and wants us  to call her out a new script

## 2024-02-19 NOTE — Telephone Encounter (Signed)
 Message sent to [facility]

## 2024-02-19 NOTE — Telephone Encounter (Signed)
 PT called stated that her Tramadol  is written for to take one time a day. It was suppose to be for twice a day. Please give patient a call. TY

## 2024-02-19 NOTE — Telephone Encounter (Signed)
 Erica Walters, this patient called and said that her medication was decreased to Q12hours but she forgot to look at the bottle and took them tid like it was before and now is out.

## 2024-04-20 ENCOUNTER — Encounter: Payer: Self-pay | Admitting: Family Medicine

## 2024-04-21 ENCOUNTER — Other Ambulatory Visit: Payer: Self-pay

## 2024-04-21 DIAGNOSIS — E669 Obesity, unspecified: Secondary | ICD-10-CM

## 2024-04-21 MED ORDER — WEGOVY 2.4 MG/0.75ML ~~LOC~~ SOAJ: 2.4000 mg | SUBCUTANEOUS | 3 refills | Status: AC

## 2024-04-27 ENCOUNTER — Other Ambulatory Visit: Payer: Self-pay | Admitting: Nurse Practitioner

## 2024-04-27 DIAGNOSIS — G894 Chronic pain syndrome: Secondary | ICD-10-CM

## 2024-04-27 DIAGNOSIS — M5412 Radiculopathy, cervical region: Secondary | ICD-10-CM

## 2024-04-27 DIAGNOSIS — G5793 Unspecified mononeuropathy of bilateral lower limbs: Secondary | ICD-10-CM

## 2024-04-27 DIAGNOSIS — M549 Dorsalgia, unspecified: Secondary | ICD-10-CM

## 2024-04-28 ENCOUNTER — Encounter: Payer: Self-pay | Admitting: Family Medicine

## 2024-04-29 ENCOUNTER — Other Ambulatory Visit: Payer: Self-pay

## 2024-04-29 DIAGNOSIS — M5116 Intervertebral disc disorders with radiculopathy, lumbar region: Secondary | ICD-10-CM

## 2024-04-29 DIAGNOSIS — M47816 Spondylosis without myelopathy or radiculopathy, lumbar region: Secondary | ICD-10-CM

## 2024-04-29 DIAGNOSIS — M5412 Radiculopathy, cervical region: Secondary | ICD-10-CM

## 2024-04-29 DIAGNOSIS — M5030 Other cervical disc degeneration, unspecified cervical region: Secondary | ICD-10-CM

## 2024-04-29 DIAGNOSIS — G5793 Unspecified mononeuropathy of bilateral lower limbs: Secondary | ICD-10-CM

## 2024-04-30 ENCOUNTER — Other Ambulatory Visit: Payer: Self-pay | Admitting: Family Medicine

## 2024-04-30 ENCOUNTER — Encounter: Payer: Self-pay | Admitting: Nurse Practitioner

## 2024-04-30 ENCOUNTER — Other Ambulatory Visit: Payer: Self-pay | Admitting: *Deleted

## 2024-04-30 ENCOUNTER — Ambulatory Visit: Attending: Nurse Practitioner | Admitting: Nurse Practitioner

## 2024-04-30 VITALS — BP 136/78 | HR 109 | Temp 97.8°F | Resp 18 | Ht 66.0 in | Wt 196.0 lb

## 2024-04-30 DIAGNOSIS — G5793 Unspecified mononeuropathy of bilateral lower limbs: Secondary | ICD-10-CM

## 2024-04-30 DIAGNOSIS — M5412 Radiculopathy, cervical region: Secondary | ICD-10-CM | POA: Diagnosis present

## 2024-04-30 DIAGNOSIS — M549 Dorsalgia, unspecified: Secondary | ICD-10-CM

## 2024-04-30 DIAGNOSIS — G894 Chronic pain syndrome: Secondary | ICD-10-CM | POA: Insufficient documentation

## 2024-04-30 DIAGNOSIS — Z79899 Other long term (current) drug therapy: Secondary | ICD-10-CM | POA: Diagnosis present

## 2024-04-30 DIAGNOSIS — M47816 Spondylosis without myelopathy or radiculopathy, lumbar region: Secondary | ICD-10-CM | POA: Insufficient documentation

## 2024-04-30 DIAGNOSIS — M5116 Intervertebral disc disorders with radiculopathy, lumbar region: Secondary | ICD-10-CM | POA: Diagnosis present

## 2024-04-30 MED ORDER — TRAMADOL HCL 50 MG PO TABS: 50.0000 mg | ORAL_TABLET | Freq: Three times a day (TID) | ORAL | 5 refills | Status: AC | PRN

## 2024-04-30 MED ORDER — NORTRIPTYLINE HCL 50 MG PO CAPS: 50.0000 mg | ORAL_CAPSULE | Freq: Every day | ORAL | 5 refills | Status: AC

## 2024-04-30 NOTE — Progress Notes (Signed)
 Nursing Pain Medication Assessment:  Safety precautions to be maintained throughout the outpatient stay will include: orient to surroundings, keep bed in low position, maintain call bell within reach at all times, provide assistance with transfer out of bed and ambulation.  Medication Inspection Compliance: Pill count conducted under aseptic conditions, in front of the patient. Neither the pills nor the bottle was removed from the patient's sight at any time. Once count was completed pills were immediately returned to the patient in their original bottle.  Medication: Tramadol  (Ultram ) Pill/Patch Count: 51 of 60 pills/patches remain Pill/Patch Appearance: Markings consistent with prescribed medication Bottle Appearance: Standard pharmacy container. Clearly labeled. Filled Date: 53 / 27 / 2025 Last Medication intake:  Today

## 2024-04-30 NOTE — Progress Notes (Signed)
 PROVIDER NOTE: Interpretation of information contained herein should be left to medically-trained personnel. Specific patient instructions are provided elsewhere under Patient Instructions section of medical record. This document was created in part using AI and STT-dictation technology, any transcriptional errors that may result from this process are unintentional.  Patient: Erica Walters  Service: E/M   PCP: Erica Butler DASEN, MD  DOB: Feb 14, 1966  DOS: 04/30/2024  Provider: Emmy MARLA Blanch, NP  MRN: [id number]  Delivery: Face-to-face  Specialty: Interventional Pain Management  Type: Established Patient  Setting: Ambulatory outpatient facility  Specialty designation: 09  Referring Prov.: Erica Butler DASEN, MD  Location: Outpatient office facility       History of present illness (HPI) Ms. Erica Walters, a 58 y.o. year old female, is here today because of her Back pain. Ms. Erica Walters primary complain today is Back Pain  Pertinent problems: Ms. Erica Walters has Cervical radiculopathy; DDD (degenerative disc disease), cervical; Bilateral plantar fasciitis; and Chronic pain syndrome on their pertinent problem list.  Pain Assessment: Severity of Chronic pain is reported as a 7 /10. Location: Back Lower/Left hip and groin. Onset: More than a month ago. Quality: Erica Walters, Aching. Timing: Intermittent. Modifying factor(s): Rest. Vitals:  height is 5' 6 (1.676 m) and weight is 196 lb (88.9 kg). Her temporal temperature is 97.8 F (36.6 C). Her blood pressure is 136/78 and her pulse is 109 (abnormal). Her respiration is 18 and oxygen saturation is 97%.  BMI: Estimated body mass index is 31.64 kg/m as calculated from the following:   Height as of this encounter: 5' 6 (1.676 m).   Weight as of this encounter: 196 lb (88.9 kg).  Last encounter: 01/30/2024. Last procedure: Visit date not found.  Reason for encounter: medication management. No change in medical history since last visit.  Patient's pain is at  baseline.  Patient continues multimodal pain regimen as prescribed.  States that it provides pain relief and improvement in functional status.   Discussed the use of AI scribe software for clinical note transcription with the patient, who gave verbal consent to proceed.  History of Present Illness   Erica Walters is a 58 year old female who presents for pain management. She experiences chronic pain primarily located in her lower back, radiating into her groin area and down to her knee. 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 She is frustrated with her current employment due to the pain it causes. She is currently taking tramadol , pregabalin , nortriptyline , and occasionally uses Flexeril  and Celebrex  for pain management. She wants the option to take tramadol  three times a day, especially after unloading trucks, although she does not plan to use it that frequently every day. She has four refills remaining for pregabalin  and is uncertain about her refill status for Celebrex  and Flexeril .  She has previously received injections for pain relief, but they have not been effective and are costly. Her husband supports her decision to discontinue them due to the financial burden.  No side effects from tramadol  or nortriptyline .     Pharmacotherapy Assessment   Tramadol  (Ultram ) 50 mg tablet every 12 hours as needed for pain. MME=20 Nortriptyline  50 mg capsule at bedtime Monitoring: [place] PMP: PDMP reviewed during this encounter.       Pharmacotherapy: No side-effects or adverse reactions reported. Compliance: No problems identified. Effectiveness: Clinically acceptable.  Erlene Doyal Walters, CMA  04/30/2024  2:16 PM  Sign when Signing Visit Nursing Pain Medication Assessment:  Safety precautions to be maintained throughout the outpatient stay will include: orient to  surroundings, keep bed in low position, maintain call bell within reach at all times, provide assistance with transfer out of bed and ambulation.  Medication Inspection Compliance: Pill count conducted under aseptic conditions, in front of the patient. Neither the pills nor the bottle was removed from the patient's sight at any time. Once count was completed pills were immediately returned to the patient in their original bottle.  Medication: Tramadol  (Ultram ) Pill/Patch Count: 51 of 60 pills/patches remain Pill/Patch Appearance: Markings consistent with prescribed medication Bottle Appearance: Standard pharmacy container. Clearly labeled. Filled Date: 67 / 27 / 2025 Last Medication intake:  Today    UDS:  Summary  Date Value Ref Range Status  11/05/2023 FINAL  Final    Comment:    ==================================================================== [employer] Select 13 (MW) ==================================================================== Test                             Result       Flag       Units  Drug Present and Declared for Prescription Verification   Tramadol                        >4098        EXPECTED   ng/mg creat   O-Desmethyltramadol            >4098        EXPECTED   ng/mg creat   N-Desmethyltramadol            >4098        EXPECTED   ng/mg creat    Source of tramadol  is a prescription medication. O-desmethyltramadol    and N-desmethyltramadol are expected metabolites of tramadol .  ==================================================================== Test                      Result    Flag   Units      Ref Range   Creatinine              122              mg/dL      >=79 ==================================================================== Declared Medications:  The flagging and interpretation on this report are based on the  following declared medications.  Unexpected results may arise from  inaccuracies in the declared medications.   **Note: The testing scope of  this panel includes these medications:   Tramadol  (Ultram )   **Note: The testing scope of this panel does not include the  following reported medications:   Celecoxib  (Celebrex )  Cyclobenzaprine  (Flexeril )  Diclofenac  (Voltaren )  Losartan  (Cozaar )  Nortriptyline  (Pamelor )  Pregabalin  (Lyrica )  Semaglutide  (Wegovy ) ==================================================================== For clinical consultation, please call [phone]. ====================================================================     No results found for: CBDTHCR No results found for: D8THCCBX No results found for: D9THCCBX  ROS  Constitutional: Denies any fever or chills Gastrointestinal: No reported hemesis, hematochezia, vomiting, or acute GI distress Musculoskeletal: low back pain Neurological: No reported episodes of acute onset apraxia, aphasia, dysarthria, agnosia, amnesia, paralysis, loss of coordination, or loss of consciousness  Medication Review  celecoxib , cyclobenzaprine , diclofenac , losartan , nortriptyline , pregabalin , semaglutide -weight management, and traMADol   History Review  Allergy: Erica Walters has no known allergies. Drug: Erica Walters  reports no history of drug use. Alcohol:  reports current alcohol  use of about 3.0 standard drinks of alcohol per week. Tobacco:  reports that she has been smoking cigarettes. She has a 13 pack-year smoking history. She has never used smokeless tobacco. Social: Erica Walters  reports that she has been smoking cigarettes. She has a 13 pack-year smoking history. She has never used smokeless tobacco. She reports current alcohol use of about 3.0 standard drinks of alcohol per week. She reports that she does not use drugs. Medical:  has a past medical history of Allergy, Arthritis, DJD (degenerative joint disease), and Hyperlipidemia. Surgical: Erica Walters  has a past surgical history that includes Total abdominal hysterectomy w/ bilateral  salpingoophorectomy; Colonoscopy (12/04/2017); and Colonoscopy (2020). Family: family history includes Colon cancer in her paternal grandfather; Colon polyps (age of onset: 48) in her paternal grandfather.  Laboratory Chemistry Profile   Renal Lab Results  Component Value Date   BUN 17 02/15/2023   CREATININE 0.66 02/15/2023   BCR SEE NOTE: 02/15/2023   GFRAA 97 01/23/2019   GFRNONAA 84 01/23/2019    Hepatic Lab Results  Component Value Date   AST 32 02/15/2023   ALT 44 (H) 02/15/2023   ALBUMIN 4.1 07/19/2015   ALKPHOS 83 07/19/2015   LIPASE 30 07/19/2015    Electrolytes Lab Results  Component Value Date   NA 141 02/15/2023   K 4.5 02/15/2023   CL 105 02/15/2023   CALCIUM  9.3 02/15/2023    Bone Lab Results  Component Value Date   VD25OH 31 01/23/2019    Inflammation (CRP: Acute Phase) (ESR: Chronic Phase) Lab Results  Component Value Date   CRP 4.7 12/25/2018   ESRSEDRATE 14 12/25/2018         Note: Above Lab results reviewed.  Recent Imaging Review  DG PAIN CLINIC C-ARM 1-60 MIN NO REPORT Fluoro was used, but no Radiologist interpretation will be provided.  Please refer to NOTES tab for provider progress note. Note: Reviewed        Physical Exam  Vitals: BP 136/78 (BP Location: Right Arm, Patient Position: Sitting, Cuff Size: Normal)   Pulse (!) 109   Temp 97.8 F (36.6 C) (Temporal)   Resp 18   Ht 5' 6 (1.676 m)   Wt 196 lb (88.9 kg)   SpO2 97%   BMI 31.64 kg/m  BMI: Estimated body mass index is 31.64 kg/m as calculated from the following:   Height as of this encounter: 5' 6 (1.676 m).   Weight as of this encounter: 196 lb (88.9 kg). Ideal: Ideal body weight: 59.3 kg (130 lb 11.7 oz) Adjusted ideal body weight: 71.1 kg (156 lb 13.4 oz) General appearance: Well nourished, well developed, and well hydrated. In no apparent acute distress Mental status: Alert, oriented x 3 (person, place, & time)       Respiratory: No evidence of acute  respiratory distress Eyes: PERLA  Musculoskeletal: +LBP Assessment   Diagnosis Status  1. Lumbar disc herniation with radiculopathy   2. Chronic pain syndrome   3. Medication management   4. Neuropathy involving both lower extremities   5. Lumbar facet arthropathy   6. Cervical radiculopathy   7. Back pain, unspecified back location, unspecified back pain laterality, unspecified chronicity    Controlled Controlled Controlled   Updated Problems: No problems updated.  Plan of Care  Problem-specific:  Assessment and Plan    Chronic low back pain with lumbar radiculopathy and chronic pain syndrome Chronic low back pain with radiculopathy to groin and knee, worsened by work activities.  Current medications include tramadol , pregabalin , nortriptyline , and Flexeril . No side effects from tramadol  or nortriptyline . Previous injections ineffective and costly. - Refilled nortriptyline  and tramadol  prescriptions. - Sent tramadol  prescription to pharmacy for every eight hours dosing. - Advised to check Celebrex  and Flexeril  refills at home and contact if needed.  Chronic pain syndrome: Patient's pain is controlled with tramadol , will continue on current medication regimen. Prescribing drug monitoring (PMP) reviewed, findings consistent with prescribe medication and no evidence of narcotic misuse or abuse. Urine drug screening  (UDS) up to date. No side effects or adverse reaction reported to medication. Schedule follow up in 90 days for medication management.    Medication management: Meds ordered this encounter  Medications   traMADol  (ULTRAM ) 50 MG tablet    Sig: Take 1 tablet (50 mg total) by mouth every 8 (eight) hours as needed.    Dispense:  90 tablet    Refill:  5   nortriptyline  (PAMELOR ) 50 MG capsule    Sig: Take 1 capsule (50 mg total) by mouth at bedtime.    Dispense:  30 capsule    Refill:  5        Ms. Shatoya Edelen has a current medication list which includes the  following long-term medication(s): losartan , pregabalin , and nortriptyline .  Pharmacotherapy (Medications Ordered): Meds ordered this encounter  Medications   traMADol  (ULTRAM ) 50 MG tablet    Sig: Take 1 tablet (50 mg total) by mouth every 8 (eight) hours as needed.    Dispense:  90 tablet    Refill:  5   nortriptyline  (PAMELOR ) 50 MG capsule    Sig: Take 1 capsule (50 mg total) by mouth at bedtime.    Dispense:  30 capsule    Refill:  5   Orders:  No orders of the defined types were placed in this encounter.       Return in about 3 months (around 07/31/2024) for (F2F), (MM), Erica Blanch NP.    Recent Visits No visits were found meeting these conditions. Showing recent visits within past 90 days and meeting all other requirements Today's Visits Date Type Provider Dept  04/30/24 Office Visit Kharis Lapenna K, NP [facility]  Showing today's visits and meeting all other requirements Future Appointments Date Type Provider Dept  07/27/24 Appointment Emmons Toth K, NP [facility]  Showing future appointments within next 90 days and meeting all other requirements  I discussed the assessment and treatment plan with the patient. The patient was provided an opportunity to ask questions and all were answered. The patient agreed with the plan and demonstrated an understanding of the instructions.  Patient advised to call back or seek an in-person evaluation if the symptoms or condition worsens.  I personally spent a total of 30 minutes in the care of the patient today including preparing to see the patient, getting/reviewing separately obtained history, performing a medically appropriate exam/evaluation, counseling and educating, placing orders, referring and communicating with other health care professionals, documenting clinical information in the EHR, independently interpreting results, communicating results, and coordinating care.   Note by: Onna Nodal K Savina Olshefski, NP (TTS  and AI technology used. I apologize for any typographical errors that were not detected and corrected.) Date: 04/30/2024; Time: 2:35 PM

## 2024-05-04 ENCOUNTER — Other Ambulatory Visit: Payer: Self-pay | Admitting: *Deleted

## 2024-05-04 ENCOUNTER — Encounter: Payer: Self-pay | Admitting: Student in an Organized Health Care Education/Training Program

## 2024-05-04 NOTE — Progress Notes (Unsigned)
 Safety precautions to be maintained throughout the outpatient stay will include: orient to surroundings, keep bed in low position, maintain call bell within reach at all times, provide assistance with transfer out of bed and ambulation.

## 2024-05-15 ENCOUNTER — Other Ambulatory Visit: Payer: Self-pay | Admitting: Nurse Practitioner

## 2024-05-25 ENCOUNTER — Other Ambulatory Visit: Payer: Self-pay | Admitting: *Deleted

## 2024-05-25 MED ORDER — CELECOXIB 100 MG PO CAPS: 100.0000 mg | ORAL_CAPSULE | Freq: Two times a day (BID) | ORAL | 5 refills | Status: AC

## 2024-05-25 NOTE — Progress Notes (Unsigned)
 Safety precautions to be maintained throughout the outpatient stay will include: orient to surroundings, keep bed in low position, maintain call bell within reach at all times, provide assistance with transfer out of bed and ambulation.

## 2024-07-21 ENCOUNTER — Encounter: Admitting: Nurse Practitioner

## 2024-07-23 ENCOUNTER — Ambulatory Visit: Attending: Nurse Practitioner | Admitting: Nurse Practitioner

## 2024-07-23 ENCOUNTER — Encounter: Payer: Self-pay | Admitting: Nurse Practitioner

## 2024-07-23 DIAGNOSIS — G894 Chronic pain syndrome: Secondary | ICD-10-CM | POA: Diagnosis not present

## 2024-07-23 DIAGNOSIS — Z79899 Other long term (current) drug therapy: Secondary | ICD-10-CM | POA: Diagnosis not present

## 2024-07-23 DIAGNOSIS — M549 Dorsalgia, unspecified: Secondary | ICD-10-CM

## 2024-07-23 DIAGNOSIS — M5116 Intervertebral disc disorders with radiculopathy, lumbar region: Secondary | ICD-10-CM | POA: Diagnosis not present

## 2024-07-23 DIAGNOSIS — M5412 Radiculopathy, cervical region: Secondary | ICD-10-CM

## 2024-07-23 DIAGNOSIS — M47816 Spondylosis without myelopathy or radiculopathy, lumbar region: Secondary | ICD-10-CM

## 2024-07-23 DIAGNOSIS — G5793 Unspecified mononeuropathy of bilateral lower limbs: Secondary | ICD-10-CM

## 2024-07-23 MED ORDER — DICLOFENAC SODIUM 50 MG PO TBEC: 50.0000 mg | DELAYED_RELEASE_TABLET | Freq: Two times a day (BID) | ORAL | 1 refills | Status: AC

## 2024-07-23 MED ORDER — PREGABALIN 100 MG PO CAPS: 100.0000 mg | ORAL_CAPSULE | Freq: Three times a day (TID) | ORAL | 5 refills | Status: AC

## 2024-07-23 NOTE — Progress Notes (Signed)
 PROVIDER NOTE: Interpretation of information contained herein should be left to medically-trained personnel. Specific patient instructions are provided elsewhere under Patient Instructions section of medical record. This document was created in part using AI and STT-dictation technology, any transcriptional errors that may result from this process are unintentional.  Patient: Erica Walters  Service: E/M   PCP: Duanne Butler DASEN, MD  DOB: [birth year]  DOS: 07/23/2024  Provider: Emmy MARLA Blanch, NP  MRN: [id number]  Delivery: Virtual Visit  Specialty: Interventional Pain Management  Type: Established Patient  Setting: Ambulatory outpatient facility  Specialty designation: 09  Referring Prov.: Duanne Butler DASEN, MD  Location: Remote location       Virtual Encounter - Pain Management PROVIDER NOTE: Information contained herein reflects review and annotations entered in association with encounter. Interpretation of such information and data should be left to medically-trained personnel. Information provided to patient can be located elsewhere in the medical record under Patient Instructions. Document created using STT-dictation technology, any transcriptional errors that may result from process are unintentional.    Contact & Pharmacy Preferred: [phone] Home: [phone] (home) Mobile: [phone] (mobile) E-mail: [email or web address] [phone] [place], [place] - [address] AT [facility] [address] [place] [address] Phone: [phone] Fax: [phone]   Pre-screening  Erica Walters offered in-person vs virtual encounter. She indicated preferring virtual for this encounter.   Reason COVID-19*  Social distancing based on CDC and AMA recommendations.   I contacted Erica Walters on 07/23/2024 via telephone.      I clearly identified myself as Emmy MARLA Blanch, NP. I verified that I was speaking with the correct person using two identifiers (Name:  Erica Walters, and date of birth: 19-Oct-1965).  Consent I sought verbal advanced consent from Erica Walters for virtual visit interactions. I informed Erica Walters of possible security and privacy concerns, risks, and limitations associated with providing not-in-person medical evaluation and management services. I also informed Erica Walters of the availability of in-person appointments. Finally, I informed her that there would be a charge for the virtual visit and that she could be  personally, fully or partially, financially responsible for it. Erica Walters expressed understanding and agreed to proceed.   Historic Elements   Erica Walters is a 59 y.o. year old, female patient evaluated today after our last contact on 05/15/2024. Erica Walters  has a past medical history of Allergy, Arthritis, DJD (degenerative joint disease), and Hyperlipidemia. She also  has a past surgical history that includes Total abdominal hysterectomy w/ bilateral salpingoophorectomy; Colonoscopy (12/04/2017); and Colonoscopy (2020). Erica Walters has a current medication list which includes the following prescription(s): celecoxib , cyclobenzaprine , losartan , nortriptyline , wegovy , tramadol , diclofenac , and pregabalin . She  reports that she has been smoking cigarettes. She has a 13 pack-year smoking history. She has never used smokeless tobacco. She reports current alcohol use of about 3.0 standard drinks of alcohol per week. She reports that she does not use drugs. Erica Walters has no known allergies.  BMI: Estimated body mass index is 31.64 kg/m as calculated from the following:   Height as of 04/30/24: 5' 6 (1.676 m).   Weight as of 04/30/24: 196 lb (88.9 kg). Last encounter: 04/30/2024. Last procedure: Visit date not found.  HPI  Today, she is being contacted for medication management. No change in medical history since last visit.  Patient's pain is at baseline.  Patient continues multimodal pain regimen as  prescribed.  States that it provides pain relief  and improvement in functional status. She experiences chronic pain primarily located in her lower back, radiating into her groin area and down to her knee. She is currently taking tramadol , pregabalin , nortriptyline , and occasionally uses Flexeril  and Celebrex  for pain management.  Pharmacotherapy Assessment  Pregabalin  (Lyrica ) 100 mg capsule 3 times daily for neuropathic pain Diclofenac  (Voltaren ) 50 mg tablet 2 times daily Monitoring: [place] PMP: PDMP reviewed during this encounter.       Pharmacotherapy: No side-effects or adverse reactions reported. Compliance: No problems identified. Effectiveness: Clinically acceptable. Plan: Refer to POC.  UDS:  Summary  Date Value Ref Range Status  11/05/2023 FINAL  Final    Comment:    ==================================================================== [employer] Select 13 (MW) ==================================================================== Test                             Result       Flag       Units  Drug Present and Declared for Prescription Verification   Tramadol                        >4098        EXPECTED   ng/mg creat   O-Desmethyltramadol            >4098        EXPECTED   ng/mg creat   N-Desmethyltramadol            >4098        EXPECTED   ng/mg creat    Source of tramadol  is a prescription medication. O-desmethyltramadol    and N-desmethyltramadol are expected metabolites of tramadol .  ==================================================================== Test                      Result    Flag   Units      Ref Range   Creatinine              122              mg/dL      >=79 ==================================================================== Declared Medications:  The flagging and interpretation on this report are based on the  following declared medications.  Unexpected results may arise from  inaccuracies in the declared medications.   **Note: The testing scope of this  panel includes these medications:   Tramadol  (Ultram )   **Note: The testing scope of this panel does not include the  following reported medications:   Celecoxib  (Celebrex )  Cyclobenzaprine  (Flexeril )  Diclofenac  (Voltaren )  Losartan  (Cozaar )  Nortriptyline  (Pamelor )  Pregabalin  (Lyrica )  Semaglutide  (Wegovy ) ==================================================================== For clinical consultation, please call [phone]. ====================================================================    No results found for: CBDTHCR, KATHLYNE, D9THCCBX  Laboratory Chemistry Profile   Renal Lab Results  Component Value Date   BUN 17 02/15/2023   CREATININE 0.66 02/15/2023   BCR SEE NOTE: 02/15/2023   GFRAA 97 01/23/2019   GFRNONAA 84 01/23/2019    Hepatic Lab Results  Component Value Date   AST 32 02/15/2023   ALT 44 (H) 02/15/2023   ALBUMIN 4.1 07/19/2015   ALKPHOS 83 07/19/2015   LIPASE 30 07/19/2015    Electrolytes Lab Results  Component Value Date   NA 141 02/15/2023   K 4.5 02/15/2023   CL 105 02/15/2023   CALCIUM  9.3 02/15/2023    Bone Lab Results  Component Value Date   VD25OH 31 01/23/2019    Inflammation (CRP: Acute Phase) (ESR: Chronic  Phase) Lab Results  Component Value Date   CRP 4.7 12/25/2018   ESRSEDRATE 14 12/25/2018         Note: Above Lab results reviewed.  Imaging  DG PAIN CLINIC C-ARM 1-60 MIN NO REPORT Fluoro was used, but no Radiologist interpretation will be provided.  Please refer to NOTES tab for provider progress note.  Assessment  The primary encounter diagnosis was Chronic pain syndrome. Diagnoses of Medication management, Neuropathy involving both lower extremities, Cervical radiculopathy, Back pain, unspecified back location, unspecified back pain laterality, unspecified chronicity, Lumbar disc herniation with radiculopathy, and Lumbar facet arthropathy were also pertinent to this visit.  Plan of Care   Problem-specific:   Medication management: Patient's pain is controlled with Pregabalin  and diclofenac , will continue on current medication regimen. Prescribing drug monitoring (PMP) reviewed, findings consistent with prescribe medication and no evidence of narcotic misuse or abuse. Urine drug screening (UDS) up to date. No side effects or adverse reaction reported to medication. Schedule follow up in 30 days for medication management.   Ms. Gazella Anglin has a current medication list which includes the following long-term medication(s): losartan , nortriptyline , and pregabalin .  Pharmacotherapy (Medications Ordered): Meds ordered this encounter  Medications   diclofenac  (VOLTAREN ) 50 MG EC tablet    Sig: Take 1 tablet (50 mg total) by mouth 2 (two) times daily.    Dispense:  60 tablet    Refill:  1   pregabalin  (LYRICA ) 100 MG capsule    Sig: Take 1 capsule (100 mg total) by mouth 3 (three) times daily.    Dispense:  90 capsule    Refill:  5    Fill one day early if pharmacy is closed on scheduled refill date. May substitute for generic if available.   Orders:  No orders of the defined types were placed in this encounter.  Follow-up plan:   Return in about 1 month (around 08/22/2024) for (F2F), (MM), Emmy Blanch NP.         Recent Visits Date Type Provider Dept  04/30/24 Office Visit Wrigley Plasencia K, NP [facility]  Showing recent visits within past 90 days and meeting all other requirements Today's Visits Date Type Provider Dept  07/23/24 Office Visit Avante Carneiro K, NP [facility]  Showing today's visits and meeting all other requirements Future Appointments No visits were found meeting these conditions. Showing future appointments within next 90 days and meeting all other requirements  I discussed the assessment and treatment plan with the patient. The patient was provided an opportunity to ask questions and all were answered. The patient agreed with  the plan and demonstrated an understanding of the instructions.  Patient advised to call back or seek an in-person evaluation if the symptoms or condition worsens.  Duration of encounter: 30 minutes.  Note by: Emmy MARLA Blanch, NP Date: 07/23/2024; Time: 2:50 PM

## 2024-07-27 ENCOUNTER — Encounter: Admitting: Nurse Practitioner
